# Patient Record
Sex: Female | Born: 1970
Health system: Southern US, Community
[De-identification: ages and names within clinical notes are randomized; demographics above are authoritative.]

## PROBLEM LIST (undated history)

## (undated) DIAGNOSIS — C801 Malignant (primary) neoplasm, unspecified: Secondary | ICD-10-CM

## (undated) DIAGNOSIS — Z9221 Personal history of antineoplastic chemotherapy: Secondary | ICD-10-CM

## (undated) DIAGNOSIS — K219 Gastro-esophageal reflux disease without esophagitis: Secondary | ICD-10-CM

## (undated) DIAGNOSIS — R002 Palpitations: Secondary | ICD-10-CM

## (undated) DIAGNOSIS — R0600 Dyspnea, unspecified: Secondary | ICD-10-CM

## (undated) DIAGNOSIS — T8859XA Other complications of anesthesia, initial encounter: Secondary | ICD-10-CM

## (undated) DIAGNOSIS — R7303 Prediabetes: Secondary | ICD-10-CM

## (undated) DIAGNOSIS — Z923 Personal history of irradiation: Secondary | ICD-10-CM

## (undated) DIAGNOSIS — E669 Obesity, unspecified: Secondary | ICD-10-CM

## (undated) DIAGNOSIS — E119 Type 2 diabetes mellitus without complications: Secondary | ICD-10-CM

## (undated) HISTORY — PX: BREAST LUMPECTOMY: SHX2

## (undated) HISTORY — PX: OTHER SURGICAL HISTORY: SHX169

## (undated) HISTORY — DX: Malignant (primary) neoplasm, unspecified: C80.1

## (undated) HISTORY — PX: DILATION AND CURETTAGE OF UTERUS: SHX78

## (undated) HISTORY — DX: Palpitations: R00.2

## (undated) HISTORY — PX: BREAST SURGERY: SHX581

## (undated) HISTORY — PX: CARDIAC CATHETERIZATION: SHX172

## (undated) HISTORY — PX: CHOLECYSTECTOMY: SHX55

## (undated) HISTORY — PX: HERNIA REPAIR: SHX51

---

## 1998-08-09 ENCOUNTER — Encounter: Admission: RE | Admit: 1998-08-09 | Discharge: 1998-11-07 | Payer: Self-pay | Admitting: Family Medicine

## 2007-09-08 ENCOUNTER — Other Ambulatory Visit: Admission: RE | Admit: 2007-09-08 | Discharge: 2007-09-08 | Payer: Self-pay | Admitting: Unknown Physician Specialty

## 2010-09-12 ENCOUNTER — Other Ambulatory Visit (HOSPITAL_COMMUNITY): Payer: Self-pay | Admitting: *Deleted

## 2010-09-12 ENCOUNTER — Other Ambulatory Visit (HOSPITAL_COMMUNITY): Payer: Self-pay | Admitting: Family Medicine

## 2010-09-12 DIAGNOSIS — Z139 Encounter for screening, unspecified: Secondary | ICD-10-CM

## 2010-09-13 ENCOUNTER — Ambulatory Visit (HOSPITAL_COMMUNITY)
Admission: RE | Admit: 2010-09-13 | Discharge: 2010-09-13 | Disposition: A | Discharge status: Home / Self Care | Payer: Self-pay | Source: Ambulatory Visit | Attending: *Deleted | Admitting: *Deleted

## 2010-09-13 DIAGNOSIS — Z1231 Encounter for screening mammogram for malignant neoplasm of breast: Secondary | ICD-10-CM | POA: Insufficient documentation

## 2010-09-13 DIAGNOSIS — Z139 Encounter for screening, unspecified: Secondary | ICD-10-CM

## 2010-09-14 ENCOUNTER — Other Ambulatory Visit: Payer: Self-pay | Admitting: Family Medicine

## 2010-09-14 DIAGNOSIS — R928 Other abnormal and inconclusive findings on diagnostic imaging of breast: Secondary | ICD-10-CM

## 2010-09-19 ENCOUNTER — Ambulatory Visit (HOSPITAL_COMMUNITY)
Admission: RE | Admit: 2010-09-19 | Discharge: 2010-09-19 | Disposition: A | Discharge status: Home / Self Care | Payer: PRIVATE HEALTH INSURANCE | Source: Ambulatory Visit | Attending: Family Medicine | Admitting: Family Medicine

## 2010-09-19 ENCOUNTER — Other Ambulatory Visit: Payer: Self-pay | Admitting: Family Medicine

## 2010-09-19 DIAGNOSIS — R928 Other abnormal and inconclusive findings on diagnostic imaging of breast: Secondary | ICD-10-CM

## 2010-09-19 DIAGNOSIS — Z803 Family history of malignant neoplasm of breast: Secondary | ICD-10-CM | POA: Insufficient documentation

## 2010-09-19 DIAGNOSIS — N63 Unspecified lump in unspecified breast: Secondary | ICD-10-CM | POA: Insufficient documentation

## 2010-09-21 ENCOUNTER — Other Ambulatory Visit (HOSPITAL_COMMUNITY): Payer: Self-pay | Admitting: Family Medicine

## 2010-09-21 DIAGNOSIS — N6310 Unspecified lump in the right breast, unspecified quadrant: Secondary | ICD-10-CM

## 2010-09-26 ENCOUNTER — Ambulatory Visit (HOSPITAL_COMMUNITY)
Admission: RE | Admit: 2010-09-26 | Discharge: 2010-09-26 | Disposition: A | Discharge status: Home / Self Care | Payer: PRIVATE HEALTH INSURANCE | Source: Ambulatory Visit | Attending: Family Medicine | Admitting: Family Medicine

## 2010-09-26 ENCOUNTER — Other Ambulatory Visit (HOSPITAL_COMMUNITY): Payer: Self-pay | Admitting: Family Medicine

## 2010-09-26 ENCOUNTER — Other Ambulatory Visit: Payer: Self-pay | Admitting: Radiology

## 2010-09-26 DIAGNOSIS — N6310 Unspecified lump in the right breast, unspecified quadrant: Secondary | ICD-10-CM

## 2010-09-26 DIAGNOSIS — N63 Unspecified lump in unspecified breast: Secondary | ICD-10-CM | POA: Insufficient documentation

## 2010-11-08 ENCOUNTER — Other Ambulatory Visit (HOSPITAL_COMMUNITY): Payer: Self-pay | Admitting: Family Medicine

## 2010-11-08 ENCOUNTER — Other Ambulatory Visit (HOSPITAL_COMMUNITY): Payer: Self-pay | Admitting: *Deleted

## 2010-11-08 DIAGNOSIS — R928 Other abnormal and inconclusive findings on diagnostic imaging of breast: Secondary | ICD-10-CM

## 2011-03-28 ENCOUNTER — Other Ambulatory Visit (HOSPITAL_COMMUNITY): Payer: Self-pay | Admitting: Family Medicine

## 2011-03-28 DIAGNOSIS — Z09 Encounter for follow-up examination after completed treatment for conditions other than malignant neoplasm: Secondary | ICD-10-CM

## 2011-04-17 ENCOUNTER — Ambulatory Visit (HOSPITAL_COMMUNITY)
Admission: RE | Admit: 2011-04-17 | Discharge: 2011-04-17 | Disposition: A | Discharge status: Home / Self Care | Payer: PRIVATE HEALTH INSURANCE | Source: Ambulatory Visit | Attending: Family Medicine | Admitting: Family Medicine

## 2011-04-17 ENCOUNTER — Other Ambulatory Visit (HOSPITAL_COMMUNITY): Payer: Self-pay | Admitting: Family Medicine

## 2011-04-17 DIAGNOSIS — Z09 Encounter for follow-up examination after completed treatment for conditions other than malignant neoplasm: Secondary | ICD-10-CM

## 2011-04-17 DIAGNOSIS — N63 Unspecified lump in unspecified breast: Secondary | ICD-10-CM | POA: Insufficient documentation

## 2011-10-18 ENCOUNTER — Other Ambulatory Visit (HOSPITAL_COMMUNITY): Payer: Self-pay | Admitting: Family Medicine

## 2011-10-18 DIAGNOSIS — Z09 Encounter for follow-up examination after completed treatment for conditions other than malignant neoplasm: Secondary | ICD-10-CM

## 2011-11-06 ENCOUNTER — Ambulatory Visit (HOSPITAL_COMMUNITY)
Admission: RE | Admit: 2011-11-06 | Discharge: 2011-11-06 | Disposition: A | Discharge status: Home / Self Care | Payer: PRIVATE HEALTH INSURANCE | Source: Ambulatory Visit | Attending: Family Medicine | Admitting: Family Medicine

## 2011-11-06 DIAGNOSIS — N63 Unspecified lump in unspecified breast: Secondary | ICD-10-CM | POA: Insufficient documentation

## 2011-11-06 DIAGNOSIS — Z09 Encounter for follow-up examination after completed treatment for conditions other than malignant neoplasm: Secondary | ICD-10-CM | POA: Insufficient documentation

## 2012-10-14 ENCOUNTER — Other Ambulatory Visit (HOSPITAL_COMMUNITY): Payer: Self-pay | Admitting: *Deleted

## 2012-10-14 DIAGNOSIS — Z1231 Encounter for screening mammogram for malignant neoplasm of breast: Secondary | ICD-10-CM

## 2012-11-09 ENCOUNTER — Ambulatory Visit (HOSPITAL_COMMUNITY): Payer: Self-pay

## 2012-11-09 ENCOUNTER — Ambulatory Visit (HOSPITAL_COMMUNITY)
Admission: RE | Admit: 2012-11-09 | Discharge: 2012-11-09 | Disposition: A | Discharge status: Home / Self Care | Payer: Self-pay | Source: Ambulatory Visit | Attending: *Deleted | Admitting: *Deleted

## 2012-11-09 DIAGNOSIS — Z1231 Encounter for screening mammogram for malignant neoplasm of breast: Secondary | ICD-10-CM

## 2014-01-25 ENCOUNTER — Other Ambulatory Visit (HOSPITAL_COMMUNITY): Payer: Self-pay | Admitting: *Deleted

## 2014-01-25 DIAGNOSIS — Z1231 Encounter for screening mammogram for malignant neoplasm of breast: Secondary | ICD-10-CM

## 2014-02-02 ENCOUNTER — Ambulatory Visit (HOSPITAL_COMMUNITY)
Admission: RE | Admit: 2014-02-02 | Discharge: 2014-02-02 | Disposition: A | Discharge status: Home / Self Care | Payer: Self-pay | Source: Ambulatory Visit | Attending: *Deleted | Admitting: *Deleted

## 2014-02-02 DIAGNOSIS — Z1231 Encounter for screening mammogram for malignant neoplasm of breast: Secondary | ICD-10-CM

## 2015-05-04 ENCOUNTER — Other Ambulatory Visit (HOSPITAL_COMMUNITY): Payer: Self-pay | Admitting: Nurse Practitioner

## 2015-05-04 DIAGNOSIS — Z1231 Encounter for screening mammogram for malignant neoplasm of breast: Secondary | ICD-10-CM

## 2015-05-11 ENCOUNTER — Ambulatory Visit (HOSPITAL_COMMUNITY)
Admission: RE | Admit: 2015-05-11 | Discharge: 2015-05-11 | Disposition: A | Discharge status: Home / Self Care | Payer: Self-pay | Source: Ambulatory Visit | Attending: Nurse Practitioner | Admitting: Nurse Practitioner

## 2015-05-11 DIAGNOSIS — Z1231 Encounter for screening mammogram for malignant neoplasm of breast: Secondary | ICD-10-CM

## 2016-03-19 ENCOUNTER — Encounter: Payer: Self-pay | Admitting: Physician Assistant

## 2016-03-19 ENCOUNTER — Ambulatory Visit (INDEPENDENT_AMBULATORY_CARE_PROVIDER_SITE_OTHER): Payer: Self-pay | Admitting: Physician Assistant

## 2016-03-19 VITALS — BP 132/88 | HR 91 | Temp 99.2°F | Ht 66.0 in | Wt 366.2 lb

## 2016-03-19 DIAGNOSIS — J309 Allergic rhinitis, unspecified: Secondary | ICD-10-CM

## 2016-03-19 DIAGNOSIS — E781 Pure hyperglyceridemia: Secondary | ICD-10-CM | POA: Insufficient documentation

## 2016-03-19 DIAGNOSIS — I10 Essential (primary) hypertension: Secondary | ICD-10-CM

## 2016-03-19 DIAGNOSIS — K219 Gastro-esophageal reflux disease without esophagitis: Secondary | ICD-10-CM

## 2016-03-19 DIAGNOSIS — R002 Palpitations: Secondary | ICD-10-CM

## 2016-03-19 MED ORDER — RANITIDINE HCL 300 MG PO TABS: 300.0000 mg | ORAL_TABLET | Freq: Every day | ORAL | 6 refills | Status: DC

## 2016-03-19 MED ORDER — FLUTICASONE PROPIONATE 50 MCG/ACT NA SUSP: 2.0000 | Freq: Every day | NASAL | 12 refills | Status: DC

## 2016-03-19 MED ORDER — LORATADINE 10 MG PO TABS: 10.0000 mg | ORAL_TABLET | Freq: Every day | ORAL | 11 refills | Status: DC

## 2016-03-19 MED ORDER — METOPROLOL TARTRATE 25 MG PO TABS: 25.0000 mg | ORAL_TABLET | Freq: Two times a day (BID) | ORAL | 6 refills | Status: DC

## 2016-03-19 NOTE — Patient Instructions (Signed)
Gluten-Free Diet for Celiac Disease Gluten is a protein found in wheat, rye, barley, and triticale (a cross between wheat and rye) grains. People with celiac disease need to have a gluten-free diet. With celiac disease, gluten interferes with the absorption of food and may also cause intestinal injury.  Strict compliance is important even during symptom-free periods. This means eliminating all foods with gluten from your diet permanently. This requires some significant changes but is very manageable. WHAT DO I NEED TO KNOW ABOUT A GLUTEN-FREE DIET?  Look for items labeled with "GF." Looking for GF will make it easier to identify products that are safe to eat.  Read all labels. Gluten may have been added as a minor ingredient where least expected, such as in shredded cheeses or ice creams. Always check food labels and investigate questionable ingredients. Talk to your dietitian or health care provider if you have questions about certain foods or need help finding GF foods.  Check when in doubt. If you are not sure whether an ingredient contains gluten, check with the manufacturer. Note that some manufacturers may change ingredients without notice. Always read labels.   Know how food is prepared. Since flour and cereal products are often used in the preparation of foods, it is important to be aware of the methods of preparation used, as well as the ingredients in the foods themselves. This is especially true when you are dining out. Ask restaurants if they have a gluten-free menu.  Watch for cross-contamination. Cross-contamination occurs when gluten-free foods come into contact with foods that contain gluten. It often happens during the manufacturing process. Always check the ingredient list and for warnings on packages, such as "may contain gluten."  Eat a balanced diet. It is important to still get enough fiber, iron, and B vitamins in your diet. Look for enriched whole grain gluten-free products  and continue to eat a well-balanced diet of the important non-grain items, such as vegetables, fruit, lean proteins, legumes, and dairy.  Consider taking a gluten-free multivitamin and mineral supplement. Discuss this with your health care provider. WHAT KEY WORDS HELP IDENTIFY GLUTEN? Know key words to help identify gluten. A dietitian can help you identify possible harmful ingredients in the foods you normally eat. Words to check for on food labels include:   Flour, enriched flour, bromated flour, white flour, durum flour, graham flour, phosphated flour, self-rising flour, semolina, or farina.  Starch, dextrin, modified food starch, or cereal.  Thickening, fillers, or emulsifiers.  Any kind of malt flavoring, extract, or syrup (malt is made from barley and includes malt vinegar, malted milk, and malted beverages).  Hydrolyzed vegetable protein. WHAT FOODS CAN I EAT? Below is a list of common foods that are allowed with a gluten-free diet.  Grains Products made from the following flours or grains:amaranth,bean flours, 100% buckwheat flour, corn, millet, nut flours or meals, GF oats, quinoa, rice, sorghum, teff, any all-purpose 100% GF flour mix, rice wafers, pure cornmeal tortillas, popcorn, some crackers, some chips, and hot cereals made from cornmeal. Ask your dietitian which specific hot and cold cereals are allowed. Hominy, rice or wild rice, and special GF pasta. Some Asian rice noodles or bean noodles. Arrowroot starch, corn bran, corn flour, corn germ, cornmeal, corn starch, potato flour, potato starch flour, and rice bran. Rice flours: plain, brown, and sweet. Rice polish, soy flour, tapioca starch. Vegetables All plain, fresh, frozen, or canned vegetables.  Fruits All fresh, frozen, canned, dried fruits, and fruit juices.  Meats and Other   Protein Foods Meat, fish, poultry, or eggs prepared without added wheat, rye, barley, or triticale. Some luncheon meat and some frankfurters.  Pure meat. All aged cheese, most processed cheese products, some cottage cheese, and some cream cheese. Dried beans, dried peas, and lentils.  Dairy Milk and yogurt made with allowed ingredients.  Beverages Coffee (regular or decaffeinated), tea, herbal tea (read label to be sure that no wheat flour has been added). Carbonated beverages and some root beers. Wine, sake, and distilled spirits, such as gin, vodka, and whiskey. GF beers and GF ciders.  Sweetsand Desserts Sugar, honey, some syrups, molasses, jelly, jam, plain hard candy, marshmallows, gumdrops, homemade candies free of wheat, rye, barley, or triticale. Coconut. Custard, some pudding mixes, and homemade puddings from cornstarch, rice, and tapioca. Gelatin desserts, sorbets, frozen ice pops, and sherbet. Cake, cookies, and other desserts prepared with allowed flours. Some commercial ice creams. Ask your dietitian about specific brands of dessert that are allowed.  Fats and Oils Butter, margarine, vegetable oil, sour cream not containing modified food starch, whipping cream, shortening, lard, cream, and some mayonnaise. Some commercial salad dressings. Peanut butter.  Other Homemade broth and soups made with allowed ingredients; some canned or frozen soups. Any other combination or prepared foods that do not contain gluten. Monosodium glutamate (MSG). Cider, rice, and wine vinegar. Baking soda and baking powder. Certain soy sauces (Tamari). Ask your dietitian about specific brands that are allowed. Nuts, coconut, chocolate, and pure cocoa powder. Salt, pepper, herbs, spices, extracts, and food colorings. The items listed above may not be a complete list of allowed foods or beverages. Contact your dietitian for more options.  WHAT FOODS CAN I NOT EAT? Below is a list of common foods that are not allowed with a gluten-free diet.  Grains Barley, bran, bulgur, cracked wheat, graham, malt, matzo, wheat germ, and all wheat and rye cereals  including spelt and kamut. Avoid cereals containing malt as a flavoring, such as rice cereal. Also avoid regular noodles, spaghetti, macaroni, and most packaged rice mixes, and all others containing wheat, rye, barley, or triticale.  Vegetables Most creamed vegetables, most vegetables canned in sauces, and any vegetables prepared with wheat, rye, barley, or triticale.  Fruits Thickened or prepared fruits and some pie fillings.  Meats and Other Protein Sources Any meat or meat alternative containing wheat, rye, barley, or gluten stabilizers (such as some hot dogs, salami, cold cuts, or sausage). Bread-containing products, such as Swiss steak, croquettes, and meatloaf. Most tuna canned in vegetable broth, turkey with hydrolyzed vegetable protein (HVP) injected as part of the basting, and any cheese product containing oat gum as an ingredient. Seitan. Imitation fish. Dairy Commercial chocolate milk, which may have cereal added, and malted milk. Beverages Certain cereal beverages. Beer and ciders (unless GF), ale, malted milk, and some root beers. Sweetsand Desserts Commercial candies containing wheat, rye, barley, or triticale. Certain toffees are dusted with wheat flour. Chocolate-coated nuts, which are often rolled in flour. Cakes, cookies, doughnuts, and pastries that are prepared with wheat, barley, rye, or triticale flour. Some commercial ice creams, ice cream flavors which contain cookies, crumbs, or cheesecake. Ice cream cones. Commercially prepared mixes for cakes, cookies, and other desserts unless marked GF. Bread pudding and other puddings thickened with flour. Fats and Oils Some commercial salad dressings and sour cream containing modified food starch.  Condiments Some curry powder, some dry seasoning mixes, some gravy extracts, some meat sauces, some ketchup, some prepared mustard, horseradish. Other All soups containing wheat,   rye, barley, or triticale flour. Bouillon and bouillon  cubes that contain HVP. Combination or prepared foods that contain gluten. Some soy sauce, some chip dips, and some chewing gum. Yeast extract (contains barley). Caramel color (may contain malt). The items listed above may not be a complete list of foods and beverages to avoid. Contact your dietitian for more information.   This information is not intended to replace advice given to you by your health care provider. Make sure you discuss any questions you have with your health care provider.   Document Released: 06/17/2005 Document Revised: 07/08/2014 Document Reviewed: 04/21/2013 Elsevier Interactive Patient Education 2016 Elsevier Inc.  

## 2016-03-19 NOTE — Progress Notes (Signed)
BP 132/88   Pulse 91   Temp 99.2 F (37.3 C) (Oral)   Ht 5\' 6"  (1.676 m)   Wt (!) 366 lb 3.2 oz (166.1 kg)   BMI 59.11 kg/m    Subjective:    Patient ID: Shelby [name], female    DOB: 1971/02/03, 45 y.o.   MRN: [id number]  Shelby [name] is a 45 y.o. female presenting on 03/19/2016 for New Patient (Initial Visit) (Patient states that she is 18 days late on her menstrual cycle. She states that she took 2 pregnancy test and they were negative); Ear Pain (Bilateral ); and Headache   HPI This is a new patient coming to be established at [facility]. She has been followed through the [place] and cardiology at [facility] which was [place]. When they left earlier in the year she did not need to be seen by another cardiologist.  She has been stable, though not doing any regular exercise.  We have set a goal fer her to start walking 10 minutes at a slow pace twice daily and in the next month increase weekly her time or her distance.  Also she will be eliminating gluten due to GERD, bloating and weight gain.  She has chronic allergic rhinitis but does not routinely take her claritin or flonase. Will refill them and take regularly.  Also has more palpitations but did lower her metoprolol to once daily and it is not the extend release. Explained that the medication is not staying at a steady concentration to cover her, in addtion to her cardiopulmonary decomposition from not exercising. Will adjust the med to BID and recheck in one month. Has taken 2 pregnancy tests at home that were negative and has taken her Birth control on very routine basis. She is having some vaginal dryness, mood fluctuations, and hot flashes. We discussed that she may be entering the perimenopausal time. She is due for her well exam next month at the [facility]. I've encouraged her to discuss that with them at that time. We have discussed what happens to treat the symptoms  of menopause. It can range from estrogen replacement to indications for depression in addition to medicines to help vasomotor symptoms. We will address these as time goes on.  Relevant past medical, surgical, family and social history reviewed and updated as indicated. Interim medical history since our last visit reviewed. Allergies and medications reviewed and updated.   Data reviewed from any sources in EPIC.  Review of Systems  Constitutional: Positive for unexpected weight change. Negative for fatigue and fever.  HENT: Positive for postnasal drip and sneezing.   Eyes: Positive for itching.  Respiratory: Positive for shortness of breath. Negative for cough.   Cardiovascular: Positive for palpitations. Negative for chest pain and leg swelling.  Gastrointestinal: Negative.  Negative for abdominal pain.  Endocrine: Negative.   Genitourinary: Negative.  Negative for dysuria.  Musculoskeletal: Negative.   Skin: Negative.   Neurological: Negative.   Psychiatric/Behavioral: Positive for agitation and dysphoric mood.    Per HPI unless specifically indicated above  Social History   Social History  . Marital status: Legally Separated    Spouse name: N/A  . Number of children: N/A  . Years of education: N/A   Occupational History  . Not on file.   Social History Main Topics  . Smoking status: Never Smoker  . Smokeless tobacco: Never Used  . Alcohol use No  . Drug use: No  .  Sexual activity: Not on file   Other Topics Concern  . Not on file   Social History Narrative  . No narrative on file    Past Surgical History:  Procedure Laterality Date  . CHOLECYSTECTOMY    . HERNIA REPAIR      Family History  Problem Relation Age of Onset  . Adopted: Yes      Medication List       Accurate as of 03/19/16  2:57 PM. Always use your most recent med list.          aspirin EC 81 MG tablet Take 81 mg by mouth daily.   Fish Oil 1000 MG Caps Take 1,000 mg by mouth 2 (two)  times daily.   fluticasone 50 MCG/ACT nasal spray Commonly known as:  FLONASE Place 2 sprays into both nostrils daily.   loratadine 10 MG tablet Commonly known as:  CLARITIN Take 1 tablet (10 mg total) by mouth daily.   metoprolol tartrate 25 MG tablet Commonly known as:  LOPRESSOR Take 1 tablet (25 mg total) by mouth 2 (two) times daily.   norethindrone 0.35 MG tablet Commonly known as:  MICRONOR,CAMILA,ERRIN Take by mouth.   ranitidine 300 MG tablet Commonly known as:  ZANTAC Take 1 tablet (300 mg total) by mouth at bedtime.          Objective:    BP 132/88   Pulse 91   Temp 99.2 F (37.3 C) (Oral)   Ht 5\' 6"  (1.676 m)   Wt (!) 366 lb 3.2 oz (166.1 kg)   BMI 59.11 kg/m   Allergies  Allergen Reactions  . Codeine Swelling   Wt Readings from Last 3 Encounters:  03/19/16 (!) 366 lb 3.2 oz (166.1 kg)    Physical Exam  Constitutional: She is oriented to person, place, and time. She appears well-developed.  Non-toxic appearance.  Morbidly obese female  HENT:  Head: Normocephalic and atraumatic.  Eyes: Conjunctivae and EOM are normal. Pupils are equal, round, and reactive to light.  Neck: Normal range of motion. Neck supple.  Cardiovascular: Normal rate, regular rhythm, normal heart sounds and intact distal pulses.   Pulmonary/Chest: Effort normal and breath sounds normal.  Abdominal: Soft. Bowel sounds are normal.  Neurological: She is alert and oriented to person, place, and time. She has normal reflexes.  Skin: Skin is warm and dry. No rash noted.  Psychiatric: She has a normal mood and affect. Her behavior is normal. Judgment and thought content normal.        Assessment & Plan:   1. Morbid obesity, unspecified obesity type (Shelby [name]) Walk daily and gluten free meal planning  2. Palpitations - metoprolol tartrate (LOPRESSOR) 25 MG tablet; Take 1 tablet (25 mg total) by mouth 2 (two) times daily.  Dispense: 60 tablet; Refill: 6  3. Essential  hypertension Continue meds  4. Allergic rhinitis, unspecified allergic rhinitis type - fluticasone (FLONASE) 50 MCG/ACT nasal spray; Place 2 sprays into both nostrils daily.  Dispense: 16 g; Refill: 12 - loratadine (CLARITIN) 10 MG tablet; Take 1 tablet (10 mg total) by mouth daily.  Dispense: 30 tablet; Refill: 11  5. Gastroesophageal reflux disease without esophagitis - ranitidine (ZANTAC) 300 MG tablet; Take 1 tablet (300 mg total) by mouth at bedtime.  Dispense: 30 tablet; Refill: 6   Continue all other maintenance medications as listed above. Educational handout given for gluten free meal planning  Follow up plan: Return in about 4 weeks (around 04/16/2016).  Terald Sleeper  PA-C [place] [address]  [place], [place] [address] [phone]   03/19/2016, 2:57 PM

## 2016-04-08 ENCOUNTER — Encounter: Payer: Self-pay | Admitting: *Deleted

## 2016-04-16 ENCOUNTER — Ambulatory Visit: Payer: Self-pay | Admitting: Physician Assistant

## 2016-04-19 ENCOUNTER — Encounter: Payer: Self-pay | Admitting: Physician Assistant

## 2016-04-19 ENCOUNTER — Ambulatory Visit (INDEPENDENT_AMBULATORY_CARE_PROVIDER_SITE_OTHER): Payer: Self-pay | Admitting: Physician Assistant

## 2016-04-19 VITALS — BP 135/91 | HR 64 | Temp 98.8°F | Ht 66.0 in | Wt 363.8 lb

## 2016-04-19 DIAGNOSIS — K219 Gastro-esophageal reflux disease without esophagitis: Secondary | ICD-10-CM

## 2016-04-19 DIAGNOSIS — R002 Palpitations: Secondary | ICD-10-CM

## 2016-04-19 DIAGNOSIS — F321 Major depressive disorder, single episode, moderate: Secondary | ICD-10-CM

## 2016-04-19 DIAGNOSIS — H6983 Other specified disorders of Eustachian tube, bilateral: Secondary | ICD-10-CM

## 2016-04-19 MED ORDER — RANITIDINE HCL 150 MG PO TABS: 150.0000 mg | ORAL_TABLET | Freq: Every day | ORAL | 6 refills | Status: DC

## 2016-04-19 MED ORDER — METOPROLOL TARTRATE 50 MG PO TABS: 50.0000 mg | ORAL_TABLET | Freq: Two times a day (BID) | ORAL | 6 refills | Status: DC

## 2016-04-19 MED ORDER — CITALOPRAM HYDROBROMIDE 20 MG PO TABS: 20.0000 mg | ORAL_TABLET | Freq: Every day | ORAL | 5 refills | Status: DC

## 2016-04-19 NOTE — Patient Instructions (Signed)

## 2016-04-19 NOTE — Progress Notes (Signed)
BP (!) 135/91   Pulse 64   Temp 98.8 F (37.1 C) (Oral)   Ht 5\' 6"  (1.676 m)   Wt (!) 363 lb 12.8 oz (165 kg)   BMI 58.72 kg/m    Subjective:    Patient ID: Shelby [name], female    DOB: 01-Sep-1970, 44 y.o.   MRN: [id number]  HPI: Shelby [name] is a 45 y.o. female presenting on 04/19/2016 for Follow-up (4 week recheck on. Sister passed away 1 month ago today and it was unexpected. )  This patient comes in for recheck today. She was started on allergy medication and Zantac for her GERD. She is taking 300 mg of Zantac and it is doing quite well. We have discussed that we may be able to reduce it to the 150 mg dose which would be more affordable. She is a cash pay patient. She also has a significant stressor in that her sister died about one month ago. She did have a history of diabetes and was on dialysis. She died very suddenly he felt that it was from cardiac arrest.Has not been on any type of antidepressant some time. We discussed that we may want to start this to help her through this time of mourning and grief. Depression screen [facility] 2/9 04/19/2016 03/19/2016  Decreased Interest 0 0  Down, Depressed, Hopeless 3 1  PHQ - 2 Score 3 1  Altered sleeping 3 -  Tired, decreased energy 0 -  Change in appetite 0 -  Feeling bad or failure about yourself  1 -  Trouble concentrating 3 -  Moving slowly or fidgety/restless 0 -  Suicidal thoughts 0 -  PHQ-9 Score 10 -     Relevant past medical, surgical, family and social history reviewed and updated as indicated. Allergies and medications reviewed and updated.  Past Medical History:  Diagnosis Date  . Heart palpitations     Past Surgical History:  Procedure Laterality Date  . CHOLECYSTECTOMY    . HERNIA REPAIR      Review of Systems  Constitutional: Negative.   HENT: Negative.   Eyes: Negative.   Respiratory: Negative.   Gastrointestinal: Negative.   Genitourinary: Negative.   Psychiatric/Behavioral: Positive for decreased  concentration, dysphoric mood and sleep disturbance. The patient is nervous/anxious.       Medication List       Accurate as of 04/19/16 11:59 PM. Always use your most recent med list.          aspirin EC 81 MG tablet Take 81 mg by mouth daily.   citalopram 20 MG tablet Commonly known as:  CELEXA Take 1 tablet (20 mg total) by mouth daily. For depression   Fish Oil 1000 MG Caps Take 1,000 mg by mouth 2 (two) times daily.   fluticasone 50 MCG/ACT nasal spray Commonly known as:  FLONASE Place 2 sprays into both nostrils daily.   loratadine 10 MG tablet Commonly known as:  CLARITIN Take 1 tablet (10 mg total) by mouth daily.   metoprolol 50 MG tablet Commonly known as:  LOPRESSOR Take 1 tablet (50 mg total) by mouth 2 (two) times daily.   norethindrone 0.35 MG tablet Commonly known as:  MICRONOR,CAMILA,ERRIN Take by mouth.   ranitidine 150 MG tablet Commonly known as:  ZANTAC Take 1 tablet (150 mg total) by mouth at bedtime.          Objective:    BP (!) 135/91   Pulse 64   Temp 98.8 F (37.1  C) (Oral)   Ht 5\' 6"  (1.676 m)   Wt (!) 363 lb 12.8 oz (165 kg)   BMI 58.72 kg/m   Allergies  Allergen Reactions  . Codeine Swelling    Physical Exam  Constitutional: She is oriented to person, place, and time. She appears well-developed and well-nourished.  HENT:  Head: Normocephalic and atraumatic.  Eyes: Conjunctivae and EOM are normal. Pupils are equal, round, and reactive to light.  Cardiovascular: Normal rate, regular rhythm, normal heart sounds and intact distal pulses.   Pulmonary/Chest: Effort normal and breath sounds normal.  Abdominal: Soft. Bowel sounds are normal.  Neurological: She is alert and oriented to person, place, and time. She has normal reflexes.  Skin: Skin is warm and dry. No rash noted.  Psychiatric: She has a normal mood and affect. Her behavior is normal. Judgment and thought content normal.  Nursing note and vitals  reviewed.   No results found for this or any previous visit.    Assessment & Plan:   1. Palpitations - metoprolol tartrate (LOPRESSOR) 50 MG tablet; Take 1 tablet (50 mg total) by mouth 2 (two) times daily.  Dispense: 60 tablet; Refill: 6  2. Gastroesophageal reflux disease without esophagitis - ranitidine (ZANTAC) 150 MG tablet; Take 1 tablet (150 mg total) by mouth at bedtime.  Dispense: 30 tablet; Refill: 6  3. Eustachian tube dysfunction, bilateral  4. Moderate single current episode of major depressive disorder (HCC) - citalopram (CELEXA) 20 MG tablet; Take 1 tablet (20 mg total) by mouth daily. For depression  Dispense: 30 tablet; Refill: 5   Continue all other maintenance medications as listed above.  Follow up plan: Return in about 6 months (around 10/18/2016).  Educational handout given for perimenopause  Terald Sleeper PA-C [place] [address]  [place], [place] [address] [phone]   04/22/2016, 11:24 AM

## 2016-04-22 DIAGNOSIS — K219 Gastro-esophageal reflux disease without esophagitis: Secondary | ICD-10-CM | POA: Insufficient documentation

## 2016-04-22 DIAGNOSIS — H6983 Other specified disorders of Eustachian tube, bilateral: Secondary | ICD-10-CM | POA: Insufficient documentation

## 2016-04-22 DIAGNOSIS — F321 Major depressive disorder, single episode, moderate: Secondary | ICD-10-CM | POA: Insufficient documentation

## 2016-05-07 ENCOUNTER — Other Ambulatory Visit (HOSPITAL_COMMUNITY): Payer: Self-pay | Admitting: Nurse Practitioner

## 2016-05-07 DIAGNOSIS — Z1231 Encounter for screening mammogram for malignant neoplasm of breast: Secondary | ICD-10-CM

## 2016-05-13 ENCOUNTER — Encounter: Payer: Self-pay | Admitting: Obstetrics and Gynecology

## 2016-05-13 ENCOUNTER — Ambulatory Visit (INDEPENDENT_AMBULATORY_CARE_PROVIDER_SITE_OTHER): Payer: Self-pay | Admitting: Obstetrics and Gynecology

## 2016-05-13 VITALS — BP 122/94 | HR 68 | Ht 65.0 in | Wt 370.4 lb

## 2016-05-13 DIAGNOSIS — N909 Noninflammatory disorder of vulva and perineum, unspecified: Secondary | ICD-10-CM

## 2016-05-13 DIAGNOSIS — N926 Irregular menstruation, unspecified: Secondary | ICD-10-CM

## 2016-05-13 DIAGNOSIS — T148XXA Other injury of unspecified body region, initial encounter: Secondary | ICD-10-CM | POA: Insufficient documentation

## 2016-05-13 DIAGNOSIS — L929 Granulomatous disorder of the skin and subcutaneous tissue, unspecified: Secondary | ICD-10-CM
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
## 2016-05-16 ENCOUNTER — Ambulatory Visit (HOSPITAL_COMMUNITY)
Admission: RE | Admit: 2016-05-16 | Discharge: 2016-05-16 | Disposition: A | Discharge status: Home / Self Care | Payer: Self-pay | Source: Ambulatory Visit | Attending: Nurse Practitioner | Admitting: Nurse Practitioner

## 2016-05-16 DIAGNOSIS — Z1231 Encounter for screening mammogram for malignant neoplasm of breast: Secondary | ICD-10-CM

## 2016-05-20 ENCOUNTER — Ambulatory Visit (INDEPENDENT_AMBULATORY_CARE_PROVIDER_SITE_OTHER): Payer: Self-pay | Admitting: Obstetrics and Gynecology

## 2016-05-20 ENCOUNTER — Other Ambulatory Visit: Payer: Self-pay | Admitting: Obstetrics and Gynecology

## 2016-05-20 ENCOUNTER — Encounter: Payer: Self-pay | Admitting: Obstetrics and Gynecology

## 2016-05-20 VITALS — BP 124/90 | HR 77 | Ht 65.0 in | Wt 365.0 lb

## 2016-05-20 DIAGNOSIS — N9089 Other specified noninflammatory disorders of vulva and perineum: Secondary | ICD-10-CM

## 2016-05-20 DIAGNOSIS — N763 Subacute and chronic vulvitis: Secondary | ICD-10-CM

## 2016-05-20 NOTE — Progress Notes (Signed)
[facility] Visit  @DATE @            Patient name: Shelby [name] MRN [id number]  Date of birth: September 14, 1970  CC & HPI:  Shelby [name] is a 45 y.o. female presenting today for trimming of an area of non-healing granulation tissue to the left inguinal crease. She states the area has intermittent sensitivity which is worse with touching or irritation of the area. She states the area has been there for a few months. She was also advised to bring record of periods from last 6 months at next week to further discuss history of irregular menses.   ROS:  ROS +labial lesion  Pertinent History Reviewed:   Reviewed: Significant for  Medical         Past Medical History:  Diagnosis Date   Heart palpitations                               Surgical Hx:    Past Surgical History:  Procedure Laterality Date   CHOLECYSTECTOMY     HERNIA REPAIR     Medications: Reviewed & Updated - see associated section                       Current Outpatient Prescriptions:    aspirin EC 81 MG tablet, Take 81 mg by mouth daily., Disp: , Rfl:    citalopram (CELEXA) 20 MG tablet, Take 1 tablet (20 mg total) by mouth daily. For depression, Disp: 30 tablet, Rfl: 5   fluticasone (FLONASE) 50 MCG/ACT nasal spray, Place 2 sprays into both nostrils daily., Disp: 16 g, Rfl: 12   loratadine (CLARITIN) 10 MG tablet, Take 1 tablet (10 mg total) by mouth daily., Disp: 30 tablet, Rfl: 11   metoprolol tartrate (LOPRESSOR) 50 MG tablet, Take 1 tablet (50 mg total) by mouth 2 (two) times daily., Disp: 60 tablet, Rfl: 6   norethindrone (MICRONOR,CAMILA,ERRIN) 0.35 MG tablet, Take by mouth., Disp: , Rfl:    Omega-3 Fatty Acids (FISH OIL) 1000 MG CAPS, Take 1,000 mg by mouth 2 (two) times daily., Disp: , Rfl:    ranitidine (ZANTAC) 150 MG tablet, Take 1 tablet (150 mg total) by mouth at bedtime., Disp: 30 tablet, Rfl: 6   Social History: Reviewed -  reports that she has never smoked. She has never used smokeless  tobacco.  Objective Findings:  Vitals: Blood pressure 124/90, pulse 77, height 5\' 5"  (1.651 m), weight (!) 365 lb (165.6 kg), last menstrual period 05/13/2016.  Physical Examination: General appearance - alert, well appearing, and in no distress Pelvic - VULVA: 1. 5 cm long ellipse shaped non-healing ulceration to left inguinal crease  VULVAR LESION EXCISION PROCEDURE NOTE The patient's identification was confirmed and consent was obtained. This procedure was performed by Jonnie Kind at 9:35 AM Site & Appearance: 1. 5 cm long ellipse shaped non-healing ulceration to left inguinal crease Sterile procedures observed Anesthetic used: 2% lidocaine with epi, 10 cc used A 1.5 cm long and 1 cm deep area excised under local anesthesia, and covered with dry, sterile dressing.  Patient tolerated procedure well without complications. Minimal blood loss.   LACERATION REPAIR PROCEDURE NOTE The patient's identification was confirmed and consent was obtained. This procedure was performed by Jonnie Kind, MD at 9:35 AM. Site: left labia Sterile procedures observed Suture type/size:4-0 Prolene Length: 1.5 cm Single layer closure, continuous running  Complexity: simple Antibx ointment applied Site covered with dry, sterile dressing.  Patient tolerated procedure well without complications. Instructions for care discussed verbally and patient provided with additional written instructions for homecare and f/u.   Assessment & Plan:   A:  1. Vulvar ulcer  P:  1. Start Keflex prophylaxis x 5 days  2. Follow up MyChart for results and in 2 weeks for suture removal    By signing my name below, I, Sonum Patel, attest that this documentation has been prepared under the direction and in the presence of Jonnie Kind, MD. Electronically Signed: Sonum Patel, Education administrator. 05/20/16. 9:13 AM.  I personally performed the services described in this documentation, which was SCRIBED in my presence. The  recorded information has been reviewed and considered accurate. It has been edited as necessary during review. Jonnie Kind, MD

## 2016-05-21 MED ORDER — CEPHALEXIN 500 MG PO CAPS: 500.0000 mg | ORAL_CAPSULE | Freq: Four times a day (QID) | ORAL | 0 refills | Status: DC

## 2016-05-21 NOTE — Progress Notes (Signed)
[facility] Visit  @DATE @            Patient name: Shelby [name] MRN [id number]  Date of birth: 08/02/1970  CC & HPI:  Shelby [name] is a 45 y.o. female presenting today for trimming of an area of non-healing granulation tissue to the left inguinal crease. She states the area has intermittent sensitivity which is worse with touching or irritation of the area. She states the area has been there for a few months. She was also advised to bring record of periods from last 6 months at next week to further discuss history of irregular menses.   ROS:  ROS +labial lesion  Pertinent History Reviewed:   Reviewed: Significant for  Medical         Past Medical History:  Diagnosis Date  . Heart palpitations                               Surgical Hx:    Past Surgical History:  Procedure Laterality Date  . CHOLECYSTECTOMY    . HERNIA REPAIR     Medications: Reviewed & Updated - see associated section                       Current Outpatient Prescriptions:  .  aspirin EC 81 MG tablet, Take 81 mg by mouth daily., Disp: , Rfl:  .  citalopram (CELEXA) 20 MG tablet, Take 1 tablet (20 mg total) by mouth daily. For depression, Disp: 30 tablet, Rfl: 5 .  fluticasone (FLONASE) 50 MCG/ACT nasal spray, Place 2 sprays into both nostrils daily., Disp: 16 g, Rfl: 12 .  loratadine (CLARITIN) 10 MG tablet, Take 1 tablet (10 mg total) by mouth daily., Disp: 30 tablet, Rfl: 11 .  metoprolol tartrate (LOPRESSOR) 50 MG tablet, Take 1 tablet (50 mg total) by mouth 2 (two) times daily., Disp: 60 tablet, Rfl: 6 .  norethindrone (MICRONOR,CAMILA,ERRIN) 0.35 MG tablet, Take by mouth., Disp: , Rfl:  .  Omega-3 Fatty Acids (FISH OIL) 1000 MG CAPS, Take 1,000 mg by mouth 2 (two) times daily., Disp: , Rfl:  .  ranitidine (ZANTAC) 150 MG tablet, Take 1 tablet (150 mg total) by mouth at bedtime., Disp: 30 tablet, Rfl: 6   Social History: Reviewed -  reports that she has never smoked. She has never used smokeless  tobacco.  Objective Findings:  Vitals: Blood pressure 124/90, pulse 77, height 5\' 5"  (1.651 m), weight (!) 365 lb (165.6 kg), last menstrual period 05/13/2016.  Physical Examination: General appearance - alert, well appearing, and in no distress Pelvic - VULVA: 1. 5 cm long ellipse shaped non-healing ulceration to left inguinal crease  VULVAR LESION EXCISION PROCEDURE NOTE The patient's identification was confirmed and consent was obtained. This procedure was performed by Jonnie Kind at 9:35 AM Site & Appearance: 1. 5 cm long ellipse shaped non-healing ulceration to left inguinal crease Sterile procedures observed Anesthetic used: 2% lidocaine with epi, 10 cc used A 1.5 cm long and 1 cm deep area excised under local anesthesia, and covered with dry, sterile dressing.  Patient tolerated procedure well without complications. Minimal blood loss.   LACERATION REPAIR PROCEDURE NOTE The patient's identification was confirmed and consent was obtained. This procedure was performed by Jonnie Kind, MD at 9:35 AM. Site: left labia Sterile procedures observed Suture type/size:4-0 Prolene Length: 1.5 cm Single layer closure, continuous running Complexity:  simple Antibx ointment applied Site covered with dry, sterile dressing.  Patient tolerated procedure well without complications. Instructions for care discussed verbally and patient provided with additional written instructions for homecare and f/u.   Assessment & Plan:   A:  1. Vulvar ulcer  P:  1. Start Keflex prophylaxis x 5 days  2. Follow up MyChart for results and in 2 weeks for suture removal    By signing my name below, I, Sonum Patel, attest that this documentation has been prepared under the direction and in the presence of Jonnie Kind, MD. Electronically Signed: Sonum Patel, Education administrator. 05/20/16. 9:13 AM.  I personally performed the services described in this documentation, which was SCRIBED in my presence. The  recorded information has been reviewed and considered accurate. It has been edited as necessary during review. Jonnie Kind, MD

## 2016-05-30 ENCOUNTER — Ambulatory Visit (INDEPENDENT_AMBULATORY_CARE_PROVIDER_SITE_OTHER): Payer: Self-pay | Admitting: Obstetrics and Gynecology

## 2016-05-30 ENCOUNTER — Ambulatory Visit: Payer: Self-pay | Admitting: Obstetrics and Gynecology

## 2016-05-30 ENCOUNTER — Encounter: Payer: Self-pay | Admitting: Obstetrics and Gynecology

## 2016-05-30 VITALS — BP 120/84 | HR 66 | Ht 65.0 in | Wt 364.4 lb

## 2016-05-30 DIAGNOSIS — Z6841 Body Mass Index (BMI) 40.0 and over, adult: Secondary | ICD-10-CM

## 2016-05-30 DIAGNOSIS — Z4802 Encounter for removal of sutures: Secondary | ICD-10-CM

## 2016-05-30 NOTE — Progress Notes (Addendum)
[facility] Visit  05/30/16            Patient name: Shelby [name] MRN [id number]  Date of birth: [birth year]  CC & HPI:  CARLINDA [name] is a 45 y.o. female presenting today for follow up after excision of vulvar lesion. She was seen in the office on 05/20/16 to have this completed. She denies any issues with this procedure aside from mild soreness.  Here for suture removal ROS:  ROS +mild soreness from excision site Otherwise negative   Pertinent History Reviewed:   Reviewed: Significant for Medical         Past Medical History:  Diagnosis Date  . Heart palpitations                               Surgical Hx:    Past Surgical History:  Procedure Laterality Date  . CHOLECYSTECTOMY    . HERNIA REPAIR     Medications: Reviewed & Updated - see associated section                       Current Outpatient Prescriptions:  .  aspirin EC 81 MG tablet, Take 81 mg by mouth daily., Disp: , Rfl:  .  cephALEXin (KEFLEX) 500 MG capsule, Take 1 capsule (500 mg total) by mouth 4 (four) times daily., Disp: 28 capsule, Rfl: 0 .  citalopram (CELEXA) 20 MG tablet, Take 1 tablet (20 mg total) by mouth daily. For depression, Disp: 30 tablet, Rfl: 5 .  fluticasone (FLONASE) 50 MCG/ACT nasal spray, Place 2 sprays into both nostrils daily., Disp: 16 g, Rfl: 12 .  loratadine (CLARITIN) 10 MG tablet, Take 1 tablet (10 mg total) by mouth daily., Disp: 30 tablet, Rfl: 11 .  metoprolol tartrate (LOPRESSOR) 50 MG tablet, Take 1 tablet (50 mg total) by mouth 2 (two) times daily., Disp: 60 tablet, Rfl: 6 .  norethindrone (MICRONOR,CAMILA,ERRIN) 0.35 MG tablet, Take by mouth., Disp: , Rfl:  .  Omega-3 Fatty Acids (FISH OIL) 1000 MG CAPS, Take 1,000 mg by mouth 2 (two) times daily., Disp: , Rfl:  .  ranitidine (ZANTAC) 150 MG tablet, Take 1 tablet (150 mg total) by mouth at bedtime., Disp: 30 tablet, Rfl: 6   Social History: Reviewed -  reports that she has never smoked. She has never used smokeless  tobacco.  Objective Findings:  Vitals: Blood pressure 120/84, pulse 66, height 5\' 5"  (1.651 m), weight (!) 364 lb 6.4 oz (165.3 kg), last menstrual period 05/13/2016. Body mass index is 60.64 kg/m.   SUTURE REMOVAL Performed by: Jonnie Kind, MD Authorized by: Jonnie Kind, MD Consent: Verbal consent obtained. Consent given by: patient Required items: required blood products, implants, devices, and special equipment available  Time out: Immediately prior to procedure a "time out" was called to verify the correct patient, procedure, equipment, support staff and site/side marked as required. Location: left inguinal crease Wound Appearance: clean, healing well  Sutures Removed: 1 continuous row  Patient tolerance: Patient tolerated the procedure well with no immediate complications.   Discussion: Discussed need to lose weight to improve overall health and given exercise options.   At end of discussion, pt had opportunity to ask questions and has no further questions at this time.   Specific discussion of weight loss as noted above. Greater than 50% was spent in counseling and coordination of care with the patient.  Discussion: Discussed with pt weight loss methods including food measurement and calorie counting using apps such as MyNetDiary or My Fitness Pal. Recommended the use of measuring cups and spoons to monitor serving sizes. Encouraged adequate daily water intake, especially prior to meals to eliminate overeating. Additionally encouraged pt to become more active by taking daily walks of at least 30 minute duration, join a local gym such as the [facility] or attend water aerobics classes. Also advised pt to use pedometer on smartphone or utilize a smartband such as FitBit to keep track of daily activity.   At end of discussion, pt had opportunity to ask questions and has no further questions at this time.   Specific discussion of lifestyle changes, behavioral modifications and  healthy eating habits as noted above. Greater than 50% was spent in counseling and coordination of care with the patient.     At end of discussion, pt had opportunity to ask questions and has no further questions at this time.   .  Total time greater than: 15 minutes.    Assessment & Plan:   A:  1. Suture removal  P:  1. Follow up PRN     By signing my name below, I, Sonum Patel, attest that this documentation has been prepared under the direction and in the presence of Jonnie Kind, MD. Electronically Signed: Sonum Patel, Education administrator. 05/30/16. 1:52 PM.  I personally performed the services described in this documentation, which was SCRIBED in my presence. The recorded information has been reviewed and considered accurate. It has been edited as necessary during review. Jonnie Kind, MD

## 2016-06-05 ENCOUNTER — Ambulatory Visit: Payer: Self-pay | Admitting: Obstetrics and Gynecology

## 2016-10-17 ENCOUNTER — Ambulatory Visit: Payer: Self-pay | Admitting: Physician Assistant

## 2016-10-18 ENCOUNTER — Encounter: Payer: Self-pay | Admitting: Physician Assistant

## 2016-12-02 ENCOUNTER — Other Ambulatory Visit: Payer: Self-pay | Admitting: Physician Assistant

## 2016-12-02 DIAGNOSIS — K219 Gastro-esophageal reflux disease without esophagitis: Secondary | ICD-10-CM

## 2017-08-12 ENCOUNTER — Ambulatory Visit: Payer: Self-pay | Admitting: Family Medicine

## 2017-08-13 ENCOUNTER — Encounter: Payer: Self-pay | Admitting: Physician Assistant

## 2017-08-18 ENCOUNTER — Ambulatory Visit (INDEPENDENT_AMBULATORY_CARE_PROVIDER_SITE_OTHER): Payer: Self-pay | Admitting: Physician Assistant

## 2017-08-18 ENCOUNTER — Encounter: Payer: Self-pay | Admitting: Physician Assistant

## 2017-08-18 VITALS — BP 127/79 | HR 75 | Temp 97.8°F | Ht 65.0 in | Wt 363.4 lb

## 2017-08-18 DIAGNOSIS — R002 Palpitations: Secondary | ICD-10-CM

## 2017-08-18 DIAGNOSIS — K219 Gastro-esophageal reflux disease without esophagitis: Secondary | ICD-10-CM

## 2017-08-18 DIAGNOSIS — J309 Allergic rhinitis, unspecified: Secondary | ICD-10-CM | POA: Insufficient documentation

## 2017-08-18 DIAGNOSIS — J301 Allergic rhinitis due to pollen: Secondary | ICD-10-CM

## 2017-08-18 DIAGNOSIS — F419 Anxiety disorder, unspecified: Secondary | ICD-10-CM | POA: Insufficient documentation

## 2017-08-18 MED ORDER — RANITIDINE HCL 150 MG PO TABS: 150.0000 mg | ORAL_TABLET | Freq: Every day | ORAL | 11 refills | Status: DC

## 2017-08-18 MED ORDER — METOPROLOL TARTRATE 50 MG PO TABS: 50.0000 mg | ORAL_TABLET | Freq: Two times a day (BID) | ORAL | 6 refills | Status: DC

## 2017-08-18 MED ORDER — ALPRAZOLAM 0.5 MG PO TABS: 0.5000 mg | ORAL_TABLET | Freq: Every evening | ORAL | 2 refills | Status: DC | PRN

## 2017-08-18 MED ORDER — FLUTICASONE PROPIONATE 50 MCG/ACT NA SUSP: 2.0000 | Freq: Every day | NASAL | 11 refills | Status: DC

## 2017-08-18 MED ORDER — LORATADINE 10 MG PO TABS: 10.0000 mg | ORAL_TABLET | Freq: Every day | ORAL | 11 refills | Status: DC

## 2017-08-18 NOTE — Progress Notes (Signed)
BP 127/79   Pulse 75   Temp 97.8 F (36.6 C) (Oral)   Ht 5\' 5"  (1.651 m)   Wt (!) 363 lb 6.4 oz (164.8 kg)   BMI 60.47 kg/m    Subjective:    Patient ID: Shelby [name], female    DOB: [birth year], 47 y.o.   MRN: [id number]  HPI: Shelby [name] is a 47 y.o. female presenting on 08/18/2017 for Follow-up (Medication refill )  Patient comes in for recheck on her medications.  She is still having a significant amount of anxiety and depression.  There is a lot of family stressors going on.  Her allergies are quite bad.  She is also having reflux.  Given her the number for [employer] [facility] to work with a Social worker.    Past Medical History:  Diagnosis Date  . Heart palpitations    Relevant past medical, surgical, family and social history reviewed and updated as indicated. Interim medical history since our last visit reviewed. Allergies and medications reviewed and updated. DATA REVIEWED: CHART IN EPIC  Family History reviewed for pertinent findings.  Review of Systems  Constitutional: Positive for fatigue. Negative for activity change and fever.  HENT: Negative.   Eyes: Negative.   Respiratory: Negative.  Negative for cough.   Cardiovascular: Negative.  Negative for chest pain.  Gastrointestinal: Negative.  Negative for abdominal pain.  Endocrine: Negative.   Genitourinary: Negative.  Negative for dysuria.  Musculoskeletal: Negative.   Skin: Negative.   Neurological: Positive for headaches.  Psychiatric/Behavioral: Positive for dysphoric mood. Negative for sleep disturbance and suicidal ideas. The patient is nervous/anxious.     Allergies as of 08/18/2017      Reactions   Codeine Swelling      Medication List        Accurate as of 08/18/17  9:26 PM. Always use your most recent med list.          ALPRAZolam 0.5 MG tablet Commonly known as:  XANAX Take 1 tablet (0.5 mg total) by mouth at bedtime as needed for anxiety.   aspirin EC 81 MG tablet Take 81 mg by mouth  daily.   citalopram 20 MG tablet Commonly known as:  CELEXA Take 1 tablet (20 mg total) by mouth daily. For depression   Fish Oil 1000 MG Caps Take 1,000 mg by mouth 2 (two) times daily.   fluticasone 50 MCG/ACT nasal spray Commonly known as:  FLONASE Place 2 sprays into both nostrils daily.   loratadine 10 MG tablet Commonly known as:  CLARITIN Take 1 tablet (10 mg total) by mouth daily.   metoprolol tartrate 50 MG tablet Commonly known as:  LOPRESSOR Take 1 tablet (50 mg total) by mouth 2 (two) times daily.   norethindrone 0.35 MG tablet Commonly known as:  MICRONOR,CAMILA,ERRIN Take by mouth.   ranitidine 150 MG tablet Commonly known as:  ZANTAC Take 1 tablet (150 mg total) by mouth at bedtime.          Objective:    BP 127/79   Pulse 75   Temp 97.8 F (36.6 C) (Oral)   Ht 5\' 5"  (1.651 m)   Wt (!) 363 lb 6.4 oz (164.8 kg)   BMI 60.47 kg/m   Allergies  Allergen Reactions  . Codeine Swelling    Wt Readings from Last 3 Encounters:  08/18/17 (!) 363 lb 6.4 oz (164.8 kg)  05/30/16 (!) 364 lb 6.4 oz (165.3 kg)  05/20/16 (!) 365 lb (165.6 kg)  Physical Exam  Constitutional: She is oriented to person, place, and time. She appears well-developed and well-nourished.  HENT:  Head: Normocephalic and atraumatic.  Right Ear: Tympanic membrane, external ear and ear canal normal.  Left Ear: Tympanic membrane, external ear and ear canal normal.  Nose: Nose normal. No rhinorrhea.  Mouth/Throat: Oropharynx is clear and moist and mucous membranes are normal. No oropharyngeal exudate or posterior oropharyngeal erythema.  Eyes: Conjunctivae and EOM are normal. Pupils are equal, round, and reactive to light.  Neck: Normal range of motion. Neck supple.  Cardiovascular: Normal rate, regular rhythm, normal heart sounds and intact distal pulses.  Pulmonary/Chest: Effort normal and breath sounds normal.  Abdominal: Soft. Bowel sounds are normal.  Neurological: She is alert  and oriented to person, place, and time. She has normal reflexes.  Skin: Skin is warm and dry. No rash noted.  Psychiatric: She has a normal mood and affect. Her behavior is normal. Judgment and thought content normal.  Nursing note and vitals reviewed.   No results found for this or any previous visit.    Assessment & Plan:   1. Allergic rhinitis - fluticasone (FLONASE) 50 MCG/ACT nasal spray; Place 2 sprays into both nostrils daily.  Dispense: 16 g; Refill: 11 - loratadine (CLARITIN) 10 MG tablet; Take 1 tablet (10 mg total) by mouth daily.  Dispense: 30 tablet; Refill: 11  2. Palpitations - metoprolol tartrate (LOPRESSOR) 50 MG tablet; Take 1 tablet (50 mg total) by mouth 2 (two) times daily.  Dispense: 60 tablet; Refill: 6  3. Gastroesophageal reflux disease without esophagitis - ranitidine (ZANTAC) 150 MG tablet; Take 1 tablet (150 mg total) by mouth at bedtime.  Dispense: 30 tablet; Refill: 11  4. Anxiety - ALPRAZolam (XANAX) 0.5 MG tablet; Take 1 tablet (0.5 mg total) by mouth at bedtime as needed for anxiety.  Dispense: 40 tablet; Refill: 2   Continue all other maintenance medications as listed above.  Follow up plan: Return in about 3 months (around 11/15/2017) for recheck.  Educational handout given for [place] PA-C [facility] [address]  [place], [place] [address] [phone]   08/18/2017, 9:26 PM

## 2017-08-18 NOTE — Patient Instructions (Addendum)
[facility] [phone] [facility]  In a few days you may receive a survey in the mail or online from [employer] regarding your visit with [place] today. Please take a moment to fill this out. Your feedback is very important to our whole office. It can help [place] better understand your needs as well as improve your experience and satisfaction. Thank you for taking your time to complete it. We care about you.  Particia Nearing, PA-C

## 2017-09-29 ENCOUNTER — Encounter: Payer: Self-pay | Admitting: Family

## 2017-09-29 ENCOUNTER — Ambulatory Visit (INDEPENDENT_AMBULATORY_CARE_PROVIDER_SITE_OTHER): Payer: Self-pay | Admitting: Family

## 2017-09-29 VITALS — BP 138/82 | HR 78 | Temp 97.1°F | Ht 65.0 in | Wt 368.4 lb

## 2017-09-29 DIAGNOSIS — A084 Viral intestinal infection, unspecified: Secondary | ICD-10-CM

## 2017-09-29 DIAGNOSIS — J301 Allergic rhinitis due to pollen: Secondary | ICD-10-CM

## 2017-09-29 MED ORDER — ONDANSETRON HCL 4 MG PO TABS: 4.0000 mg | ORAL_TABLET | Freq: Three times a day (TID) | ORAL | 0 refills | Status: DC | PRN

## 2017-09-29 NOTE — Progress Notes (Signed)
   Subjective:    Patient ID: Shelby [name], female    DOB: Nov 02, 1970, 47 y.o.   MRN: [id number]  Diarrhea   This is a new problem. The current episode started yesterday. The problem occurs 5 to 10 times per day. Associated symptoms include bloating, chills, increased flatus, myalgias and vomiting. Risk factors include ill contacts.  Emesis   This is a new problem. The current episode started yesterday. The problem occurs 2 to 4 times per day. The problem has been unchanged. The emesis has an appearance of stomach contents. Associated symptoms include chills, diarrhea and myalgias. Risk factors include ill contacts.  Otalgia   There is pain in both ears. This is a new problem. The current episode started today. The problem occurs constantly. The problem has been waxing and waning. Associated symptoms include diarrhea and vomiting.      Review of Systems  Constitutional: Positive for chills.  HENT: Positive for ear pain.   Gastrointestinal: Positive for bloating, diarrhea, flatus and vomiting.  Musculoskeletal: Positive for myalgias.  All other systems reviewed and are negative.      Objective:   Physical Exam  Constitutional: She is oriented to person, place, and time. She appears well-developed and well-nourished. No distress.  Morbid obese   HENT:  Head: Normocephalic and atraumatic.  Right Ear: External ear normal.  Left Ear: External ear normal.  Nose: Mucosal edema and rhinorrhea present.  Eyes: Pupils are equal, round, and reactive to light.  Neck: Normal range of motion. Neck supple. No thyromegaly present.  Cardiovascular: Normal rate, regular rhythm, normal heart sounds and intact distal pulses.  No murmur heard. Pulmonary/Chest: Effort normal and breath sounds normal. No respiratory distress. She has no wheezes.  Abdominal: Soft. Bowel sounds are normal. She exhibits no distension. There is no tenderness.  Musculoskeletal: Normal range of motion. She exhibits no edema  or tenderness.  Neurological: She is alert and oriented to person, place, and time.  Skin: Skin is warm and dry.  Psychiatric: She has a normal mood and affect. Her behavior is normal. Judgment and thought content normal.  Vitals reviewed.    BP 138/82   Pulse 78   Temp (!) 97.1 F (36.2 C) (Oral)   Ht 5\' 5"  (1.651 m)   Wt (!) 368 lb 6.4 oz (167.1 kg)   BMI 61.30 kg/m      Assessment & Plan:  1. Viral gastroenteritis [employer] fluids Bland diet RTO prn Note given for jury duty - ondansetron (ZOFRAN) 4 MG tablet; Take 1 tablet (4 mg total) by mouth every 8 (eight) hours as needed for nausea or vomiting.  Dispense: 20 tablet; Refill: 0  2. Allergic rhinitis due to pollen, unspecified seasonality  3. Morbid obesity ([place])    Evelina Dun, FNP

## 2017-09-29 NOTE — Patient Instructions (Signed)

## 2017-12-01 ENCOUNTER — Ambulatory Visit: Payer: Self-pay | Admitting: Physician Assistant

## 2018-07-22 ENCOUNTER — Telehealth: Payer: Self-pay | Admitting: *Deleted

## 2018-07-22 ENCOUNTER — Other Ambulatory Visit: Payer: Self-pay | Admitting: Physician Assistant

## 2018-07-22 MED ORDER — FAMOTIDINE 40 MG PO TABS: 40.0000 mg | ORAL_TABLET | Freq: Every day | ORAL | 1 refills | Status: DC

## 2018-07-22 NOTE — Telephone Encounter (Signed)
Fax from [facility] requested Ranitidine 150 mg on national back order Original Rx 08/18/17 #30 with 11 RFs, Next OV not sched

## 2018-07-22 NOTE — Telephone Encounter (Signed)
A prescription of Pepcid has been sent to replace this and she can come in in the next month or 2 for her regular check.

## 2018-07-22 NOTE — Telephone Encounter (Signed)
Phone number in correct for patient.  Letter mailed with details of substitute medicine for ranitidine.

## 2018-08-07 ENCOUNTER — Encounter: Payer: Self-pay | Admitting: Family

## 2018-08-07 ENCOUNTER — Ambulatory Visit (INDEPENDENT_AMBULATORY_CARE_PROVIDER_SITE_OTHER): Payer: Self-pay | Admitting: Family

## 2018-08-07 VITALS — BP 148/91 | HR 92 | Temp 97.4°F | Ht 65.0 in | Wt 395.8 lb

## 2018-08-07 DIAGNOSIS — R350 Frequency of micturition: Secondary | ICD-10-CM

## 2018-08-07 DIAGNOSIS — N3001 Acute cystitis with hematuria: Secondary | ICD-10-CM

## 2018-08-07 LAB — URINALYSIS, COMPLETE
Bilirubin, UA: NEGATIVE
GLUCOSE, UA: NEGATIVE
Ketones, UA: NEGATIVE
Nitrite, UA: POSITIVE — AB
Specific Gravity, UA: >1.03 — ABNORMAL HIGH (ref 1.005–1.030)
UUROB: 0.2 mg/dL (ref 0.2–1.0)
pH, UA: 5.5 (ref 5.0–7.5)

## 2018-08-07 LAB — MICROSCOPIC EXAMINATION
RBC, UA: >30 /hpf — AB (ref 0–2)
Renal Epithel, UA: NONE SEEN /hpf

## 2018-08-07 MED ORDER — CEPHALEXIN 500 MG PO CAPS: 500.0000 mg | ORAL_CAPSULE | Freq: Two times a day (BID) | ORAL | 0 refills | Status: DC

## 2018-08-07 NOTE — Patient Instructions (Signed)

## 2018-08-07 NOTE — Progress Notes (Signed)
   Subjective:    Patient ID: Shelby [name], female    DOB: July 03, 1970, 48 y.o.   MRN: [id number]  Chief Complaint  Patient presents with  . urine freguency    burning    Urinary Frequency   This is a new problem. The current episode started in the past 7 days. The problem occurs every urination. The problem has been waxing and waning. The quality of the pain is described as burning. The pain is at a severity of 2/10. The pain is mild. Associated symptoms include frequency, hematuria and urgency. Pertinent negatives include no discharge, flank pain, hesitancy, nausea or vomiting. She has tried increased fluids for the symptoms. The treatment provided mild relief.      Review of Systems  Gastrointestinal: Negative for nausea and vomiting.  Genitourinary: Positive for frequency, hematuria and urgency. Negative for flank pain and hesitancy.  All other systems reviewed and are negative.      Objective:   Physical Exam Vitals signs reviewed.  Constitutional:      General: She is not in acute distress.    Appearance: She is well-developed.  HENT:     Head: Normocephalic and atraumatic.  Neck:     Musculoskeletal: Normal range of motion and neck supple.     Thyroid: No thyromegaly.  Cardiovascular:     Rate and Rhythm: Normal rate and regular rhythm.     Heart sounds: Normal heart sounds. No murmur.  Pulmonary:     Effort: Pulmonary effort is normal. No respiratory distress.     Breath sounds: Normal breath sounds. No wheezing.  Abdominal:     General: Bowel sounds are normal. There is no distension.     Palpations: Abdomen is soft.     Tenderness: There is no abdominal tenderness.  Musculoskeletal: Normal range of motion.        General: No tenderness.  Skin:    General: Skin is warm and dry.  Neurological:     Mental Status: She is alert and oriented to person, place, and time.     Cranial Nerves: No cranial nerve deficit.     Deep Tendon Reflexes: Reflexes are normal and  symmetric.  Psychiatric:        Behavior: Behavior normal.        Thought Content: Thought content normal.        Judgment: Judgment normal.     BP (!) 148/91   Pulse 92   Temp (!) 97.4 F (36.3 C) (Oral)   Ht 5\' 5"  (1.651 m)   Wt (!) 395 lb 12.8 oz (179.5 kg)   BMI 65.86 kg/m      Assessment & Plan:  Shelby [name] comes in today with chief complaint of urine freguency (burning)   Diagnosis and orders addressed:  1. Urine frequency - Urinalysis, Complete  2. Acute cystitis with hematuria Force fluids AZO over the counter X2 days RTO prn Culture pending - cephALEXin (KEFLEX) 500 MG capsule; Take 1 capsule (500 mg total) by mouth 2 (two) times daily.  Dispense: 14 capsule; Refill: 0 - Urine Culture  Evelina Dun, FNP

## 2018-08-11 ENCOUNTER — Telehealth: Payer: Self-pay | Admitting: Physician Assistant

## 2018-08-11 NOTE — Telephone Encounter (Signed)
She can take a couple of days of cystospaz OTC for the burning and drink a lot of water

## 2018-08-11 NOTE — Telephone Encounter (Signed)
Pharmacy [facility] [place]    Pt states that the medication Alyse Low gave her Friday has helped some with her UTI, but she is still having the burning when she goes to the bathroom, she does have couple doses left of the medication and wants to know what she should do since she is still having the burning.

## 2018-08-11 NOTE — Telephone Encounter (Signed)
Patient aware.

## 2018-08-13 ENCOUNTER — Other Ambulatory Visit: Payer: Self-pay

## 2018-08-13 ENCOUNTER — Telehealth: Payer: Self-pay | Admitting: Physician Assistant

## 2018-08-13 DIAGNOSIS — R399 Unspecified symptoms and signs involving the genitourinary system: Secondary | ICD-10-CM

## 2018-08-13 LAB — MICROSCOPIC EXAMINATION

## 2018-08-13 LAB — URINALYSIS, COMPLETE
Bilirubin, UA: NEGATIVE
Glucose, UA: NEGATIVE
Ketones, UA: NEGATIVE
Leukocytes, UA: NEGATIVE
Nitrite, UA: NEGATIVE
Specific Gravity, UA: 1.02 (ref 1.005–1.030)
Urobilinogen, Ur: 0.2 mg/dL (ref 0.2–1.0)
pH, UA: 7.5 (ref 5.0–7.5)

## 2018-08-13 NOTE — Telephone Encounter (Signed)
Patient aware and will bring in urine specimen

## 2018-08-13 NOTE — Telephone Encounter (Signed)
She needs to have a repeat Urinalysis

## 2018-08-14 LAB — URINE CULTURE

## 2018-08-31 ENCOUNTER — Telehealth: Payer: Self-pay | Admitting: Physician Assistant

## 2018-08-31 NOTE — Telephone Encounter (Signed)
appt made to see Particia Nearing, PA at 4:10 09/01/2018

## 2018-09-01 ENCOUNTER — Encounter: Payer: Self-pay | Admitting: Physician Assistant

## 2018-09-01 ENCOUNTER — Ambulatory Visit (INDEPENDENT_AMBULATORY_CARE_PROVIDER_SITE_OTHER): Payer: Self-pay | Admitting: Physician Assistant

## 2018-09-01 VITALS — BP 158/101 | HR 85 | Temp 98.5°F | Ht 65.0 in | Wt 395.0 lb

## 2018-09-01 DIAGNOSIS — I10 Essential (primary) hypertension: Secondary | ICD-10-CM

## 2018-09-01 DIAGNOSIS — R002 Palpitations: Secondary | ICD-10-CM

## 2018-09-01 DIAGNOSIS — J301 Allergic rhinitis due to pollen: Secondary | ICD-10-CM

## 2018-09-01 DIAGNOSIS — F321 Major depressive disorder, single episode, moderate: Secondary | ICD-10-CM

## 2018-09-01 DIAGNOSIS — R059 Cough, unspecified: Secondary | ICD-10-CM

## 2018-09-01 DIAGNOSIS — F419 Anxiety disorder, unspecified: Secondary | ICD-10-CM

## 2018-09-01 DIAGNOSIS — R05 Cough: Secondary | ICD-10-CM

## 2018-09-01 MED ORDER — CITALOPRAM HYDROBROMIDE 20 MG PO TABS: 20.0000 mg | ORAL_TABLET | Freq: Every day | ORAL | 5 refills | Status: DC

## 2018-09-01 MED ORDER — CETIRIZINE HCL 10 MG PO TABS: 10.0000 mg | ORAL_TABLET | Freq: Every day | ORAL | 11 refills | Status: DC

## 2018-09-01 MED ORDER — ALPRAZOLAM 0.5 MG PO TABS: 0.5000 mg | ORAL_TABLET | Freq: Every evening | ORAL | 0 refills | Status: DC | PRN

## 2018-09-01 MED ORDER — METOPROLOL TARTRATE 50 MG PO TABS: 50.0000 mg | ORAL_TABLET | Freq: Two times a day (BID) | ORAL | 6 refills | Status: DC

## 2018-09-01 MED ORDER — FLUTICASONE PROPIONATE 50 MCG/ACT NA SUSP: 2.0000 | Freq: Every day | NASAL | 11 refills | Status: DC

## 2018-09-01 MED ORDER — FAMOTIDINE 40 MG PO TABS: 40.0000 mg | ORAL_TABLET | Freq: Every day | ORAL | 11 refills | Status: DC

## 2018-09-01 MED ORDER — AZITHROMYCIN 250 MG PO TABS: ORAL_TABLET | ORAL | 0 refills | Status: DC

## 2018-09-03 NOTE — Progress Notes (Signed)
BP (!) 158/101   Pulse 85   Temp 98.5 F (36.9 C) (Oral)   Ht 5\' 5"  (1.651 m)   Wt (!) 395 lb (179.2 kg)   SpO2 98%   BMI 65.73 kg/m    Subjective:    Patient ID: Shelby [name], female    DOB: 02/18/1971, 48 y.o.   MRN: [id number]  HPI: Shelby [name] is a 48 y.o. female presenting on 09/01/2018 for Cough (x 2 weeks) and Wheezing  This patient has had many days of sinus headache and postnasal drainage. There is copious drainage at times. Denies any fever at this time. There has been a history of sinus infections in the past.  No history of sinus surgery. There is cough at night. It has become more prevalent in recent days.  She also needs refills on her medication for depression and hypertension.  There are no new issues.  Past Medical History:  Diagnosis Date  . Heart palpitations    Relevant past medical, surgical, family and social history reviewed and updated as indicated. Interim medical history since our last visit reviewed. Allergies and medications reviewed and updated. DATA REVIEWED: CHART IN EPIC  Family History reviewed for pertinent findings.  Review of Systems  Constitutional: Positive for chills and fatigue. Negative for activity change and appetite change.  HENT: Positive for congestion, postnasal drip and sore throat.   Eyes: Negative.   Respiratory: Positive for cough. Negative for wheezing.   Cardiovascular: Negative.  Negative for chest pain, palpitations and leg swelling.  Gastrointestinal: Negative.   Genitourinary: Negative.   Musculoskeletal: Negative.   Skin: Negative.   Neurological: Positive for headaches.    Allergies as of 09/01/2018      Reactions   Codeine Swelling      Medication List       Accurate as of September 01, 2018 11:59 PM. Always use your most recent med list.        ALPRAZolam 0.5 MG tablet Commonly known as:  XANAX Take 1 tablet (0.5 mg total) by mouth at bedtime as needed for anxiety.   azithromycin 250 MG tablet Commonly  known as:  Zithromax Z-Pak Take as directed   cetirizine 10 MG tablet Commonly known as:  ZYRTEC Take 1 tablet (10 mg total) by mouth daily.   citalopram 20 MG tablet Commonly known as:  CeleXA Take 1 tablet (20 mg total) by mouth daily. For depression   famotidine 40 MG tablet Commonly known as:  Pepcid Take 1 tablet (40 mg total) by mouth at bedtime.   fluticasone 50 MCG/ACT nasal spray Commonly known as:  FLONASE Place 2 sprays into both nostrils daily.   metoprolol tartrate 50 MG tablet Commonly known as:  LOPRESSOR Take 1 tablet (50 mg total) by mouth 2 (two) times daily.          Objective:    BP (!) 158/101   Pulse 85   Temp 98.5 F (36.9 C) (Oral)   Ht 5\' 5"  (1.651 m)   Wt (!) 395 lb (179.2 kg)   SpO2 98%   BMI 65.73 kg/m   Allergies  Allergen Reactions  . Codeine Swelling    Wt Readings from Last 3 Encounters:  09/01/18 (!) 395 lb (179.2 kg)  08/07/18 (!) 395 lb 12.8 oz (179.5 kg)  09/29/17 (!) 368 lb 6.4 oz (167.1 kg)    Physical Exam Constitutional:      Appearance: She is well-developed.  HENT:     Head:  Normocephalic and atraumatic.     Right Ear: Drainage and tenderness present.     Left Ear: Drainage and tenderness present.     Nose: Mucosal edema and rhinorrhea present.     Right Sinus: No maxillary sinus tenderness or frontal sinus tenderness.     Left Sinus: No maxillary sinus tenderness or frontal sinus tenderness.     Mouth/Throat:     Pharynx: Oropharyngeal exudate and posterior oropharyngeal erythema present.  Eyes:     Conjunctiva/sclera: Conjunctivae normal.     Pupils: Pupils are equal, round, and reactive to light.  Neck:     Musculoskeletal: Normal range of motion and neck supple.  Cardiovascular:     Rate and Rhythm: Normal rate and regular rhythm.     Heart sounds: Normal heart sounds.  Pulmonary:     Effort: Pulmonary effort is normal.     Breath sounds: Examination of the right-upper field reveals wheezing.  Examination of the left-upper field reveals wheezing. Wheezing present.  Abdominal:     General: Bowel sounds are normal.     Palpations: Abdomen is soft.  Skin:    General: Skin is warm and dry.     Findings: No rash.  Neurological:     Mental Status: She is alert and oriented to person, place, and time.     Deep Tendon Reflexes: Reflexes are normal and symmetric.  Psychiatric:        Behavior: Behavior normal.        Thought Content: Thought content normal.        Judgment: Judgment normal.         Assessment & Plan:   1. Palpitations - metoprolol tartrate (LOPRESSOR) 50 MG tablet; Take 1 tablet (50 mg total) by mouth 2 (two) times daily.  Dispense: 60 tablet; Refill: 6  2. Moderate single current episode of major depressive disorder (HCC) - citalopram (CELEXA) 20 MG tablet; Take 1 tablet (20 mg total) by mouth daily. For depression  Dispense: 30 tablet; Refill: 5  3. Seasonal allergic rhinitis due to pollen - fluticasone (FLONASE) 50 MCG/ACT nasal spray; Place 2 sprays into both nostrils daily.  Dispense: 16 g; Refill: 11 - cetirizine (ZYRTEC) 10 MG tablet; Take 1 tablet (10 mg total) by mouth daily.  Dispense: 30 tablet; Refill: 11  4. Anxiety - ALPRAZolam (XANAX) 0.5 MG tablet; Take 1 tablet (0.5 mg total) by mouth at bedtime as needed for anxiety.  Dispense: 40 tablet; Refill: 0  5. Cough - azithromycin (ZITHROMAX Z-PAK) 250 MG tablet; Take as directed  Dispense: 6 each; Refill: 0  6. Essential hypertension - metoprolol tartrate (LOPRESSOR) 50 MG tablet; Take 1 tablet (50 mg total) by mouth 2 (two) times daily.  Dispense: 60 tablet; Refill: 6  7. Morbid obesity ([place])   Continue all other maintenance medications as listed above.  Follow up plan: Return in about 4 weeks (around 09/29/2018) for recheck.  Educational handout given for [place] PA-C [place] [address]  [place], [place]  [address] [phone]   09/03/2018, 9:43 PM

## 2018-09-24 ENCOUNTER — Encounter: Payer: Self-pay | Admitting: Physician Assistant

## 2018-09-29 ENCOUNTER — Telehealth: Payer: Self-pay | Admitting: Physician Assistant

## 2018-09-29 ENCOUNTER — Telehealth (INDEPENDENT_AMBULATORY_CARE_PROVIDER_SITE_OTHER): Payer: Self-pay | Admitting: Physician Assistant

## 2018-09-29 ENCOUNTER — Other Ambulatory Visit: Payer: Self-pay

## 2018-09-29 DIAGNOSIS — R05 Cough: Secondary | ICD-10-CM

## 2018-09-29 DIAGNOSIS — R059 Cough, unspecified: Secondary | ICD-10-CM

## 2018-09-29 DIAGNOSIS — I10 Essential (primary) hypertension: Secondary | ICD-10-CM

## 2018-09-29 DIAGNOSIS — J301 Allergic rhinitis due to pollen: Secondary | ICD-10-CM

## 2018-09-29 DIAGNOSIS — F321 Major depressive disorder, single episode, moderate: Secondary | ICD-10-CM

## 2018-09-29 DIAGNOSIS — J209 Acute bronchitis, unspecified: Secondary | ICD-10-CM

## 2018-09-29 MED ORDER — AZITHROMYCIN 250 MG PO TABS: ORAL_TABLET | ORAL | 1 refills | Status: DC

## 2018-09-29 MED ORDER — PREDNISONE 10 MG (21) PO TBPK: ORAL_TABLET | ORAL | 0 refills | Status: DC

## 2018-09-29 NOTE — Telephone Encounter (Signed)
bronchitis

## 2018-09-29 NOTE — Telephone Encounter (Signed)
J20.9 is the code

## 2018-09-29 NOTE — Progress Notes (Signed)
Telephone visit  Subjective: Shelby [name], allergies, cough, hypertension PCP: Terald Sleeper, PA-C Shelby [name] is a 48 y.o. female calls for telephone consult today. Patient provides verbal consent for consult held via phone.  Patient is identified with 2 separate identifiers.  At this time the entire area is on COVID-19 social distance seen and stay home orders.  Patient is of higher risk and therefore we are performing this by a virtual method.  Location of provider: home Location of patient: home  Others present for call: no  This visit is a 4-week follow-up on her chronic medical conditions.  They do include depression, allergies, cough, hypertension.  She did have an improved cough after she took Zithromax about 1 month ago.  She has had productive cough again in the past week.  She does have severe allergies this time a year.  And she does get bronchitis often.  She denies any fever or chills.  She denies any exposure to any other viruses or anyone else that has been sick.  She states she has stayed home consistently for the past 2 weeks.  She has had good blood pressure readings since being home.  She takes it  She reports that her depression and anxiety are greatly improved.  She feels much better as far as irritability.  She does not get upset when people say things to her.  She has been taking the Celexa consistently.  She has not had to use any Xanax at this time. Depression screen [facility] 2/9 09/29/2018 09/01/2018 09/29/2017 08/18/2017 04/19/2016  Decreased Interest 3 0 0 0 0  Down, Depressed, Hopeless 0 0 0 0 3  PHQ - 2 Score 3 0 0 0 3  Altered sleeping 3 - - - 3  Tired, decreased energy 2 - - - 0  Change in appetite 1 - - - 0  Feeling bad or failure about yourself  0 - - - 1  Trouble concentrating 0 - - - 3  Moving slowly or fidgety/restless 0 - - - 0  Suicidal thoughts 0 - - - 0  PHQ-9 Score 9 - - - 10      ROS: Per HPI  Allergies  Allergen Reactions  . Codeine  Swelling   Past Medical History:  Diagnosis Date  . Heart palpitations     Current Outpatient Medications:  .  ALPRAZolam (XANAX) 0.5 MG tablet, Take 1 tablet (0.5 mg total) by mouth at bedtime as needed for anxiety., Disp: 40 tablet, Rfl: 0 .  azithromycin (ZITHROMAX Z-PAK) 250 MG tablet, Take as directed, Disp: 6 each, Rfl: 1 .  cetirizine (ZYRTEC) 10 MG tablet, Take 1 tablet (10 mg total) by mouth daily., Disp: 30 tablet, Rfl: 11 .  citalopram (CELEXA) 20 MG tablet, Take 1 tablet (20 mg total) by mouth daily. For depression, Disp: 30 tablet, Rfl: 5 .  famotidine (PEPCID) 40 MG tablet, Take 1 tablet (40 mg total) by mouth at bedtime., Disp: 30 tablet, Rfl: 11 .  fluticasone (FLONASE) 50 MCG/ACT nasal spray, Place 2 sprays into both nostrils daily., Disp: 16 g, Rfl: 11 .  metoprolol tartrate (LOPRESSOR) 50 MG tablet, Take 1 tablet (50 mg total) by mouth 2 (two) times daily., Disp: 60 tablet, Rfl: 6 .  predniSONE (STERAPRED UNI-PAK 21 TAB) 10 MG (21) TBPK tablet, As directed x 6 days, Disp: 21 tablet, Rfl: 0  Assessment/ Plan: 48 y.o. female   1. Moderate single current episode of major depressive disorder (HCC) Continue Celexa  20 mg 1 daily  2. Seasonal allergic rhinitis due to pollen - predniSONE (STERAPRED UNI-PAK 21 TAB) 10 MG (21) TBPK tablet; As directed x 6 days  Dispense: 21 tablet; Refill: 0  3. Cough - predniSONE (STERAPRED UNI-PAK 21 TAB) 10 MG (21) TBPK tablet; As directed x 6 days  Dispense: 21 tablet; Refill: 0 - azithromycin (ZITHROMAX Z-PAK) 250 MG tablet; Take as directed  Dispense: 6 each; Refill: 1  4. Essential hypertension Continue metoprolol   Start time: 10:39 AM End time: 10:51 AM  Meds ordered this encounter  Medications  . predniSONE (STERAPRED UNI-PAK 21 TAB) 10 MG (21) TBPK tablet    Sig: As directed x 6 days    Dispense:  21 tablet    Refill:  0    Order Specific Question:   Supervising Provider    Answer:   Janora Norlander [[id number]]  .  azithromycin (ZITHROMAX Z-PAK) 250 MG tablet    Sig: Take as directed    Dispense:  6 each    Refill:  1    Order Specific Question:   Supervising Provider    Answer:   Janora Norlander [[id number]]    Shelby Nearing PA-C [place] [phone]

## 2018-09-29 NOTE — Telephone Encounter (Signed)
Pharmacy aware of code.

## 2018-09-29 NOTE — Progress Notes (Signed)
The pharmacy has called with the request for her diagnosis code for the Zithromax.  I am assuming this is because of COVID-19 restrictions.  The patient does have bronchitis.  1. Moderate single current episode of major depressive disorder ([place])  2. Seasonal allergic rhinitis due to pollen - predniSONE (STERAPRED UNI-PAK 21 TAB) 10 MG (21) TBPK tablet; As directed x 6 days  Dispense: 21 tablet; Refill: 0  3. Cough - predniSONE (STERAPRED UNI-PAK 21 TAB) 10 MG (21) TBPK tablet; As directed x 6 days  Dispense: 21 tablet; Refill: 0 - azithromycin (ZITHROMAX Z-PAK) 250 MG tablet; Take as directed  Dispense: 6 each; Refill: 1  4. Essential hypertension  5. Acute bronchitis, unspecified organism - predniSONE (STERAPRED UNI-PAK 21 TAB) 10 MG (21) TBPK tablet; As directed x 6 days  Dispense: 21 tablet; Refill: 0 - azithromycin (ZITHROMAX Z-PAK) 250 MG tablet; Take as directed  Dispense: 6 each; Refill: 1

## 2018-09-30 ENCOUNTER — Telehealth: Payer: Self-pay | Admitting: Physician Assistant

## 2018-09-30 NOTE — Telephone Encounter (Signed)
Wrong information

## 2018-11-03 ENCOUNTER — Other Ambulatory Visit: Payer: Self-pay

## 2018-11-03 ENCOUNTER — Encounter: Payer: Self-pay | Admitting: Physician Assistant

## 2018-11-03 ENCOUNTER — Ambulatory Visit (INDEPENDENT_AMBULATORY_CARE_PROVIDER_SITE_OTHER): Payer: Self-pay | Admitting: Physician Assistant

## 2018-11-03 DIAGNOSIS — J301 Allergic rhinitis due to pollen: Secondary | ICD-10-CM

## 2018-11-03 DIAGNOSIS — J0110 Acute frontal sinusitis, unspecified: Secondary | ICD-10-CM

## 2018-11-03 MED ORDER — AMOXICILLIN-POT CLAVULANATE 875-125 MG PO TABS: 1.0000 | ORAL_TABLET | Freq: Two times a day (BID) | ORAL | 0 refills | Status: DC

## 2018-11-03 MED ORDER — LORATADINE 10 MG PO TABS: 10.0000 mg | ORAL_TABLET | Freq: Every day | ORAL | 11 refills | Status: DC

## 2018-11-03 MED ORDER — FLUCONAZOLE 150 MG PO TABS: ORAL_TABLET | ORAL | 0 refills | Status: DC

## 2018-11-03 NOTE — Progress Notes (Signed)
Telephone visit  Subjective: CC:**allergies* PCP: Terald Sleeper, PA-C XLK:[id number] Shelby [name] is a 48 y.o. female calls for telephone consult today. Patient provides verbal consent for consult held via phone.  Patient is identified with 2 separate identifiers.  At this time the entire area is on COVID-19 social distancing and stay home orders are in place.  Patient is of higher risk and therefore we are performing this by a virtual method.  Location of patient: home  Location of provider: [facility] Others present for call: no  Patient has a known allergic rhinitis in the spring.  She has been using her Zyrtec and Flonase regularly but just continues with chronic wateriness of her nose.  She has begun to have severe frontal pain and a lot of postnasal drainage. This patient has had many days of sinus headache and postnasal drainage. There is copious drainage at times. Denies any fever at this time. There has been a history of sinus infections in the past.  No history of sinus surgery. There is cough at night. It has become more prevalent in recent days.    ROS: Per HPI  Allergies  Allergen Reactions  . Codeine Swelling   Past Medical History:  Diagnosis Date  . Heart palpitations     Current Outpatient Medications:  .  ALPRAZolam (XANAX) 0.5 MG tablet, Take 1 tablet (0.5 mg total) by mouth at bedtime as needed for anxiety., Disp: 40 tablet, Rfl: 0 .  amoxicillin-clavulanate (AUGMENTIN) 875-125 MG tablet, Take 1 tablet by mouth 2 (two) times daily., Disp: 20 tablet, Rfl: 0 .  cetirizine (ZYRTEC) 10 MG tablet, Take 1 tablet (10 mg total) by mouth daily., Disp: 30 tablet, Rfl: 11 .  citalopram (CELEXA) 20 MG tablet, Take 1 tablet (20 mg total) by mouth daily. For depression, Disp: 30 tablet, Rfl: 5 .  famotidine (PEPCID) 40 MG tablet, Take 1 tablet (40 mg total) by mouth at bedtime., Disp: 30 tablet, Rfl: 11 .  fluconazole (DIFLUCAN) 150 MG tablet, 1 po q week x 4 weeks, Disp: 4 tablet,  Rfl: 0 .  fluticasone (FLONASE) 50 MCG/ACT nasal spray, Place 2 sprays into both nostrils daily., Disp: 16 g, Rfl: 11 .  loratadine (CLARITIN) 10 MG tablet, Take 1 tablet (10 mg total) by mouth daily., Disp: 30 tablet, Rfl: 11 .  metoprolol tartrate (LOPRESSOR) 50 MG tablet, Take 1 tablet (50 mg total) by mouth 2 (two) times daily., Disp: 60 tablet, Rfl: 6 .  predniSONE (STERAPRED UNI-PAK 21 TAB) 10 MG (21) TBPK tablet, As directed x 6 days, Disp: 21 tablet, Rfl: 0  Assessment/ Plan: 48 y.o. female   1. Seasonal allergic rhinitis due to pollen - loratadine (CLARITIN) 10 MG tablet; Take 1 tablet (10 mg total) by mouth daily.  Dispense: 30 tablet; Refill: 11  2. Acute non-recurrent frontal sinusitis - amoxicillin-clavulanate (AUGMENTIN) 875-125 MG tablet; Take 1 tablet by mouth 2 (two) times daily.  Dispense: 20 tablet; Refill: 0 - fluconazole (DIFLUCAN) 150 MG tablet; 1 po q week x 4 weeks  Dispense: 4 tablet; Refill: 0   Start time: 9:54 AM End time: 10:02 AM  Meds ordered this encounter  Medications  . amoxicillin-clavulanate (AUGMENTIN) 875-125 MG tablet    Sig: Take 1 tablet by mouth 2 (two) times daily.    Dispense:  20 tablet    Refill:  0    Order Specific Question:   Supervising Provider    Answer:   Janora Norlander [[id number]]  .  loratadine (CLARITIN) 10 MG tablet    Sig: Take 1 tablet (10 mg total) by mouth daily.    Dispense:  30 tablet    Refill:  11    Order Specific Question:   Supervising Provider    Answer:   Janora Norlander [[id number]]  . fluconazole (DIFLUCAN) 150 MG tablet    Sig: 1 po q week x 4 weeks    Dispense:  4 tablet    Refill:  0    Order Specific Question:   Supervising Provider    Answer:   Janora Norlander [[id number]]    Particia Nearing PA-C [place] [phone]

## 2019-01-08 ENCOUNTER — Other Ambulatory Visit: Payer: Self-pay | Admitting: Physician Assistant

## 2019-01-08 ENCOUNTER — Telehealth: Payer: Self-pay | Admitting: Physician Assistant

## 2019-01-08 ENCOUNTER — Other Ambulatory Visit: Payer: Self-pay | Admitting: *Deleted

## 2019-01-08 MED ORDER — PANTOPRAZOLE SODIUM 40 MG PO TBEC: 40.0000 mg | DELAYED_RELEASE_TABLET | Freq: Every day | ORAL | 5 refills | Status: DC

## 2019-01-08 NOTE — Telephone Encounter (Signed)
Pharmacy aware

## 2019-01-08 NOTE — Telephone Encounter (Signed)
Sent protonix

## 2019-01-08 NOTE — Progress Notes (Signed)
I sent protonix today because she asked for it and discontinued the famotidine. Not sure what I need to do.

## 2019-01-29 ENCOUNTER — Ambulatory Visit: Payer: Self-pay | Admitting: Family Medicine

## 2019-01-29 ENCOUNTER — Ambulatory Visit (INDEPENDENT_AMBULATORY_CARE_PROVIDER_SITE_OTHER): Payer: Self-pay | Admitting: Family Medicine

## 2019-01-29 ENCOUNTER — Encounter: Payer: Self-pay | Admitting: Family Medicine

## 2019-01-29 DIAGNOSIS — S300XXA Contusion of lower back and pelvis, initial encounter: Secondary | ICD-10-CM

## 2019-01-29 MED ORDER — CYCLOBENZAPRINE HCL 10 MG PO TABS: 10.0000 mg | ORAL_TABLET | Freq: Three times a day (TID) | ORAL | 0 refills | Status: DC | PRN

## 2019-01-29 MED ORDER — PREDNISONE 20 MG PO TABS: ORAL_TABLET | ORAL | 0 refills | Status: DC

## 2019-01-29 NOTE — Progress Notes (Signed)
Virtual Visit via telephone Note  I connected with Shelby [name] on 01/29/19 at 0926 by telephone and verified that I am speaking with the correct person using two identifiers. Shelby [name] is currently located at home and no other people are currently with her during visit. The provider, Fransisca Kaufmann Dettinger, MD is located in their office at time of visit.  Call ended at [phone]  I discussed the limitations, risks, security and privacy concerns of performing an evaluation and management service by telephone and the availability of in person appointments. I also discussed with the patient that there may be a patient responsible charge related to this service. The patient expressed understanding and agreed to proceed.   History and Present Illness: Patient is calling in with complaints of low back pain.  She fell 2 weeks ago.  She has range of motion and prolonged sitting bothering her.  She says her back is not getting better.  She sits a lot at work and that keeps it flared up.  Some days are better than others.  Trying ibuprofen and advil and heat and they help some.  She denies any shooting pain or numbness or weakness.  It is the lower middle of her back.   No diagnosis found.  Outpatient Encounter Medications as of 01/29/2019  Medication Sig  . ALPRAZolam (XANAX) 0.5 MG tablet Take 1 tablet (0.5 mg total) by mouth at bedtime as needed for anxiety.  . cetirizine (ZYRTEC) 10 MG tablet Take 1 tablet (10 mg total) by mouth daily.  . citalopram (CELEXA) 20 MG tablet Take 1 tablet (20 mg total) by mouth daily. For depression  . fluconazole (DIFLUCAN) 150 MG tablet 1 po q week x 4 weeks  . fluticasone (FLONASE) 50 MCG/ACT nasal spray Place 2 sprays into both nostrils daily.  Marland Kitchen loratadine (CLARITIN) 10 MG tablet Take 1 tablet (10 mg total) by mouth daily.  . metoprolol tartrate (LOPRESSOR) 50 MG tablet Take 1 tablet (50 mg total) by mouth 2 (two) times daily.  . pantoprazole (PROTONIX) 40 MG tablet  Take 1 tablet (40 mg total) by mouth daily.  . predniSONE (STERAPRED UNI-PAK 21 TAB) 10 MG (21) TBPK tablet As directed x 6 days   No facility-administered encounter medications on file as of 01/29/2019.     Review of Systems  Constitutional: Negative for chills and fever.  Eyes: Negative for visual disturbance.  Respiratory: Negative for chest tightness and shortness of breath.   Cardiovascular: Negative for chest pain and leg swelling.  Musculoskeletal: Positive for arthralgias and back pain. Negative for gait problem.  Skin: Negative for rash.  Neurological: Negative for light-headedness and headaches.  Psychiatric/Behavioral: Negative for agitation and behavioral problems.  All other systems reviewed and are negative.   Observations/Objective: Patient sounds comfortable and in no acute distress  Assessment and Plan: Problem List Items Addressed This Visit    None    Visit Diagnoses    Lumbar contusion, initial encounter    -  Primary   Relevant Medications   predniSONE (DELTASONE) 20 MG tablet   cyclobenzaprine (FLEXERIL) 10 MG tablet       Follow Up Instructions: Follow-up as needed, if not improved in a couple weeks may consider physical therapy    I discussed the assessment and treatment plan with the patient. The patient was provided an opportunity to ask questions and all were answered. The patient agreed with the plan and demonstrated an understanding of the instructions.   The  patient was advised to call back or seek an in-person evaluation if the symptoms worsen or if the condition fails to improve as anticipated.  The above assessment and management plan was discussed with the patient. The patient verbalized understanding of and has agreed to the management plan. Patient is aware to call the clinic if symptoms persist or worsen. Patient is aware when to return to the clinic for a follow-up visit. Patient educated on when it is appropriate to go to the emergency  department.    I provided 9 minutes of non-face-to-face time during this encounter.    Worthy Rancher, MD

## 2019-02-11 ENCOUNTER — Other Ambulatory Visit: Payer: Self-pay | Admitting: Physician Assistant

## 2019-02-11 DIAGNOSIS — F321 Major depressive disorder, single episode, moderate: Secondary | ICD-10-CM

## 2019-03-22 ENCOUNTER — Other Ambulatory Visit: Payer: Self-pay | Admitting: Physician Assistant

## 2019-03-22 DIAGNOSIS — F321 Major depressive disorder, single episode, moderate: Secondary | ICD-10-CM

## 2019-03-23 MED ORDER — CITALOPRAM HYDROBROMIDE 20 MG PO TABS: 20.0000 mg | ORAL_TABLET | Freq: Every day | ORAL | 0 refills | Status: DC

## 2019-03-23 NOTE — Addendum Note (Signed)
Addended by: Antonietta Barcelona D on: 03/23/2019 08:18 AM   Modules accepted: Orders

## 2019-05-05 ENCOUNTER — Ambulatory Visit (INDEPENDENT_AMBULATORY_CARE_PROVIDER_SITE_OTHER): Payer: Self-pay | Admitting: Physician Assistant

## 2019-05-05 DIAGNOSIS — I10 Essential (primary) hypertension: Secondary | ICD-10-CM

## 2019-05-05 DIAGNOSIS — R002 Palpitations: Secondary | ICD-10-CM

## 2019-05-05 DIAGNOSIS — F321 Major depressive disorder, single episode, moderate: Secondary | ICD-10-CM

## 2019-05-05 MED ORDER — CITALOPRAM HYDROBROMIDE 20 MG PO TABS: 20.0000 mg | ORAL_TABLET | Freq: Every day | ORAL | 11 refills | Status: DC

## 2019-05-05 MED ORDER — METOPROLOL TARTRATE 50 MG PO TABS: 50.0000 mg | ORAL_TABLET | Freq: Two times a day (BID) | ORAL | 6 refills | Status: DC

## 2019-05-09 ENCOUNTER — Encounter: Payer: Self-pay | Admitting: Physician Assistant

## 2019-05-09 NOTE — Progress Notes (Signed)
BP 122/78   Pulse 72    Telephone visit  Subjective: CC: Recheck on chronic medical conditions PCP: Terald Sleeper, PA-C [id number] Shelby [name] is a 48 y.o. female calls for telephone consult today. Patient provides verbal consent for consult held via phone.  Patient is identified with 2 separate identifiers.  At this time the entire area is on COVID-19 social distancing and stay home orders are in place.  Patient is of higher risk and therefore we are performing this by a virtual method.  Location of patient: home Location of provider: HOME Others present for call: no  1.  This patient is having follow-up on her chronic medical conditions that do include hypertension, palpitations, depression.  She states that overall she is doing very well has had good blood pressure readings as noted and good pulses.  She has not had any difficulty with chest pain.  She also states that overall her worry and depression are fairly stable and she is not having any difficulty with this.  She has not had any difficulty with her medication and she does need refills.  These will be placed.  She also states that her GERD improved greatly.  Depression screen [facility] 2/9 05/09/2019 09/29/2018 09/01/2018 09/29/2017 08/18/2017  Decreased Interest 0 3 0 0 0  Down, Depressed, Hopeless 0 0 0 0 0  PHQ - 2 Score 0 3 0 0 0  Altered sleeping - 3 - - -  Tired, decreased energy - 2 - - -  Change in appetite - 1 - - -  Feeling bad or failure about yourself  - 0 - - -  Trouble concentrating - 0 - - -  Moving slowly or fidgety/restless - 0 - - -  Suicidal thoughts - 0 - - -  PHQ-9 Score - 9 - - -     ROS: Per HPI  Allergies  Allergen Reactions  . Codeine Swelling   Past Medical History:  Diagnosis Date  . Heart palpitations     Current Outpatient Medications:  .  ALPRAZolam (XANAX) 0.5 MG tablet, Take 1 tablet (0.5 mg total) by mouth at bedtime as needed for anxiety., Disp: 40 tablet, Rfl: 0 .  cetirizine (ZYRTEC) 10  MG tablet, Take 1 tablet (10 mg total) by mouth daily., Disp: 30 tablet, Rfl: 11 .  citalopram (CELEXA) 20 MG tablet, Take 1 tablet (20 mg total) by mouth daily. (Needs to be seen before next refill), Disp: 30 tablet, Rfl: 11 .  cyclobenzaprine (FLEXERIL) 10 MG tablet, Take 1 tablet (10 mg total) by mouth 3 (three) times daily as needed for muscle spasms., Disp: 30 tablet, Rfl: 0 .  fluticasone (FLONASE) 50 MCG/ACT nasal spray, Place 2 sprays into both nostrils daily., Disp: 16 g, Rfl: 11 .  loratadine (CLARITIN) 10 MG tablet, Take 1 tablet (10 mg total) by mouth daily., Disp: 30 tablet, Rfl: 11 .  metoprolol tartrate (LOPRESSOR) 50 MG tablet, Take 1 tablet (50 mg total) by mouth 2 (two) times daily., Disp: 60 tablet, Rfl: 6 .  pantoprazole (PROTONIX) 40 MG tablet, Take 1 tablet (40 mg total) by mouth daily., Disp: 30 tablet, Rfl: 5 .  predniSONE (DELTASONE) 20 MG tablet, Take 3 tabs daily for 1 week, then 2 tabs daily for week 2, then 1 tab daily for week 3., Disp: 42 tablet, Rfl: 0  Assessment/ Plan: 48 y.o. female   1. Moderate single current episode of major depressive disorder (HCC) - citalopram (CELEXA) 20 MG tablet;  Take 1 tablet (20 mg total) by mouth daily. (Needs to be seen before next refill)  Dispense: 30 tablet; Refill: 11  2. Palpitations - metoprolol tartrate (LOPRESSOR) 50 MG tablet; Take 1 tablet (50 mg total) by mouth 2 (two) times daily.  Dispense: 60 tablet; Refill: 6  3. Essential hypertension - metoprolol tartrate (LOPRESSOR) 50 MG tablet; Take 1 tablet (50 mg total) by mouth 2 (two) times daily.  Dispense: 60 tablet; Refill: 6   No follow-ups on file.  Continue all other maintenance medications as listed above.  Start time: 10:42 AM End time: 10:52 AM  Meds ordered this encounter  Medications  . citalopram (CELEXA) 20 MG tablet    Sig: Take 1 tablet (20 mg total) by mouth daily. (Needs to be seen before next refill)    Dispense:  30 tablet    Refill:  11     Order Specific Question:   Supervising Provider    Answer:   Janora Norlander [id number]  . metoprolol tartrate (LOPRESSOR) 50 MG tablet    Sig: Take 1 tablet (50 mg total) by mouth 2 (two) times daily.    Dispense:  60 tablet    Refill:  6    Order Specific Question:   Supervising Provider    Answer:   Janora Norlander [id number]    Particia Nearing PA-C [place] [phone]

## 2019-07-01 ENCOUNTER — Other Ambulatory Visit: Payer: Self-pay | Admitting: Physician Assistant

## 2019-09-20 ENCOUNTER — Other Ambulatory Visit: Payer: Self-pay | Admitting: Physician Assistant

## 2019-09-20 DIAGNOSIS — J301 Allergic rhinitis due to pollen: Secondary | ICD-10-CM

## 2019-11-19 ENCOUNTER — Other Ambulatory Visit: Payer: Self-pay | Admitting: Family Medicine

## 2019-11-19 DIAGNOSIS — J301 Allergic rhinitis due to pollen: Secondary | ICD-10-CM

## 2020-01-13 ENCOUNTER — Other Ambulatory Visit: Payer: Self-pay | Admitting: Nurse Practitioner

## 2020-01-13 DIAGNOSIS — J301 Allergic rhinitis due to pollen: Secondary | ICD-10-CM

## 2020-03-21 ENCOUNTER — Other Ambulatory Visit: Payer: Self-pay | Admitting: Family Medicine

## 2020-06-29 ENCOUNTER — Other Ambulatory Visit (HOSPITAL_COMMUNITY): Payer: Self-pay | Admitting: Family

## 2020-06-29 DIAGNOSIS — Z1231 Encounter for screening mammogram for malignant neoplasm of breast: Secondary | ICD-10-CM

## 2020-07-03 ENCOUNTER — Ambulatory Visit (HOSPITAL_COMMUNITY)
Admission: RE | Admit: 2020-07-03 | Discharge: 2020-07-03 | Disposition: A | Discharge status: Home / Self Care | Payer: Self-pay | Source: Ambulatory Visit | Attending: Family | Admitting: Family

## 2020-07-03 ENCOUNTER — Other Ambulatory Visit: Payer: Self-pay

## 2020-07-03 ENCOUNTER — Other Ambulatory Visit (HOSPITAL_COMMUNITY): Payer: Self-pay | Admitting: Family

## 2020-07-03 ENCOUNTER — Telehealth: Payer: Self-pay

## 2020-07-03 DIAGNOSIS — R928 Other abnormal and inconclusive findings on diagnostic imaging of breast: Secondary | ICD-10-CM

## 2020-07-03 DIAGNOSIS — Z1231 Encounter for screening mammogram for malignant neoplasm of breast: Secondary | ICD-10-CM

## 2020-07-03 NOTE — Telephone Encounter (Signed)
Pt called requesting that a provider call her with her mammogram results ASAP. Says she had the mammogram test done at [facility] today and she is now scheduled to have an US done tomorrow and doesn't know what is going on because she was told that they can't give her the results of her mammogram. Pt is very upset and worried.  Please call [place] at [phone]

## 2020-07-04 ENCOUNTER — Ambulatory Visit (HOSPITAL_COMMUNITY)
Admission: RE | Admit: 2020-07-04 | Discharge: 2020-07-04 | Disposition: A | Discharge status: Home / Self Care | Payer: Self-pay | Source: Ambulatory Visit | Attending: Family | Admitting: Family

## 2020-07-04 ENCOUNTER — Other Ambulatory Visit: Payer: Self-pay

## 2020-07-04 ENCOUNTER — Encounter (HOSPITAL_COMMUNITY): Payer: Self-pay

## 2020-07-04 ENCOUNTER — Other Ambulatory Visit (HOSPITAL_COMMUNITY): Payer: Self-pay | Admitting: Family

## 2020-07-04 DIAGNOSIS — R928 Other abnormal and inconclusive findings on diagnostic imaging of breast: Secondary | ICD-10-CM | POA: Insufficient documentation

## 2020-07-04 MED ORDER — LIDOCAINE-EPINEPHRINE (PF) 1 %-1:200000 IJ SOLN
INTRAMUSCULAR | Status: AC
  Administered 2020-07-04: 30 mL
  Filled 2020-07-04: qty 30

## 2020-07-04 MED ORDER — SODIUM BICARBONATE 4.2 % IV SOLN
INTRAVENOUS | Status: AC
  Administered 2020-07-04: 2.5 meq
  Filled 2020-07-04: qty 10

## 2020-07-04 MED ORDER — LIDOCAINE HCL (PF) 2 % IJ SOLN
INTRAMUSCULAR | Status: AC
  Administered 2020-07-04: 20 mL
  Filled 2020-07-04: qty 20

## 2020-07-04 NOTE — Discharge Instructions (Signed)
Breast Biopsy, Care After These instructions give you information about caring for yourself after your procedure. Your doctor may also give you more specific instructions. Call your doctor if you have any problems or questions after your procedure. What can I expect after the procedure? After your procedure, it is common to have:  Bruising on your breast.  Numbness, tingling, or pain near your biopsy site. Follow these instructions at home: Medicines  Take over-the-counter and prescription medicines only as told by your doctor.  Do not drive for 24 hours if you were given a medicine to help you relax (sedative) during your procedure.  Do not drink alcohol while taking pain medicine.  Do not drive or use heavy machinery while taking prescription pain medicine. Biopsy site care      Follow instructions from your doctor about how to take care of your cut from surgery (incision) or your puncture area. Make sure you: ? Wash your hands with soap and water before you change your bandage (dressing). If you cannot use soap and water, use hand sanitizer. ? Change your bandage as told by your doctor. ? Leave stitches (sutures), skin glue, or skin tape (adhesive strips) in place. They may need to stay in place for 2 weeks or longer. If tape strips get loose and curl up, you may trim the loose edges. Do not remove tape strips completely unless your doctor says it is okay.  If you have stitches, keep them dry when you take a bath or a shower.  Check your cut or puncture area every day for signs of infection. Check for: ? Redness, swelling, or pain. ? Fluid or blood. ? Warmth. ? Pus or a bad smell.  Protect the biopsy area. Do not let the area get bumped. Activity  If you had a cut during your procedure, avoid activities that could pull your cut open. These include: ? Stretching. ? Reaching over your head. ? Exercise. ? Sports. ? Lifting anything that weighs more than 3 lb (1.4  kg).  Return to your normal activities as told by your doctor. Ask your doctor what activities are safe for you. Managing pain, stiffness, and swelling If told, put ice on the biopsy site to relieve swelling:  Put ice in a plastic bag.  Place a towel between your skin and the bag.  Leave the ice on for 20 minutes, 2-3 times a day. General instructions  Continue your normal diet.  Wear a good support bra for as long as told by your doctor.  Get checked for extra fluid around your lymph nodes (lymphedema) as often as told by your doctor.  Keep all follow-up visits as told by your doctor. This is important. Contact a doctor if:  You notice any of the following at the biopsy site: ? More redness, swelling, or pain. ? More fluid or blood coming from the site. ? The site feels warm to the touch. ? Pus or a bad smell coming from the site. ? The site breaks open after the stitches or skin tape strips have been removed.  You have a rash.  You have a fever. Get help right away if:  You have more bleeding from the biopsy site. Get help right away if bleeding is more than a small spot.  You have trouble breathing.  You have red streaks around the biopsy site. Summary  After your procedure, it is common to have bruising, numbness, tingling, or pain near the biopsy site.  Do not drive   or use heavy machinery while taking prescription pain medicine.  Wear a good support bra for as long as told by your doctor.  If you had a cut during your procedure, avoid activities that may pull the cut open. Ask your doctor what activities are safe for you. This information is not intended to replace advice given to you by your health care provider. Make sure you discuss any questions you have with your health care provider. Document Revised: 12/04/2017 Document Reviewed: 12/04/2017 Elsevier Patient Education  2020 [employer]  

## 2020-07-04 NOTE — Telephone Encounter (Signed)
I spoke with pt and she is already aware of results and has biopsy scheduled for today.

## 2020-07-04 NOTE — Sedation Documentation (Signed)
PT tolerated right breast biopsy well today with NAD noted. PT verbalized understanding of discharge instructions. PT took wheelchair back to the mammogram area this time.

## 2020-07-05 ENCOUNTER — Telehealth: Payer: Self-pay

## 2020-07-05 NOTE — Telephone Encounter (Signed)
Please review patients mammogram results and call patient with results asap.  [phone]

## 2020-07-06 NOTE — Telephone Encounter (Signed)
This has already been completed. 

## 2020-07-07 ENCOUNTER — Ambulatory Visit: Payer: Self-pay | Admitting: Surgery

## 2020-07-07 DIAGNOSIS — C50911 Malignant neoplasm of unspecified site of right female breast: Secondary | ICD-10-CM

## 2020-07-07 NOTE — H&P (Signed)
Shelby [name] Appointment: 07/07/2020 9:20 AM Location: [facility] Patient #: [id number] DOB: 04-27-1971 Married / Language: [place] / Race: White Female  History of Present Illness Maisie Fus A. Curtis Cain MD; 07/07/2020 12:34 PM) Patient words: Patient presents for evaluation of a newly diagnosed right breast cancer detected on recent mammogram. The patient some bruising of her right breast which prompted mammographic imaging. She was found to have a 2.4 cm retroareolar mass. Core biopsy showed invasive mammary carcinoma consistent with a lobular subtype. She also had a positive right axillary lymph node was biopsied. The tumor was hormone receptor positive her 2 neu pending. Patient denies any mass, discharge or pain prior to her mammogram. He bruising change noted was about 3 months ago.     CLINICAL DATA: Screening recall for right breast mass and abnormal right axillary lymph nodes.  EXAM: DIGITAL DIAGNOSTIC UNILATERAL RIGHT MAMMOGRAM WITH TOMO AND CAD; ULTRASOUND RIGHT BREAST LIMITED  COMPARISON: Previous exams.  ACR Breast Density Category b: There are scattered areas of fibroglandular density.  FINDINGS: Spot compression tomograms were performed of the right breast. There is an irregular mass in the retroareolar right breast with associated nipple retraction, with the mass measuring approximately 2.4 cm. The abnormal lymph nodes seen on the initial screening mammogram could not be included in the field of view on the spot compression MLO tomograms.  Mammographic images were processed with CAD.  Physical examination reveals mild retraction/flattening of the right nipple with associated palpable retroareolar thickening.  Targeted ultrasound of the right breast was performed. There is an irregular somewhat ill-defined mixed echogenicity predominantly hypoechoic mass in the retroareolar right breast measuring 2.4 x 1.7 x 1.3 cm.  There are 2  morphologically abnormal lymph nodes in the right axilla with the more superficial lymph node measuring up to 1.9 cm, with a cortex thickness of 1.1 cm.  IMPRESSION: 1. Suspicious 2.4 cm mass in the retroareolar right breast.  2. Suspicious morphologically abnormal lymph nodes in the right axilla.  RECOMMENDATION: 1. Recommend ultrasound-guided biopsy of the mass in the retroareolar right breast.  2. Recommend ultrasound guided biopsy of 1 of the abnormal lymph nodes in the right axilla.  I have discussed the findings and recommendations with the patient. If applicable, a reminder letter will be sent to the patient regarding the next appointment.  BI-RADS CATEGORY 4: Suspicious.   Electronically Signed By: Edwin Cap M.D. On: 07/04/2020 09:52   SURGICAL PATHOLOGY CASE: [id number] PATIENT: Elianah Upshur Surgical Pathology Report   Reason for Addendum #1: Immunohistochemistry results Reason for Addendum #2: Breast Biomarker Results  Clinical History: mass  ADDENDUM:  E-cadherin is negative consistent with a lobular phenotype.   FINAL MICROSCOPIC DIAGNOSIS:  A. BREAST, RETROAREOLAR, RIGHT, BIOPSY: - Invasive mammary carcinoma, see comment.  B. LYMPH NODE, AXILLA, RIGHT, BIOPSY: - Metastatic carcinoma in a lymph node.  COMMENT:  A. The carcinoma appears grade 1-2 and measures 9 mm in greatest linear extent. E-cadherin will be ordered. Prognostic makers will be ordered. Dr. Luisa Hart has reviewed the case. Randa Lynn was attempted on 07/05/2020.    Estrogen Receptor: POSITIVE, 95% MODERATE STAINING INTENSITY Progesterone Receptor: POSITIVE,> 95% STRONG STAINING INTENSITY Proliferation Marker Ki-67: 30.  The patient is a 50 year old female.   Past Surgical History Mertha Finders, CMA; 07/07/2020 9:23 AM) Breast Biopsy Right.  Diagnostic Studies History Mertha Finders, CMA; 07/07/2020 9:23  AM) Colonoscopy never Mammogram 1-3 years ago Pap Smear 1-5 years ago  Allergies (Kheana Marshall-McBride, CMA; 07/07/2020 9:23 AM) No  Known Drug Allergies [07/07/2020]: Allergies Reconciled  Medication History (Kheana Marshall-McBride, CMA; 07/07/2020 9:24 AM) Cetirizine HCl (10MG  Tablet, Oral) Active. Citalopram Hydrobromide (20MG  Tablet, Oral) Active. Loratadine (10MG  Tablet, Oral) Active. Metoprolol Tartrate (50MG  Tablet, Oral) Active. Pantoprazole Sodium (40MG  Tablet DR, Oral) Active. Medications Reconciled  Family History Mertha Finders, CMA; 07/07/2020 9:23 AM) First Degree Relatives No pertinent family history  Pregnancy / Birth History Mertha Finders, CMA; 07/07/2020 9:23 AM) Age at menarche 11 years. Age of menopause 17-55 Gravida 2 Irregular periods Maternal age 29-20  Other Problems Mertha Finders, CMA; 07/07/2020 9:23 AM) Breast Cancer     Review of Systems Zigmund Gottron Marshall-McBride CMA; 07/07/2020 9:23 AM) General Not Present- Appetite Loss.  Vitals (Kheana Marshall-McBride CMA; 07/07/2020 9:24 AM) 07/07/2020 9:24 AM Weight: 418.38 lb Height: 65in Body Surface Area: 2.71 m Body Mass Index: 69.62 kg/m  Temp.: 97.33F  Pulse: 84 (Regular)  P.OX: 97% (Room air) BP: 124/76(Sitting, Left Arm, Standard)        Physical Exam (Irvine Glorioso A. Analiyah Lechuga MD; 07/07/2020 12:35 PM)  General Mental Status-Alert. General Appearance-Consistent with stated age. Hydration-Well hydrated. Voice-Normal.  Breast Note: Large pendulous breasts. Bruising noted. No discernible mass on the right noted. The nipple is relatively flat. No significant dimpling though. Left nipple is normal. Left breast is otherwise normal.  Neurologic Neurologic evaluation reveals -alert and oriented x 3 with no impairment of recent or remote memory. Mental Status-Normal.  Musculoskeletal Normal Exam - Left-Upper  Extremity Strength Normal and Lower Extremity Strength Normal. Normal Exam - Right-Upper Extremity Strength Normal and Lower Extremity Strength Normal.  Lymphatic Head & Neck  General Head & Neck Lymphatics: Bilateral - Description - Normal. Axillary  General Axillary Region: Bilateral - Description - Normal. Tenderness - Non Tender.    Assessment & Plan (Khamila Bassinger A. Zalayah Pizzuto MD; 07/07/2020 12:36 PM)  STAGE 2 CARCINOMA OF BREAST, ER+ (C50.919) Impression: right 2.4 cm mass T2 N1 MX Lobular subtype Magnetic resonance imaging recommended if able. Due to her large BMI this may not be possible. If so, we can proceed because she desires right breast C localized lumpectomy with targeted right axillary sentinel lymph node mapping and seed localized right lymph node biopsy Refer to medical and radiation oncology Explaining the importance of weight loss in her case given her very large BMI and increased operative complication rate. Discussed mastectomy with reconstruction the patient is opted for breast conservation. Risk of lumpectomy include bleeding, infection, seroma, more surgery, use of seed/wire, wound care, cosmetic deformity and the need for other treatments, death , blood clots, death. Pt agrees to proceed. Risk of sentinel lymph node mapping include bleeding, infection, lymphedema, shoulder pain. stiffness, dye allergy. cosmetic deformity , blood clots, death, need for more surgery. Pt agrees to proceed.  Current Plans You are being scheduled for surgery- Our schedulers will call you.  You should hear from our office's scheduling department within 5 working days about the location, date, and time of surgery. We try to make accommodations for patient's preferences in scheduling surgery, but sometimes the OR schedule or the surgeon's schedule prevents [place] from making those accommodations.  If you have not heard from our office [phone]) in 5 working days, call the office and  ask for your surgeon's nurse.  If you have other questions about your diagnosis, plan, or surgery, call the office and ask for your surgeon's nurse.  Pt Education - [facility] Breast Cancer Information Given - [employer] "Breast Journey" Package We discussed the staging and pathophysiology of breast cancer.  We discussed all of the different options for treatment for breast cancer including surgery, chemotherapy, radiation therapy, Herceptin, and antiestrogen therapy. We discussed a sentinel lymph node biopsy as she does not appear to having lymph node involvement right now. We discussed the performance of that with injection of radioactive tracer and blue dye. We discussed that she would have an incision underneath her axillary hairline. We discussed that there is a bout a 10-20% chance of having a positive node with a sentinel lymph node biopsy and we will await the permanent pathology to make any other first further decisions in terms of her treatment. One of these options might be to return to the operating room to perform an axillary lymph node dissection. We discussed about a 1-2% risk lifetime of chronic shoulder pain as well as lymphedema associated with a sentinel lymph node biopsy. We discussed the options for treatment of the breast cancer which included lumpectomy versus a mastectomy. We discussed the performance of the lumpectomy with a wire placement. We discussed a 10-20% chance of a positive margin requiring reexcision in the operating room. We also discussed that she may need radiation therapy or antiestrogen therapy or both if she undergoes lumpectomy. We discussed the mastectomy and the postoperative care for that as well. We discussed that there is no difference in her survival whether she undergoes lumpectomy with radiation therapy or antiestrogen therapy versus a mastectomy. There is a slight difference in the local recurrence rate being 3-5% with lumpectomy and about 1% with a mastectomy. We  discussed the risks of operation including bleeding, infection, possible reoperation. She understands her further therapy will be based on what her stages at the time of her operation.  Pt Education - [facility] breast cancer surgery: discussed with patient and provided information. Pt Education - ABC (After Breast Cancer) Class Info: discussed with patient and provided information.

## 2020-07-10 ENCOUNTER — Encounter: Payer: Self-pay | Admitting: Family Medicine

## 2020-07-11 ENCOUNTER — Encounter: Payer: Self-pay | Admitting: Nurse Practitioner

## 2020-07-11 ENCOUNTER — Other Ambulatory Visit (HOSPITAL_COMMUNITY)
Admission: RE | Admit: 2020-07-11 | Discharge: 2020-07-11 | Disposition: A | Discharge status: Home / Self Care | Payer: Self-pay | Source: Ambulatory Visit | Attending: Nurse Practitioner | Admitting: Nurse Practitioner

## 2020-07-11 ENCOUNTER — Ambulatory Visit (INDEPENDENT_AMBULATORY_CARE_PROVIDER_SITE_OTHER): Payer: Self-pay | Admitting: Nurse Practitioner

## 2020-07-11 ENCOUNTER — Telehealth: Payer: Self-pay | Admitting: Hematology and Oncology

## 2020-07-11 ENCOUNTER — Other Ambulatory Visit: Payer: Self-pay

## 2020-07-11 VITALS — BP 134/90 | HR 91 | Temp 98.4°F | Ht 65.0 in | Wt >= 6400 oz

## 2020-07-11 DIAGNOSIS — R002 Palpitations: Secondary | ICD-10-CM

## 2020-07-11 DIAGNOSIS — I10 Essential (primary) hypertension: Secondary | ICD-10-CM

## 2020-07-11 DIAGNOSIS — Z0000 Encounter for general adult medical examination without abnormal findings: Secondary | ICD-10-CM | POA: Insufficient documentation

## 2020-07-11 DIAGNOSIS — Z5189 Encounter for other specified aftercare: Secondary | ICD-10-CM | POA: Insufficient documentation

## 2020-07-11 DIAGNOSIS — F419 Anxiety disorder, unspecified: Secondary | ICD-10-CM

## 2020-07-11 DIAGNOSIS — S31109A Unspecified open wound of abdominal wall, unspecified quadrant without penetration into peritoneal cavity, initial encounter: Secondary | ICD-10-CM

## 2020-07-11 DIAGNOSIS — Z0001 Encounter for general adult medical examination with abnormal findings: Secondary | ICD-10-CM

## 2020-07-11 DIAGNOSIS — J301 Allergic rhinitis due to pollen: Secondary | ICD-10-CM

## 2020-07-11 LAB — SURGICAL PATHOLOGY

## 2020-07-11 MED ORDER — NYSTATIN 100000 UNIT/GM EX POWD: 1.0000 "application " | Freq: Three times a day (TID) | CUTANEOUS | 0 refills | Status: DC

## 2020-07-11 MED ORDER — PANTOPRAZOLE SODIUM 40 MG PO TBEC: 40.0000 mg | DELAYED_RELEASE_TABLET | Freq: Every day | ORAL | 0 refills | Status: DC

## 2020-07-11 MED ORDER — CEFTRIAXONE SODIUM 1 G IJ SOLR
1.0000 g | Freq: Once | INTRAMUSCULAR | Status: AC
  Administered 2020-07-11: 1 g via INTRAMUSCULAR

## 2020-07-11 MED ORDER — MUPIROCIN CALCIUM 2 % EX CREA: 1.0000 "application " | TOPICAL_CREAM | Freq: Two times a day (BID) | CUTANEOUS | 0 refills | Status: DC

## 2020-07-11 MED ORDER — CEPHALEXIN 500 MG PO CAPS: 500.0000 mg | ORAL_CAPSULE | Freq: Two times a day (BID) | ORAL | 0 refills | Status: DC

## 2020-07-11 MED ORDER — TOLTERODINE TARTRATE ER 2 MG PO CP24: 2.0000 mg | ORAL_CAPSULE | Freq: Every day | ORAL | 1 refills | Status: DC

## 2020-07-11 MED ORDER — BUPROPION HCL ER (XL) 150 MG PO TB24: 150.0000 mg | ORAL_TABLET | Freq: Every day | ORAL | 0 refills | Status: DC

## 2020-07-11 MED ORDER — CETIRIZINE HCL 10 MG PO TABS: 10.0000 mg | ORAL_TABLET | Freq: Every day | ORAL | 7 refills | Status: DC

## 2020-07-11 MED ORDER — METOPROLOL TARTRATE 50 MG PO TABS: 50.0000 mg | ORAL_TABLET | Freq: Two times a day (BID) | ORAL | 6 refills | Status: DC

## 2020-07-11 NOTE — Assessment & Plan Note (Signed)
Anxiety not well controlled on Celexa 20 mg tablet by mouth daily. Started patient on Wellbutrin 150 mg XL. Education provided with printed handouts given to patient. Follow-up in 6 weeks. Rx sent to pharmacy.

## 2020-07-11 NOTE — Telephone Encounter (Signed)
Received a new pt referral from Dr. Brantley Stage for new dx of breast cancer. Shelby [name] has been cld and scheduled to see Dr. Lindi Adie on 1/17 at 1030am. Pt aware to arrive 20 minutes early.

## 2020-07-11 NOTE — Progress Notes (Addendum)
Established Patient Office Visit  Subjective:  Patient ID: Shelby [name], female    DOB: [birth year]  Age: 50 y.o. MRN: [id number]  CC:  Chief Complaint  Patient presents with   Annual Exam    HPI Shelby [name] presents for .   Encounter for general adult medical examination Physical: Patient's last physical exam was a few year ago .  Weight: Appropriate for height (BMI less than 27%) ; no Blood Pressure: Normal (BP less than 120/80) ; no Medical History: Patient history reviewed ; Family history reviewed ; yes Allergies Reviewed: No change in current allergies ; no Medications Reviewed: Medications reviewed - no changes ;  Lipids: Normal lipid levels ; lipids drawn results pending Smoking: Life-long non-smoker ; yes Physical Activity: Exercises at least 3 times per week ; no Alcohol/Drug Use: Is a non-drinker ; No illicit drug use  Patient is afflicted from Stress Incontinence and Urge Incontinence  Safety: reviewed ; Patient wears a seat belt, has smoke detectors, has carbon monoxide detectors and wears sunscreen with extended sun exposure. Dental Care: biannual cleanings: No  brushes and flosses daily. Yes  ophthalmology/Optometry: Annual visit.  Hearing loss: none Vision impairments: none  Past Medical History:  Diagnosis Date   Cancer ([place])    right breast   Heart palpitations     Past Surgical History:  Procedure Laterality Date   CHOLECYSTECTOMY     HERNIA REPAIR     lumpectomy right breast      Family History  Adopted: Yes    Social History   Socioeconomic History   Marital status: Married    Spouse name: Not on file   Number of children: Not on file   Years of education: Not on file   Highest education level: Not on file  Occupational History   Not on file  Tobacco Use   Smoking status: Never Smoker   Smokeless tobacco: Never Used  Vaping Use   Vaping Use: Never used  Substance and Sexual Activity   Alcohol use: No   Drug use:  No   Sexual activity: Yes    Partners: Male    Birth control/protection: Pill  Other Topics Concern   Not on file  Social History Narrative   Not on file   Social Determinants of Health   Financial Resource Strain: Not on file  Food Insecurity: Not on file  Transportation Needs: Not on file  Physical Activity: Not on file  Stress: Not on file  Social Connections: Not on file  Intimate Partner Violence: Not on file    Outpatient Medications Prior to Visit  Medication Sig Dispense Refill   metoprolol tartrate (LOPRESSOR) 50 MG tablet Take 1 tablet (50 mg total) by mouth 2 (two) times daily. 60 tablet 6   ALPRAZolam (XANAX) 0.5 MG tablet Take 1 tablet (0.5 mg total) by mouth at bedtime as needed for anxiety. (Patient not taking: Reported on 07/11/2020) 40 tablet 0   cetirizine (ZYRTEC) 10 MG tablet TAKE ONE TABLET BY MOUTH ONCE DAILY (Patient not taking: Reported on 07/11/2020) 30 tablet 7   citalopram (CELEXA) 20 MG tablet Take 1 tablet (20 mg total) by mouth daily. (Needs to be seen before next refill) (Patient not taking: Reported on 07/11/2020) 30 tablet 11   cyclobenzaprine (FLEXERIL) 10 MG tablet Take 1 tablet (10 mg total) by mouth 3 (three) times daily as needed for muscle spasms. (Patient not taking: Reported on 07/11/2020) 30 tablet 0   fluticasone (FLONASE) 50  MCG/ACT nasal spray Place 2 sprays into both nostrils daily. (Patient not taking: Reported on 07/11/2020) 16 g 11   loratadine (CLARITIN) 10 MG tablet Take 1 tablet (10 mg total) by mouth daily. (Patient not taking: Reported on 07/11/2020) 30 tablet 2   pantoprazole (PROTONIX) 40 MG tablet Take 1 tablet (40 mg total) by mouth daily. (Needs to be seen before next refill) (Patient not taking: Reported on 07/11/2020) 30 tablet 0   predniSONE (DELTASONE) 20 MG tablet Take 3 tabs daily for 1 week, then 2 tabs daily for week 2, then 1 tab daily for week 3. (Patient not taking: Reported on 07/11/2020) 42 tablet 0   No  facility-administered medications prior to visit.    Allergies  Allergen Reactions   Codeine Swelling    ROS Review of Systems  Constitutional: Negative.   HENT: Negative.   Eyes: Negative.   Respiratory: Negative.   Cardiovascular: Negative.   Gastrointestinal: Negative.   Genitourinary: Negative for genital sores, menstrual problem, vaginal bleeding, vaginal discharge and vaginal pain.       No menstruation for 1 year  Skin: Positive for color change, rash and wound.  Psychiatric/Behavioral: Negative for agitation, behavioral problems, confusion, self-injury and suicidal ideas. The patient is nervous/anxious.   All other systems reviewed and are negative.     Objective:    Physical Exam Vitals reviewed.  Constitutional:      General: She is awake.     Appearance: Normal appearance. She is well-groomed and overweight.     Interventions: Face mask in place.  HENT:     Head: Normocephalic.     Right Ear: External ear normal. There is no impacted cerumen.     Left Ear: External ear normal. There is no impacted cerumen.     Nose: Nose normal.     Mouth/Throat:     Mouth: Mucous membranes are moist.     Pharynx: Oropharynx is clear.  Eyes:     General:        Right eye: No discharge.        Left eye: No discharge.     Conjunctiva/sclera: Conjunctivae normal.     Pupils: Pupils are equal, round, and reactive to light.  Cardiovascular:     Rate and Rhythm: Normal rate and regular rhythm.     Pulses: Normal pulses.     Heart sounds: Normal heart sounds.  Pulmonary:     Effort: Pulmonary effort is normal.     Breath sounds: Normal breath sounds.  Abdominal:     General: Bowel sounds are normal.  Genitourinary:    Vagina: No vaginal discharge.     Comments: Skin break down on Pubic mound Musculoskeletal:     Cervical back: Normal range of motion.     Comments: Limited ROM   Skin:    Findings: Bruising, erythema and wound present.          Comments: Wound  under abdominal skin fold, from a recent fall, skin break down on right and left groin area, wound on left lateral lower quadrant of abdomen  Neurological:     Mental Status: She is alert and oriented to person, place, and time.  Psychiatric:        Behavior: Behavior is cooperative.     Comments: Anxiety and depression     BP 134/90    Pulse 91    Temp 98.4 F (36.9 C)    Ht 5\' 5"  (1.651 m)    Wt Marland Kitchen)  414 lb 6.4 oz (188 kg)    SpO2 96%    BMI 68.96 kg/m  Wt Readings from Last 3 Encounters:  07/11/20 (!) 414 lb 6.4 oz (188 kg)  09/01/18 (!) 395 lb (179.2 kg)  08/07/18 (!) 395 lb 12.8 oz (179.5 kg)     Health Maintenance Due  Topic Date Due   Hepatitis C Screening  Never done   HIV Screening  Never done   PAP SMEAR-Modifier  Never done   COLONOSCOPY (Pts 45-4yrs Insurance coverage will need to be confirmed)  Never done   COVID-19 Vaccine (3 - Pfizer risk 4-dose series) 11/13/2019    There are no preventive care reminders to display for this patient.     Assessment & Plan:   [place] Office Visit from 07/11/2020 in [place]  PHQ-9 Total Score 7     GAD 7 : Generalized Anxiety Score 07/11/2020  Nervous, Anxious, on Edge 2  Control/stop worrying 1  Worry too much - different things 1  Trouble relaxing 2  Restless 1  Easily annoyed or irritable 0  Afraid - awful might happen 0  Total GAD 7 Score 7  Anxiety Difficulty Not difficult at all    Problem List Items Addressed This Visit      Cardiovascular and Mediastinum   Essential hypertension    Essential hypertension well-controlled on metoprolol 50 mg tablet by mouth twice daily. No changes to current dose. Continue low-sodium diet and exercise regimen as tolerated. Rx refill sent to pharmacy.      Relevant Medications   metoprolol tartrate (LOPRESSOR) 50 MG tablet     Respiratory   Allergic rhinitis   Relevant Medications   cetirizine (ZYRTEC) 10 MG tablet     Other    Palpitations   Relevant Medications   metoprolol tartrate (LOPRESSOR) 50 MG tablet   Anxiety    Anxiety not well controlled on Celexa 20 mg tablet by mouth daily. Started patient on Wellbutrin 150 mg XL. Education provided with printed handouts given to patient. Follow-up in 6 weeks. Rx sent to pharmacy.      Relevant Medications   buPROPion (WELLBUTRIN XL) 150 MG 24 hr tablet   Annual physical exam - Primary    Patient is a 50 year old female who presents to clinic for annual physical exam. Patient is a newly diagnosed stage II breast cancer, she is currently being followed by oncology. Completed head to toe assessment. Provided education to patient on health maintenance and preventative care. Printed handouts given. Completed labs-CBC, lipid panel, CMP, Cologuard, HIV antibiotic, and hepatitis C antibiotic.  Results pending. Follow-up annual physical in 1 year.      Relevant Orders   Lipid Panel   Cytology - PAP([place])   HIV antibody (with reflex)   Hepatitis C antibody   Cologuard   CBC with Differential   Comprehensive metabolic panel   Wound check, abscess    Patient has multiple wounds under skin fold and bilateral groin area. Wound is beefy red in color, erythema, not warm to touch. Patient denies fever or chills, wound bed looks healthy without necrotic exudate. Provided education to patient with printed handouts given on wound care and looking out for signs and symptoms of infection. Advised patient to wash skin with Dial soap, keep skin dry, started patient on Keflex 500 mg capsule twice daily, mupirocin ointment, nystatin powder,  Follow-up in 2 weeks. Eric sent to pharmacy.         Meds  ordered this encounter  Medications   cephALEXin (KEFLEX) 500 MG capsule    Sig: Take 1 capsule (500 mg total) by mouth 2 (two) times daily.    Dispense:  14 capsule    Refill:  0    Order Specific Question:   Supervising Provider    Answer:   Janora Norlander [[id number]]    nystatin (MYCOSTATIN/NYSTOP) powder    Sig: Apply 1 application topically 3 (three) times daily.    Dispense:  15 g    Refill:  0    Order Specific Question:   Supervising Provider    Answer:   Janora Norlander [[id number]]   mupirocin cream (BACTROBAN) 2 %    Sig: Apply 1 application topically 2 (two) times daily.    Dispense:  15 g    Refill:  0    Order Specific Question:   Supervising Provider    Answer:   Janora Norlander [[id number]]   metoprolol tartrate (LOPRESSOR) 50 MG tablet    Sig: Take 1 tablet (50 mg total) by mouth 2 (two) times daily.    Dispense:  60 tablet    Refill:  6    Order Specific Question:   Supervising Provider    Answer:   Janora Norlander [[id number]]   pantoprazole (PROTONIX) 40 MG tablet    Sig: Take 1 tablet (40 mg total) by mouth daily. (Needs to be seen before next refill)    Dispense:  30 tablet    Refill:  0    Order Specific Question:   Supervising Provider    Answer:   Janora Norlander [[id number]]   cetirizine (ZYRTEC) 10 MG tablet    Sig: Take 1 tablet (10 mg total) by mouth daily.    Dispense:  30 tablet    Refill:  7    Order Specific Question:   Supervising Provider    Answer:   Janora Norlander [[id number]]   tolterodine (DETROL LA) 2 MG 24 hr capsule    Sig: Take 1 capsule (2 mg total) by mouth daily.    Dispense:  60 capsule    Refill:  1    Order Specific Question:   Supervising Provider    Answer:   Janora Norlander [[id number]]   buPROPion (WELLBUTRIN XL) 150 MG 24 hr tablet    Sig: Take 1 tablet (150 mg total) by mouth daily.    Dispense:  60 tablet    Refill:  0    Order Specific Question:   Supervising Provider    Answer:   Janora Norlander [[id number]]   cefTRIAXone (ROCEPHIN) injection 1 g    Follow-up: Return in about 1 year (around 07/11/2021).    [name], NP

## 2020-07-11 NOTE — Patient Instructions (Signed)
Health Maintenance, Female Adopting a healthy lifestyle and getting preventive care are important in promoting health and wellness. Ask your health care provider about:  The right schedule for you to have regular tests and exams.  Things you can do on your own to prevent diseases and keep yourself healthy. What should I know about diet, weight, and exercise? Eat a healthy diet  Eat a diet that includes plenty of vegetables, fruits, low-fat dairy products, and lean protein.  Do not eat a lot of foods that are high in solid fats, added sugars, or sodium.   Maintain a healthy weight Body mass index (BMI) is used to identify weight problems. It estimates body fat based on height and weight. Your health care provider can help determine your BMI and help you achieve or maintain a healthy weight. Get regular exercise Get regular exercise. This is one of the most important things you can do for your health. Most adults should:  Exercise for at least 150 minutes each week. The exercise should increase your heart rate and make you sweat (moderate-intensity exercise).  Do strengthening exercises at least twice a week. This is in addition to the moderate-intensity exercise.  Spend less time sitting. Even light physical activity can be beneficial. Watch cholesterol and blood lipids Have your blood tested for lipids and cholesterol at 52 years of age, then have this test every 5 years. Have your cholesterol levels checked more often if:  Your lipid or cholesterol levels are high.  You are older than 52 years of age.  You are at high risk for heart disease. What should I know about cancer screening? Depending on your health history and family history, you may need to have cancer screening at various ages. This may include screening for:  Breast cancer.  Cervical cancer.  Colorectal cancer.  Skin cancer.  Lung cancer. What should I know about heart disease, diabetes, and high blood  pressure? Blood pressure and heart disease  High blood pressure causes heart disease and increases the risk of stroke. This is more likely to develop in people who have high blood pressure readings, are of African descent, or are overweight.  Have your blood pressure checked: ? Every 3-5 years if you are 18-39 years of age. ? Every year if you are 40 years old or older. Diabetes Have regular diabetes screenings. This checks your fasting blood sugar level. Have the screening done:  Once every three years after age 40 if you are at a normal weight and have a low risk for diabetes.  More often and at a younger age if you are overweight or have a high risk for diabetes. What should I know about preventing infection? Hepatitis B If you have a higher risk for hepatitis B, you should be screened for this virus. Talk with your health care provider to find out if you are at risk for hepatitis B infection. Hepatitis C Testing is recommended for:  Everyone born from 1945 through 1965.  Anyone with known risk factors for hepatitis C. Sexually transmitted infections (STIs)  Get screened for STIs, including gonorrhea and chlamydia, if: ? You are sexually active and are younger than 52 years of age. ? You are older than 52 years of age and your health care provider tells you that you are at risk for this type of infection. ? Your sexual activity has changed since you were last screened, and you are at increased risk for chlamydia or gonorrhea. Ask your health care provider   if you are at risk.  Ask your health care provider about whether you are at high risk for HIV. Your health care provider may recommend a prescription medicine to help prevent HIV infection. If you choose to take medicine to prevent HIV, you should first get tested for HIV. You should then be tested every 3 months for as long as you are taking the medicine. Pregnancy  If you are about to stop having your period (premenopausal) and  you may become pregnant, seek counseling before you get pregnant.  Take 400 to 800 micrograms (mcg) of folic acid every day if you become pregnant.  Ask for birth control (contraception) if you want to prevent pregnancy. Osteoporosis and menopause Osteoporosis is a disease in which the bones lose minerals and strength with aging. This can result in bone fractures. If you are 65 years old or older, or if you are at risk for osteoporosis and fractures, ask your health care provider if you should:  Be screened for bone loss.  Take a calcium or vitamin D supplement to lower your risk of fractures.  Be given hormone replacement therapy (HRT) to treat symptoms of menopause. Follow these instructions at home: Lifestyle  Do not use any products that contain nicotine or tobacco, such as cigarettes, e-cigarettes, and chewing tobacco. If you need help quitting, ask your health care provider.  Do not use street drugs.  Do not share needles.  Ask your health care provider for help if you need support or information about quitting drugs. Alcohol use  Do not drink alcohol if: ? Your health care provider tells you not to drink. ? You are pregnant, may be pregnant, or are planning to become pregnant.  If you drink alcohol: ? Limit how much you use to 0-1 drink a day. ? Limit intake if you are breastfeeding.  Be aware of how much alcohol is in your drink. In the [place] one drink equals one 12 oz bottle of beer (355 mL), one 5 oz glass of wine (148 mL), or one 1 oz glass of hard liquor (44 mL). General instructions  Schedule regular health, dental, and eye exams.  Stay current with your vaccines.  Tell your health care provider if: ? You often feel depressed. ? You have ever been abused or do not feel safe at home. Summary  Adopting a healthy lifestyle and getting preventive care are important in promoting health and wellness.  Follow your health care provider's instructions about healthy  diet, exercising, and getting tested or screened for diseases.  Follow your health care provider's instructions on monitoring your cholesterol and blood pressure. This information is not intended to replace advice given to you by your health care provider. Make sure you discuss any questions you have with your health care provider. Document Revised: 06/10/2018 Document Reviewed: 06/10/2018 Elsevier Patient Education  2021 Elsevier Inc.  

## 2020-07-11 NOTE — Assessment & Plan Note (Signed)
Essential hypertension well-controlled on metoprolol 50 mg tablet by mouth twice daily. No changes to current dose. Continue low-sodium diet and exercise regimen as tolerated. Rx refill sent to pharmacy.

## 2020-07-11 NOTE — Assessment & Plan Note (Addendum)
Patient has multiple wounds under skin fold and bilateral groin area. Wound is beefy red in color, erythema, not warm to touch. Patient denies fever or chills, wound bed looks healthy without necrotic exudate. Provided education to patient with printed handouts given on wound care and looking out for signs and symptoms of infection. Advised patient to wash skin with Dial soap, keep skin dry, started patient on Keflex 500 mg capsule twice daily, mupirocin ointment, nystatin powder, 1 gram of rocephin given in clinic.  Follow-up in 2 weeks. Rx sent to pharmacy.

## 2020-07-11 NOTE — Assessment & Plan Note (Addendum)
Patient is a 50 year old female who presents to clinic for annual physical exam. Patient is a newly diagnosed stage II breast cancer, she is currently being followed by oncology. Completed head to toe assessment. Provided education to patient on health maintenance and preventative care. Printed handouts given. Completed labs-CBC, Pap, lipid panel, CMP, Cologuard, HIV antibiotic, and hepatitis C antibiotic.  Results pending. Follow-up annual physical in 1 year.

## 2020-07-12 ENCOUNTER — Telehealth: Payer: Self-pay

## 2020-07-12 ENCOUNTER — Other Ambulatory Visit: Payer: Self-pay | Admitting: Surgery

## 2020-07-12 DIAGNOSIS — C50919 Malignant neoplasm of unspecified site of unspecified female breast: Secondary | ICD-10-CM

## 2020-07-12 DIAGNOSIS — Z170 Estrogen receptor positive status [ER+]: Secondary | ICD-10-CM

## 2020-07-12 LAB — CBC WITH DIFFERENTIAL/PLATELET
Basophils Absolute: 0.1 10*3/uL (ref 0.0–0.2)
Basos: 1 %
EOS (ABSOLUTE): 0.1 10*3/uL (ref 0.0–0.4)
Eos: 1 %
Hematocrit: 47 % — ABNORMAL HIGH (ref 34.0–46.6)
Hemoglobin: 16 g/dL — ABNORMAL HIGH (ref 11.1–15.9)
Immature Grans (Abs): 0.1 10*3/uL (ref 0.0–0.1)
Immature Granulocytes: 1 %
Lymphocytes Absolute: 1.8 10*3/uL (ref 0.7–3.1)
Lymphs: 18 %
MCH: 29.6 pg (ref 26.6–33.0)
MCHC: 34 g/dL (ref 31.5–35.7)
MCV: 87 fL (ref 79–97)
Monocytes Absolute: 0.4 10*3/uL (ref 0.1–0.9)
Monocytes: 4 %
Neutrophils Absolute: 7.7 10*3/uL — ABNORMAL HIGH (ref 1.4–7.0)
Neutrophils: 75 %
Platelets: 334 10*3/uL (ref 150–450)
RBC: 5.4 x10E6/uL — ABNORMAL HIGH (ref 3.77–5.28)
RDW: 12.7 % (ref 11.7–15.4)
WBC: 10.1 10*3/uL (ref 3.4–10.8)

## 2020-07-12 LAB — COMPREHENSIVE METABOLIC PANEL
ALT: 42 IU/L — ABNORMAL HIGH (ref 0–32)
AST: 26 IU/L (ref 0–40)
Albumin/Globulin Ratio: 1.6 (ref 1.2–2.2)
Albumin: 4.6 g/dL (ref 3.8–4.9)
Alkaline Phosphatase: 112 IU/L (ref 44–121)
BUN/Creatinine Ratio: 15 (ref 9–23)
BUN: 11 mg/dL (ref 6–24)
Bilirubin Total: 0.4 mg/dL (ref 0.0–1.2)
CO2: 20 mmol/L (ref 20–29)
Calcium: 9.1 mg/dL (ref 8.7–10.2)
Chloride: 103 mmol/L (ref 96–106)
Creatinine, Ser: 0.75 mg/dL (ref 0.57–1.00)
GFR calc Af Amer: 107 mL/min/{1.73_m2} (ref >59)
GFR calc non Af Amer: 93 mL/min/{1.73_m2} (ref >59)
Globulin, Total: 2.8 g/dL (ref 1.5–4.5)
Glucose: 113 mg/dL — ABNORMAL HIGH (ref 65–99)
Potassium: 4.5 mmol/L (ref 3.5–5.2)
Sodium: 145 mmol/L — ABNORMAL HIGH (ref 134–144)
Total Protein: 7.4 g/dL (ref 6.0–8.5)

## 2020-07-12 LAB — LIPID PANEL
Chol/HDL Ratio: 5 ratio — ABNORMAL HIGH (ref 0.0–4.4)
Cholesterol, Total: 154 mg/dL (ref 100–199)
HDL: 31 mg/dL — ABNORMAL LOW (ref >39)
LDL Chol Calc (NIH): 91 mg/dL (ref 0–99)
Triglycerides: 184 mg/dL — ABNORMAL HIGH (ref 0–149)
VLDL Cholesterol Cal: 32 mg/dL (ref 5–40)

## 2020-07-12 LAB — HEPATITIS C ANTIBODY: Hep C Virus Ab: <0.1 s/co ratio (ref 0.0–0.9)

## 2020-07-12 LAB — HIV ANTIBODY (ROUTINE TESTING W REFLEX): HIV Screen 4th Generation wRfx: NONREACTIVE

## 2020-07-12 NOTE — Telephone Encounter (Signed)
Patient aware and verbalizes understanding. 

## 2020-07-12 NOTE — Telephone Encounter (Signed)
See if she can use good RX, it should bring it down to about 25 or 30 if that's still too much we can try a different medication.

## 2020-07-13 ENCOUNTER — Other Ambulatory Visit: Payer: Self-pay | Admitting: Nurse Practitioner

## 2020-07-13 DIAGNOSIS — R7309 Other abnormal glucose: Secondary | ICD-10-CM

## 2020-07-13 NOTE — Telephone Encounter (Signed)
I don't know how much all the medication cost by heart, I will copy [facility] to help with recommendation. Myrbetriq another option but am not sure what out of pocket cost will be.

## 2020-07-13 NOTE — Telephone Encounter (Signed)
In talking with pharmacist at [facility]  She looked up Good[facility] but is finding plain Detrol for $25 / $30, not the Detrol LA Can patient try something else that is a little cheaper

## 2020-07-14 ENCOUNTER — Telehealth: Payer: Self-pay | Admitting: Hematology and Oncology

## 2020-07-14 ENCOUNTER — Other Ambulatory Visit: Payer: Self-pay | Admitting: Surgery

## 2020-07-14 ENCOUNTER — Ambulatory Visit (INDEPENDENT_AMBULATORY_CARE_PROVIDER_SITE_OTHER): Payer: Self-pay | Admitting: Family

## 2020-07-14 ENCOUNTER — Encounter: Payer: Self-pay | Admitting: Family

## 2020-07-14 DIAGNOSIS — C50911 Malignant neoplasm of unspecified site of right female breast: Secondary | ICD-10-CM

## 2020-07-14 DIAGNOSIS — H65191 Other acute nonsuppurative otitis media, right ear: Secondary | ICD-10-CM

## 2020-07-14 LAB — CYTOLOGY - PAP
Comment: NEGATIVE
Diagnosis: NEGATIVE
High risk HPV: NEGATIVE

## 2020-07-14 MED ORDER — AMOXICILLIN-POT CLAVULANATE 875-125 MG PO TABS: 1.0000 | ORAL_TABLET | Freq: Two times a day (BID) | ORAL | 0 refills | Status: DC

## 2020-07-14 NOTE — Telephone Encounter (Signed)
Thank you Julie

## 2020-07-14 NOTE — Telephone Encounter (Signed)
Please advise what you would like for [place] to do.

## 2020-07-14 NOTE — Telephone Encounter (Signed)
Pt has been cld and rescheduled to see Dr. Lindi Adie on 1/17 at 11am.

## 2020-07-14 NOTE — Progress Notes (Signed)
   Virtual Visit via telephone Note Due to COVID-19 pandemic this visit was conducted virtually. This visit type was conducted due to national recommendations for restrictions regarding the COVID-19 Pandemic (e.g. social distancing, sheltering in place) in an effort to limit this patient's exposure and mitigate transmission in our community. All issues noted in this document were discussed and addressed.  A physical exam was not performed with this format.  I connected with Shelby [name] on 07/14/20 at 8:56 AM  by telephone and verified that I am speaking with the correct person using two identifiers. Shelby [name] is currently located at home and no one is currently with her during visit. The provider, Evelina Dun, FNP is located in their office at time of visit.  I discussed the limitations, risks, security and privacy concerns of performing an evaluation and management service by telephone and the availability of in person appointments. I also discussed with the patient that there may be a patient responsible charge related to this service. The patient expressed understanding and agreed to proceed.   History and Present Illness:  Otalgia  There is pain in the right ear. This is a new problem. The current episode started yesterday. The problem occurs every few minutes. The problem has been waxing and waning. There has been no fever. The pain is at a severity of 5/10. The pain is mild. Pertinent negatives include no coughing, diarrhea, headaches, hearing loss, rhinorrhea or sore throat. Associated symptoms comments: Congestion . She has tried NSAIDs for the symptoms. The treatment provided mild relief.      Review of Systems  HENT: Positive for ear pain. Negative for hearing loss, rhinorrhea and sore throat.   Respiratory: Negative for cough.   Gastrointestinal: Negative for diarrhea.  Neurological: Negative for headaches.  All other systems reviewed and are  negative.    Observations/Objective: No SOB or distress noted  Assessment and Plan: 1. Other acute nonsuppurative otitis media of right ear, recurrence not specified Pt will start Keflex and start Augmentin  Tylenol as needed Start Zyrtec and Flonase Call if symptoms worsen or do not improve  - amoxicillin-clavulanate (AUGMENTIN) 875-125 MG tablet; Take 1 tablet by mouth 2 (two) times daily.  Dispense: 14 tablet; Refill: 0    I discussed the assessment and treatment plan with the patient. The patient was provided an opportunity to ask questions and all were answered. The patient agreed with the plan and demonstrated an understanding of the instructions.   The patient was advised to call back or seek an in-person evaluation if the symptoms worsen or if the condition fails to improve as anticipated.  The above assessment and management plan was discussed with the patient. The patient verbalized understanding of and has agreed to the management plan. Patient is aware to call the clinic if symptoms persist or worsen. Patient is aware when to return to the clinic for a follow-up visit. Patient educated on when it is appropriate to go to the emergency department.   Time call ended:  9:07 AM   I provided 11 minutes of non-face-to-face time during this encounter.    Evelina Dun, FNP

## 2020-07-14 NOTE — Telephone Encounter (Signed)
Myrbetriq is very expensive $$$  Would call in oxybutynin ER 5 mg daily Patient must go on Good RX to print coupon and take to pharmacy.  Would call in to local [facility] $16--that's as cheap as is gets.

## 2020-07-16 NOTE — Progress Notes (Signed)
[place] CONSULT NOTE  Patient Care Team: [name], NP as PCP - General (Nurse Practitioner)  CHIEF COMPLAINTS/PURPOSE OF CONSULTATION:  Newly diagnosed breast cancer  HISTORY OF PRESENTING ILLNESS:  Shelby [name] 50 y.o. female is here because of recent diagnosis of invasive lobular carcinoma of the right breast. Screening mammogram on 07/03/20 showed a right breast mass and right axillary adenopathy. Right breast nipple retraction and retroareolar thickening was palpable on exam. Diagnostic mammogram and [place] on 07/04/20 showed a 2.4cm mass and 2 abnormal lymph nodes. Biopsy on 07/04/20 showed invasive lobular carcinoma in the breast and lymph node, grade 1-2, HER-2 equivocal by IHC, (2+) negative by FISH (ratio 1.39), ER+ 95%, PR+ >95%, Ki67 30%. She presents to the clinic today for initial evaluation.  She is here today accompanied by her husband.  I reviewed her records extensively and collaborated the history with the patient.  SUMMARY OF ONCOLOGIC HISTORY: Oncology History  Malignant neoplasm of central portion of right breast in female, estrogen receptor positive ([place])  07/04/2020 Initial Diagnosis   Screening mammogram detected right breast mass and right axillary lymph nodes.  Right breast nipple retraction and retroareolar thickening.  2.4 cm mass with 2 abnormal lymph nodes.  Biopsy revealed invasive lobular cancer grade 1-2 ER 95%, PR 95%, Ki-67 30%, HER2 equivocal by IHC negative by FISH ratio 1.39   07/17/2020 Cancer Staging   Staging form: Breast, AJCC 8th Edition - Clinical stage from 07/17/2020: Stage IIA (cT2, cN1, cM0, G2, ER+, PR+, HER2-) - Signed by Nicholas Lose, MD on 07/17/2020     MEDICAL HISTORY:  Past Medical History:  Diagnosis Date   Cancer [facility])    right breast   Heart palpitations     SURGICAL HISTORY: Past Surgical History:  Procedure Laterality Date   CHOLECYSTECTOMY     HERNIA REPAIR     lumpectomy right breast       SOCIAL HISTORY: Social History   Socioeconomic History   Marital status: Married    Spouse name: Not on file   Number of children: Not on file   Years of education: Not on file   Highest education level: Not on file  Occupational History   Not on file  Tobacco Use   Smoking status: Never Smoker   Smokeless tobacco: Never Used  Vaping Use   Vaping Use: Never used  Substance and Sexual Activity   Alcohol use: No   Drug use: No   Sexual activity: Yes    Partners: Male    Birth control/protection: Pill  Other Topics Concern   Not on file  Social History Narrative   Not on file   Social Determinants of Health   Financial Resource Strain: Not on file  Food Insecurity: Not on file  Transportation Needs: Not on file  Physical Activity: Not on file  Stress: Not on file  Social Connections: Not on file  Intimate Partner Violence: Not on file    FAMILY HISTORY: Family History  Adopted: Yes    ALLERGIES:  is allergic to codeine.  MEDICATIONS:  Current Outpatient Medications  Medication Sig Dispense Refill   ALPRAZolam (XANAX) 0.5 MG tablet Take 1 tablet (0.5 mg total) by mouth at bedtime as needed for anxiety. (Patient not taking: Reported on 07/11/2020) 40 tablet 0   amoxicillin-clavulanate (AUGMENTIN) 875-125 MG tablet Take 1 tablet by mouth 2 (two) times daily. 14 tablet 0   buPROPion (WELLBUTRIN XL) 150 MG 24 hr tablet Take 1 tablet (  150 mg total) by mouth daily. 60 tablet 0   cephALEXin (KEFLEX) 500 MG capsule Take 1 capsule (500 mg total) by mouth 2 (two) times daily. 14 capsule 0   cetirizine (ZYRTEC) 10 MG tablet Take 1 tablet (10 mg total) by mouth daily. 30 tablet 7   citalopram (CELEXA) 20 MG tablet Take 1 tablet (20 mg total) by mouth daily. (Needs to be seen before next refill) (Patient not taking: Reported on 07/11/2020) 30 tablet 11   cyclobenzaprine (FLEXERIL) 10 MG tablet Take 1 tablet (10 mg total) by mouth 3 (three) times daily as  needed for muscle spasms. (Patient not taking: Reported on 07/11/2020) 30 tablet 0   fluticasone (FLONASE) 50 MCG/ACT nasal spray Place 2 sprays into both nostrils daily. (Patient not taking: Reported on 07/11/2020) 16 g 11   loratadine (CLARITIN) 10 MG tablet Take 1 tablet (10 mg total) by mouth daily. (Patient not taking: Reported on 07/11/2020) 30 tablet 2   metFORMIN (GLUCOPHAGE) 500 MG tablet Take 1 tablet (500 mg total) by mouth 2 (two) times daily with a meal. 90 tablet 0   metoprolol tartrate (LOPRESSOR) 50 MG tablet Take 1 tablet (50 mg total) by mouth 2 (two) times daily. 60 tablet 6   mupirocin cream (BACTROBAN) 2 % Apply 1 application topically 2 (two) times daily. 15 g 0   nystatin (MYCOSTATIN/NYSTOP) powder Apply 1 application topically 3 (three) times daily. 15 g 0   pantoprazole (PROTONIX) 40 MG tablet Take 1 tablet (40 mg total) by mouth daily. (Needs to be seen before next refill) 30 tablet 0   tolterodine (DETROL LA) 2 MG 24 hr capsule Take 1 capsule (2 mg total) by mouth daily. 60 capsule 1   No current facility-administered medications for this visit.    REVIEW OF SYSTEMS:   Constitutional: Denies fevers, chills or abnormal night sweats   Breast: Right breast nipple retraction All other systems were reviewed with the patient and are negative.  PHYSICAL EXAMINATION: ECOG PERFORMANCE STATUS: 2 - Symptomatic, <50% confined to bed patient uses a wheelchair for ambulation for long distances.  She is able to walk without assistance for her ADLs.  Vitals:   07/17/20 1123  BP: (!) 142/98  Pulse: 67  Resp: 17  Temp: 98.4 F (36.9 C)  SpO2: 98%   Filed Weights   07/17/20 1123  Weight: (!) 419 lb (190.1 kg)      LABORATORY DATA:  I have reviewed the data as listed Lab Results  Component Value Date   WBC 10.1 07/11/2020   HGB 16.0 (H) 07/11/2020   HCT 47.0 (H) 07/11/2020   MCV 87 07/11/2020   PLT 334 07/11/2020   Lab Results  Component Value Date   NA  145 (H) 07/11/2020   K 4.5 07/11/2020   CL 103 07/11/2020   CO2 20 07/11/2020    RADIOGRAPHIC STUDIES: I have personally reviewed the radiological reports and agreed with the findings in the report.  ASSESSMENT AND PLAN:  Malignant neoplasm of central portion of right breast in female, estrogen receptor positive ([place]) 07/04/2020:Screening mammogram detected right breast mass and right axillary lymph nodes.  Right breast nipple retraction and retroareolar thickening.  2.4 cm mass with 2 abnormal lymph nodes.  Biopsy revealed invasive lobular cancer grade 1-2 ER 95%, PR 95%, Ki-67 30%, HER2 equivocal by IHC negative by FISH ratio 1.39 T2N1 stage IIA  Pathology and radiology counseling: Discussed with the patient, the details of pathology including the type of breast  cancer,the clinical staging, the significance of ER, PR and HER-2/neu receptors and the implications for treatment. After reviewing the pathology in detail, we proceeded to discuss the different treatment options between surgery, radiation, chemotherapy, antiestrogen therapies.  Treatment plan: 1.  Breast conserving surgery with sentinel lymph node biopsy and targeted node dissection 2. MammaPrint testing to determine if she would benefit from systemic chemotherapy.  We will run MammaPrint if she has 3 or less positive lymph nodes.  Otherwise she will need systemic chemo. 3.  Adjuvant radiation therapy 4.  Adjuvant antiestrogen therapy.  We will consider anastrozole plus or minus abemaciclib  Return to clinic after surgery to discuss the final pathology report and to finalize a treatment plan.     All questions were answered. The patient knows to call the clinic with any problems, questions or concerns.   Rulon Eisenmenger, MD, MPH 07/17/2020    I, Molly Dorshimer, am acting as scribe for Nicholas Lose, MD.  I have reviewed the above documentation for accuracy and completeness, and I agree with the above.

## 2020-07-17 ENCOUNTER — Encounter: Payer: Self-pay | Admitting: *Deleted

## 2020-07-17 ENCOUNTER — Telehealth: Payer: Self-pay | Admitting: *Deleted

## 2020-07-17 ENCOUNTER — Inpatient Hospital Stay: Payer: Self-pay | Attending: Hematology and Oncology | Admitting: Hematology and Oncology

## 2020-07-17 ENCOUNTER — Other Ambulatory Visit: Payer: Self-pay | Admitting: Nurse Practitioner

## 2020-07-17 ENCOUNTER — Other Ambulatory Visit: Payer: Self-pay

## 2020-07-17 DIAGNOSIS — C50111 Malignant neoplasm of central portion of right female breast: Secondary | ICD-10-CM | POA: Insufficient documentation

## 2020-07-17 DIAGNOSIS — Z170 Estrogen receptor positive status [ER+]: Secondary | ICD-10-CM | POA: Insufficient documentation

## 2020-07-17 DIAGNOSIS — R7303 Prediabetes: Secondary | ICD-10-CM

## 2020-07-17 DIAGNOSIS — Z79899 Other long term (current) drug therapy: Secondary | ICD-10-CM | POA: Insufficient documentation

## 2020-07-17 LAB — SPECIMEN STATUS REPORT

## 2020-07-17 LAB — HGB A1C W/O EAG: Hgb A1c MFr Bld: 6.2 % — ABNORMAL HIGH (ref 4.8–5.6)

## 2020-07-17 MED ORDER — METFORMIN HCL 500 MG PO TABS: 500.0000 mg | ORAL_TABLET | Freq: Two times a day (BID) | ORAL | 0 refills | Status: DC

## 2020-07-17 NOTE — Assessment & Plan Note (Addendum)
07/04/2020:Screening mammogram detected right breast mass and right axillary lymph nodes.  Right breast nipple retraction and retroareolar thickening.  2.4 cm mass with 2 abnormal lymph nodes.  Biopsy revealed invasive lobular cancer grade 1-2 ER 95%, PR 95%, Ki-67 30%, HER2 equivocal by IHC negative by FISH ratio 1.39 T2N1 stage IIA  Pathology and radiology counseling: Discussed with the patient, the details of pathology including the type of breast cancer,the clinical staging, the significance of ER, PR and HER-2/neu receptors and the implications for treatment. After reviewing the pathology in detail, we proceeded to discuss the different treatment options between surgery, radiation, chemotherapy, antiestrogen therapies.  Treatment plan: 1.  Breast conserving surgery with sentinel lymph node biopsy and targeted node dissection 2. MammaPrint testing to determine if she would benefit from systemic chemotherapy.  We will run MammaPrint if she has 3 or less positive lymph nodes.  Otherwise she will need systemic chemo. 3.  Adjuvant radiation therapy 4.  Adjuvant antiestrogen therapy.  We will consider anastrozole plus or minus abemaciclib  Return to clinic after surgery to discuss the final pathology report and to finalize a treatment plan.

## 2020-07-17 NOTE — Telephone Encounter (Signed)
Spoke to pt, provided navigation resources and contact information. Denies questions or concerns regarding dx or treatment care plan. Confirmed MRI for 1/18. Encourage pt to call with needs. Received verbal understanding.

## 2020-07-18 ENCOUNTER — Other Ambulatory Visit: Payer: Self-pay

## 2020-07-18 NOTE — Progress Notes (Signed)
Location of Breast Cancer: Malignant neoplasm of central portion of RIGHT breast, estrogen receptor positive  Histology per Pathology Report: (definitive pathology pending upcoming lumpectomy) 07/04/2020 FINAL MICROSCOPIC DIAGNOSIS:  A. BREAST, RETROAREOLAR, RIGHT, BIOPSY:  - Invasive mammary carcinoma, see comment.  B. LYMPH NODE, AXILLA, RIGHT, BIOPSY:  - Metastatic carcinoma in a lymph node.  COMMENT:  A. The carcinoma appears grade 1-2 and measures 9 mm in greatest linear extent. E-cadherin will be ordered. Prognostic makers will be ordered  Receptor Status: ER(95%), PR (95%), Her2-neu (Negative via FISH), Ki-67(30%)  Did patient present with symptoms (if so, please note symptoms) or was this found on screening mammography?:  Screening mammogram on 07/03/20 showed a right breast mass and right axillary adenopathy. Right breast nipple retraction and retroareolar thickening was palpable on exam. Diagnostic mammogram and [place] on 07/04/20 showed a 2.4cm mass and 2 abnormal lymph nodes.  Past/Anticipated interventions by surgeon, if any: Scheduled for RIGHT BREAST LUMPECTOMY WITH RADIOACTIVE SEED AND RIGHT BREAST RADIOACTIVE SEED TARGETED SENTINEL LYMPH NODE BIOPSY, RIGHT BREAST SENTINEL LYMPH NODE MAPPING on 08/09/2020 by Dr. Erroll Luna  Past/Anticipated interventions by medical oncology, if any:  Under care of Dr. Nicholas Lose 07/18/2020 Treatment plan: 1.  Breast conserving surgery with sentinel lymph node biopsy and targeted node dissection (scheduled for 08/09/2020) 2. MammaPrint testing to determine if she would benefit from systemic chemotherapy.  We will run MammaPrint if she has 3 or less positive lymph nodes.  Otherwise she will need systemic chemo. 3.  Adjuvant radiation therapy 4.  Adjuvant antiestrogen therapy.  We will consider anastrozole plus or minus abemaciclib --Return to clinic after surgery to discuss the final pathology report and to finalize a treatment  plan.  Lymphedema issues, if any:  Patient denies   Pain issues, if any:  Patient denies  SAFETY ISSUES:  Prior radiation? No  Pacemaker/ICD? No  Possible current pregnancy? No--no menstrual cycle for ~1 year  Is the patient on methotrexate? No  Current Complaints / other details:   Patient has received both Pfizer vaccines but has not received booster yet. She has never received a flu shot

## 2020-07-19 ENCOUNTER — Ambulatory Visit
Admission: RE | Admit: 2020-07-19 | Discharge: 2020-07-19 | Disposition: A | Discharge status: Home / Self Care | Payer: Self-pay | Source: Ambulatory Visit | Attending: Radiation Oncology | Admitting: Radiation Oncology

## 2020-07-19 ENCOUNTER — Encounter: Payer: Self-pay | Admitting: Radiation Oncology

## 2020-07-19 DIAGNOSIS — Z170 Estrogen receptor positive status [ER+]: Secondary | ICD-10-CM

## 2020-07-19 NOTE — Progress Notes (Signed)
Radiation Oncology         [phone] ________________________________  Initial Outpatient Consultation by MyChart Video.  The patient opted for telemedicine to maximize safety during the pandemic.    Name: Shelby [name] MRN: [id number]  Date: 07/19/2020  DOB: [birth year]  WS:FKCLEX, Quinn Axe, NP  Erroll Luna, MD   REFERRING PHYSICIAN: Erroll Luna, MD  DIAGNOSIS:    ICD-10-CM   1. Malignant neoplasm of central portion of right breast in female, estrogen receptor positive ([place])  C50.111    Z17.0     Cancer Staging Malignant neoplasm of central portion of right breast in female, estrogen receptor positive ([place]) Staging form: Breast, AJCC 8th Edition - Clinical stage from 07/17/2020: Stage IIA (cT2, cN1, cM0, G2, ER+, PR+, HER2-) - Signed by Nicholas Lose, MD on 07/17/2020  CHIEF COMPLAINT: Here to discuss management of right breast cancer  HISTORY OF PRESENT ILLNESS:Shelby [name] is a 50 y.o. female who presented with right breast abnormality on the following imaging: bilateral screening mammogram on the date of 07/03/2020. No symptoms were reported at that time but she recalls her nipple was more inverted on right side than usual. Ultrasound of the right breast on 07/04/2020 revealed a suspicious 2.4 cm mass in the retroareolar right breast with 2 suspicious morphologically abnormal lymph nodes in the right axilla. Biopsies on the date of 07/04/2020 showed invasive mammary carcinoma in the retroareolar mass and right axillary lymph node. E-cadherin was negative, consistent with a lobular phenotype. ER status: 95% moderate; PR status: >95 strong; Her2 status: negative; Grade: 1-2.  She is in her USOH. 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 She is due for her COVID booster.  PREVIOUS RADIATION THERAPY: No  PAST MEDICAL HISTORY:  has a past medical history of Cancer ([place]) and Heart palpitations.    PAST SURGICAL HISTORY: Past  Surgical History:  Procedure Laterality Date  . CHOLECYSTECTOMY    . HERNIA REPAIR    . lumpectomy right breast      FAMILY HISTORY: family history is not on file. She was adopted.  SOCIAL HISTORY:  reports that she has never smoked. She has never used smokeless tobacco. She reports that she does not drink alcohol and does not use drugs.  ALLERGIES: Codeine  MEDICATIONS:  Current Outpatient Medications  Medication Sig Dispense Refill  . buPROPion (WELLBUTRIN XL) 150 MG 24 hr tablet Take 1 tablet (150 mg total) by mouth daily. 60 tablet 0  . cephALEXin (KEFLEX) 500 MG capsule Take 1 capsule (500 mg total) by mouth 2 (two) times daily. 14 capsule 0  . citalopram (CELEXA) 20 MG tablet Take 1 tablet (20 mg total) by mouth daily. (Needs to be seen before next refill) 30 tablet 11  . loratadine (CLARITIN) 10 MG tablet Take 1 tablet (10 mg total) by mouth daily. 30 tablet 2  . metFORMIN (GLUCOPHAGE) 500 MG tablet Take 1 tablet (500 mg total) by mouth 2 (two) times daily with a meal. 90 tablet 0  . metoprolol tartrate (LOPRESSOR) 50 MG tablet Take 1 tablet (50 mg total) by mouth 2 (two) times daily. 60 tablet 6  . mupirocin cream (BACTROBAN) 2 % Apply 1 application topically 2 (two) times daily. 15 g 0  . mupirocin ointment (BACTROBAN) 2 % Apply 1 application topically 2 (two) times daily.    Marland Kitchen nystatin (MYCOSTATIN/NYSTOP) powder Apply 1 application topically 3 (three) times daily. 15 g 0  . pantoprazole (  PROTONIX) 40 MG tablet Take 1 tablet (40 mg total) by mouth daily. (Needs to be seen before next refill) 30 tablet 0  . tolterodine (DETROL LA) 2 MG 24 hr capsule Take 1 capsule (2 mg total) by mouth daily. 60 capsule 1  . ALPRAZolam (XANAX) 0.5 MG tablet Take 1 tablet (0.5 mg total) by mouth at bedtime as needed for anxiety. (Patient not taking: No sig reported) 40 tablet 0  . cetirizine (ZYRTEC) 10 MG tablet Take 1 tablet (10 mg total) by mouth daily. (Patient not taking: Reported on  07/19/2020) 30 tablet 7  . cyclobenzaprine (FLEXERIL) 10 MG tablet Take 1 tablet (10 mg total) by mouth 3 (three) times daily as needed for muscle spasms. (Patient not taking: No sig reported) 30 tablet 0  . fluticasone (FLONASE) 50 MCG/ACT nasal spray Place 2 sprays into both nostrils daily. (Patient not taking: No sig reported) 16 g 11   No current facility-administered medications for this encounter.    REVIEW OF SYSTEMS: As above    PHYSICAL EXAM:  vitals were not taken for this visit.   General: Alert and oriented, in no acute distress   LABORATORY DATA:  Lab Results  Component Value Date   WBC 10.1 07/11/2020   HGB 16.0 (H) 07/11/2020   HCT 47.0 (H) 07/11/2020   MCV 87 07/11/2020   PLT 334 07/11/2020   CMP     Component Value Date/Time   NA 145 (H) 07/11/2020 1232   K 4.5 07/11/2020 1232   CL 103 07/11/2020 1232   CO2 20 07/11/2020 1232   GLUCOSE 113 (H) 07/11/2020 1232   BUN 11 07/11/2020 1232   CREATININE 0.75 07/11/2020 1232   CALCIUM 9.1 07/11/2020 1232   PROT 7.4 07/11/2020 1232   ALBUMIN 4.6 07/11/2020 1232   AST 26 07/11/2020 1232   ALT 42 (H) 07/11/2020 1232   ALKPHOS 112 07/11/2020 1232   BILITOT 0.4 07/11/2020 1232   GFRNONAA 93 07/11/2020 1232   GFRAA 107 07/11/2020 1232         RADIOGRAPHY: US BREAST LTD UNI RIGHT INC AXILLA  Result Date: 07/04/2020 CLINICAL DATA:  Screening recall for right breast mass and abnormal right axillary lymph nodes. EXAM: DIGITAL DIAGNOSTIC UNILATERAL RIGHT MAMMOGRAM WITH TOMO AND CAD; ULTRASOUND RIGHT BREAST LIMITED COMPARISON:  Previous exams. ACR Breast Density Category b: There are scattered areas of fibroglandular density. FINDINGS: Spot compression tomograms were performed of the right breast. There is an irregular mass in the retroareolar right breast with associated nipple retraction, with the mass measuring approximately 2.4 cm. The abnormal lymph nodes seen on the initial screening mammogram could not be included in  the field of view on the spot compression MLO tomograms. Mammographic images were processed with CAD. Physical examination reveals mild retraction/flattening of the right nipple with associated palpable retroareolar thickening. Targeted ultrasound of the right breast was performed. There is an irregular somewhat ill-defined mixed echogenicity predominantly hypoechoic mass in the retroareolar right breast measuring 2.4 x 1.7 x 1.3 cm. There are 2 morphologically abnormal lymph nodes in the right axilla with the more superficial lymph node measuring up to 1.9 cm, with a cortex thickness of 1.1 cm. IMPRESSION: 1. Suspicious 2.4 cm mass in the retroareolar right breast. 2. Suspicious morphologically abnormal lymph nodes in the right axilla. RECOMMENDATION: 1. Recommend ultrasound-guided biopsy of the mass in the retroareolar right breast. 2. Recommend ultrasound guided biopsy of 1 of the abnormal lymph nodes in the right axilla. I have discussed  the findings and recommendations with the patient. If applicable, a reminder letter will be sent to the patient regarding the next appointment. BI-RADS CATEGORY  4: Suspicious. Electronically Signed   By: Everlean Alstrom M.D.   On: 07/04/2020 09:52   MM DIAG BREAST TOMO UNI RIGHT  Result Date: 07/04/2020 CLINICAL DATA:  Screening recall for right breast mass and abnormal right axillary lymph nodes. EXAM: DIGITAL DIAGNOSTIC UNILATERAL RIGHT MAMMOGRAM WITH TOMO AND CAD; ULTRASOUND RIGHT BREAST LIMITED COMPARISON:  Previous exams. ACR Breast Density Category b: There are scattered areas of fibroglandular density. FINDINGS: Spot compression tomograms were performed of the right breast. There is an irregular mass in the retroareolar right breast with associated nipple retraction, with the mass measuring approximately 2.4 cm. The abnormal lymph nodes seen on the initial screening mammogram could not be included in the field of view on the spot compression MLO tomograms.  Mammographic images were processed with CAD. Physical examination reveals mild retraction/flattening of the right nipple with associated palpable retroareolar thickening. Targeted ultrasound of the right breast was performed. There is an irregular somewhat ill-defined mixed echogenicity predominantly hypoechoic mass in the retroareolar right breast measuring 2.4 x 1.7 x 1.3 cm. There are 2 morphologically abnormal lymph nodes in the right axilla with the more superficial lymph node measuring up to 1.9 cm, with a cortex thickness of 1.1 cm. IMPRESSION: 1. Suspicious 2.4 cm mass in the retroareolar right breast. 2. Suspicious morphologically abnormal lymph nodes in the right axilla. RECOMMENDATION: 1. Recommend ultrasound-guided biopsy of the mass in the retroareolar right breast. 2. Recommend ultrasound guided biopsy of 1 of the abnormal lymph nodes in the right axilla. I have discussed the findings and recommendations with the patient. If applicable, a reminder letter will be sent to the patient regarding the next appointment. BI-RADS CATEGORY  4: Suspicious. Electronically Signed   By: Everlean Alstrom M.D.   On: 07/04/2020 09:52   MM 3D SCREEN BREAST BILATERAL  Result Date: 07/03/2020 CLINICAL DATA:  Screening. EXAM: DIGITAL SCREENING BILATERAL MAMMOGRAM WITH TOMO AND CAD COMPARISON:  Previous exam(s). ACR Breast Density Category b: There are scattered areas of fibroglandular density. FINDINGS: In the right breast, a possible mass and axillary adenopathy warrants further evaluation. In the left breast, no findings suspicious for malignancy. Images were processed with CAD. IMPRESSION: Further evaluation is suggested for a possible mass and axillary adenopathy in the right breast. RECOMMENDATION: Diagnostic mammogram and possibly ultrasound of the right breast. (Code:[id number]) The patient will be contacted regarding the findings, and additional imaging will be scheduled. BI-RADS CATEGORY  0: Incomplete. Need  additional imaging evaluation and/or prior mammograms for comparison. Electronically Signed   By: Lillia Mountain M.D.   On: 07/03/2020 13:02   MM CLIP PLACEMENT RIGHT  Result Date: 07/04/2020 CLINICAL DATA:  Post ultrasound-guided biopsy of a mass in the retroareolar right breast and a lymph node in the right axilla with cortical thickening. EXAM: DIAGNOSTIC RIGHT MAMMOGRAM POST ULTRASOUND BIOPSY COMPARISON:  Previous exams. FINDINGS: Mammographic images were obtained following ultrasound-guided biopsy of a mass in the retroareolar right breast and a lymph node in the right axilla with cortical thickening. A wing shaped biopsy marking clip is present at the site of the biopsied mass in the retroareolar right breast. A HydroMARK biopsy marking clip is present at the site of the biopsied lymph node in the right axilla. IMPRESSION: 1. Wing shaped biopsy marking clip at site of biopsied mass in the retroareolar right breast. 2. HydroMARK biopsy marking  clip at site of biopsied lymph node in the right axilla. Final Assessment: Post Procedure Mammograms for Marker Placement Electronically Signed   By: Everlean Alstrom M.D.   On: 07/04/2020 13:24   [place] RT BREAST BX W LOC DEV 1ST LESION IMG BX SPEC US GUIDE  Addendum Date: 07/11/2020   ADDENDUM REPORT: 07/06/2020 13:43 ADDENDUM: PATHOLOGY revealed: Site A. BREAST, RETROAREOLAR, RIGHT, BIOPSY: - Invasive mammary carcinoma, COMMENT: A. The carcinoma appears grade 1-2 and measures 9 mm in greatest linear extent. E-cadherin will be ordered. Pathology results are CONCORDANT with imaging findings, per Dr. Everlean Alstrom. PATHOLOGY revealed: Site B. LYMPH NODE, AXILLA, RIGHT, BIOPSY: - Metastatic carcinoma in a lymph node. Pathology results are CONCORDANT with imaging findings, per Dr. Everlean Alstrom. Pathology results and recommendations below were discussed with patient by telephone on 07/05/2020. Patient reported biopsy site within normal limits with slight tenderness at the  site. Post biopsy care instructions were reviewed, questions were answered and my direct phone number was provided to patient. Patient was instructed to call [facility] Mammography Department if any concerns or questions arise related to the biopsy. Recommend surgical consultation: Request for surgical consultation was relayed to Kathi Der RT at [facility] Mammography Department by Electa Sniff RN on 07/05/2020. Pathology results reported by Electa Sniff RN on 07/06/2020. Electronically Signed   By: Everlean Alstrom M.D.   On: 07/06/2020 13:43   Result Date: 07/11/2020 CLINICAL DATA:  50 year old female with a suspicious mass in the retroareolar right breast and suspicious lymph nodes in the right axilla. EXAM: ULTRASOUND GUIDED RIGHT BREAST CORE NEEDLE BIOPSY COMPARISON:  Previous exam(s). PROCEDURE: I met with the patient and we discussed the procedure of ultrasound-guided biopsy, including benefits and alternatives. We discussed the high likelihood of a successful procedure. We discussed the risks of the procedure, including infection, bleeding, tissue injury, clip migration, and inadequate sampling. Informed written consent was given. The usual time-out protocol was performed immediately prior to the procedure. SITE 1: RIGHT BREAST RETROAREOLAR MASS: Lesion quadrant: LOWER INNER Using sterile technique and 1% Lidocaine as local anesthetic, under direct ultrasound visualization, a 14 gauge spring-loaded device was used to perform biopsy of the mass in the retroareolar right breast using a lateral to medial approach. At the conclusion of the procedure a wing shaped tissue marker clip was deployed into the biopsy cavity. Follow up 2 view mammogram was performed and dictated separately. SITE 2: RIGHT AXILLA LYMPH NODE: Lesion quadrant: UPPER-OUTER Using sterile technique and 1% Lidocaine as local anesthetic, under direct ultrasound visualization, a 14 gauge spring-loaded device was  used to perform biopsy of the lymph node with cortical thickening in the right axilla using a inferolateral to superomedial approach. At the conclusion of the procedure a HydroMARK tissue marker clip was deployed into the biopsy cavity. Follow up 2 view mammogram was performed and dictated separately. IMPRESSION: 1. Ultrasound-guided biopsy of the mass in the retroareolar right breast. 2. Ultrasound-guided biopsy of a lymph node with cortical thickening in the right axilla. Electronically Signed: By: Everlean Alstrom M.D. On: 07/04/2020 13:16   [place] RT BREAST BX W LOC DEV EA ADD LESION IMG BX SPEC US GUIDE  Addendum Date: 07/11/2020   ADDENDUM REPORT: 07/06/2020 13:43 ADDENDUM: PATHOLOGY revealed: Site A. BREAST, RETROAREOLAR, RIGHT, BIOPSY: - Invasive mammary carcinoma, COMMENT: A. The carcinoma appears grade 1-2 and measures 9 mm in greatest linear extent. E-cadherin will be ordered. Pathology results are CONCORDANT with imaging findings, per Dr.  Everlean Alstrom. PATHOLOGY revealed: Site B. LYMPH NODE, AXILLA, RIGHT, BIOPSY: - Metastatic carcinoma in a lymph node. Pathology results are CONCORDANT with imaging findings, per Dr. Everlean Alstrom. Pathology results and recommendations below were discussed with patient by telephone on 07/05/2020. Patient reported biopsy site within normal limits with slight tenderness at the site. Post biopsy care instructions were reviewed, questions were answered and my direct phone number was provided to patient. Patient was instructed to call [facility] Mammography Department if any concerns or questions arise related to the biopsy. Recommend surgical consultation: Request for surgical consultation was relayed to Kathi Der RT at [facility] Mammography Department by Electa Sniff RN on 07/05/2020. Pathology results reported by Electa Sniff RN on 07/06/2020. Electronically Signed   By: Everlean Alstrom M.D.   On: 07/06/2020 13:43   Result Date:  07/11/2020 CLINICAL DATA:  50 year old female with a suspicious mass in the retroareolar right breast and suspicious lymph nodes in the right axilla. EXAM: ULTRASOUND GUIDED RIGHT BREAST CORE NEEDLE BIOPSY COMPARISON:  Previous exam(s). PROCEDURE: I met with the patient and we discussed the procedure of ultrasound-guided biopsy, including benefits and alternatives. We discussed the high likelihood of a successful procedure. We discussed the risks of the procedure, including infection, bleeding, tissue injury, clip migration, and inadequate sampling. Informed written consent was given. The usual time-out protocol was performed immediately prior to the procedure. SITE 1: RIGHT BREAST RETROAREOLAR MASS: Lesion quadrant: LOWER INNER Using sterile technique and 1% Lidocaine as local anesthetic, under direct ultrasound visualization, a 14 gauge spring-loaded device was used to perform biopsy of the mass in the retroareolar right breast using a lateral to medial approach. At the conclusion of the procedure a wing shaped tissue marker clip was deployed into the biopsy cavity. Follow up 2 view mammogram was performed and dictated separately. SITE 2: RIGHT AXILLA LYMPH NODE: Lesion quadrant: UPPER-OUTER Using sterile technique and 1% Lidocaine as local anesthetic, under direct ultrasound visualization, a 14 gauge spring-loaded device was used to perform biopsy of the lymph node with cortical thickening in the right axilla using a inferolateral to superomedial approach. At the conclusion of the procedure a HydroMARK tissue marker clip was deployed into the biopsy cavity. Follow up 2 view mammogram was performed and dictated separately. IMPRESSION: 1. Ultrasound-guided biopsy of the mass in the retroareolar right breast. 2. Ultrasound-guided biopsy of a lymph node with cortical thickening in the right axilla. Electronically Signed: By: Everlean Alstrom M.D. On: 07/04/2020 13:16      IMPRESSION/PLAN: Right breast cancer  It  was a pleasure meeting the patient today. We discussed the risks, benefits, and side effects of radiotherapy. I recommend radiotherapy to the right breast and regional nodes to reduce her risk of locoregional recurrence by 2/3.  We discussed that radiation would take approximately 6 weeks to complete and that I would give the patient a few weeks to heal following surgery before starting treatment planning.  If chemotherapy were to be given, this would precede radiotherapy. We spoke about acute effects including skin irritation and fatigue as well as much less common late effects including internal organ injury or irritation. We spoke about the latest technology that is used to minimize the risk of late effects for patients undergoing radiotherapy to the breast or chest wall. No guarantees of treatment were given. The patient is enthusiastic about proceeding with treatment. I look forward to participating in the patient's care.  I will await her referral back  to me for postoperative follow-up and eventual CT simulation/treatment planning.  We discussed measures to reduce the risk of infection during the COVID-19 pandemic.  She is due for her booster shot and would like to proceed with this per my recommendation. We will make an appt for her to get this tomorrow at the [facility].  This encounter was provided by telemedicine platform; patient desired telemedicine during pandemic precautions;  Mychart video was used. The patient has given verbal consent for this type of encounter and has been advised to only accept a meeting of this type in a secure network environment. On date of service, in total, I spent 40 minutes on this encounter.   The attendants for this meeting include Eppie Gibson  and [name] During the encounter, Eppie Gibson was located at [facility] Radiation Oncology Department.  Emmaline Kluver Klang was located at home.    __________________________________________   Eppie Gibson,  MD  This document serves as a record of services personally performed by Eppie Gibson, MD. It was created on his behalf by Clerance Lav, a trained medical scribe. The creation of this record is based on the scribe's personal observations and the provider's statements to them. This document has been checked and approved by the attending provider.

## 2020-07-20 ENCOUNTER — Inpatient Hospital Stay: Payer: Self-pay

## 2020-07-20 ENCOUNTER — Other Ambulatory Visit: Payer: Self-pay

## 2020-07-20 DIAGNOSIS — Z23 Encounter for immunization: Secondary | ICD-10-CM

## 2020-07-20 NOTE — Progress Notes (Signed)
   Covid-19 Vaccination Clinic  Name:  Shelby [name]    MRN: [id number] DOB: [birth year]  07/20/2020  [name] was observed post Covid-19 immunization for 15 minutes without incident. She was provided with Vaccine Information Sheet and instruction to access the V-Safe system.   [name] was instructed to call 911 with any severe reactions post vaccine: Marland Kitchen Difficulty breathing  . Swelling of face and throat  . A fast heartbeat  . A bad rash all over body  . Dizziness and weakness   Immunizations Administered    Name Date Dose VIS Date Route   PFIZER Comrnaty(Gray TOP) Covid-19 Vaccine 07/20/2020 10:45 AM 0.3 mL 06/08/2020 Intramuscular   Manufacturer: [place]   Lot: [id number]   NDC: [id number]

## 2020-07-21 ENCOUNTER — Encounter: Payer: Self-pay | Admitting: Radiation Oncology

## 2020-07-24 ENCOUNTER — Telehealth: Payer: Self-pay | Admitting: Hematology and Oncology

## 2020-07-24 NOTE — Telephone Encounter (Signed)
Called to inform patient of her upcoming appointment. Patient is aware. 

## 2020-07-25 ENCOUNTER — Encounter: Payer: Self-pay | Admitting: Licensed Clinical Social Worker

## 2020-07-25 ENCOUNTER — Ambulatory Visit: Payer: Self-pay | Admitting: Nurse Practitioner

## 2020-07-25 NOTE — Progress Notes (Signed)
[place] Work  Initial Assessment   Shelby [name] is a 50 y.o. year old female contacted by phone. Clinical Social Work was referred by new patient protocol for assessment of psychosocial needs.   SDOH (Social Determinants of Health) assessments performed: No   Distress Screen completed: No No flowsheet data found.   Family/Social Information:  . Housing Arrangement: patient lives with husband . Family members/support persons in your life? Family- husband, two adult children, 1yo granddaughter. Friends . Transportation concerns: no  . Employment: self-employed Copywriter, advertising. Income source: husband's employment . Financial concerns: No o Type of concern: None . Food access concerns: no . Services Currently in place:  n/a  Coping/ Adjustment to diagnosis: . Patient understands treatment plan and what happens next? yes . Concerns about diagnosis and/or treatment: general adjustment to diagnosis . Patient reported stressors: Adjusting to my illness . Hopes and priorities: hopes to get through treatment and be able to continue towards goals of overall healthier weight and lifestyle . Patient enjoys reading and time with family/ friends . Current coping skills/ strengths: Ability for insight, Capable of independent living, Motivation for treatment/growth and Supportive family/friends    SUMMARY: Current SDOH Barriers:  . No significant SDOH barriers at this time  Clinical Social Work Clinical Goal(s):  Marland Kitchen No clinical social work goals at this time  Interventions: . Discussed common feeling and emotions when being diagnosed with cancer, and the importance of support during treatment . Informed patient of the support team roles and support services at [facility] . Provided CSW contact information and encouraged patient to call with any questions or concerns   Follow Up Plan: Patient will contact CSW with any support or resource needs Patient verbalizes understanding of plan:  Yes    Christeen Douglas , LCSW

## 2020-07-26 ENCOUNTER — Other Ambulatory Visit: Payer: Self-pay

## 2020-07-26 ENCOUNTER — Ambulatory Visit
Admission: RE | Admit: 2020-07-26 | Discharge: 2020-07-26 | Disposition: A | Discharge status: Home / Self Care | Payer: Self-pay | Source: Ambulatory Visit | Attending: Surgery | Admitting: Surgery

## 2020-07-26 DIAGNOSIS — Z170 Estrogen receptor positive status [ER+]: Secondary | ICD-10-CM

## 2020-07-26 DIAGNOSIS — C50919 Malignant neoplasm of unspecified site of unspecified female breast: Secondary | ICD-10-CM

## 2020-07-26 MED ORDER — GADOBUTROL 1 MMOL/ML IV SOLN
10.0000 mL | Freq: Once | INTRAVENOUS | Status: AC | PRN
  Administered 2020-07-26: 10 mL via INTRAVENOUS

## 2020-07-28 ENCOUNTER — Other Ambulatory Visit: Payer: Self-pay | Admitting: Surgery

## 2020-07-28 DIAGNOSIS — R9389 Abnormal findings on diagnostic imaging of other specified body structures: Secondary | ICD-10-CM

## 2020-07-31 ENCOUNTER — Other Ambulatory Visit: Payer: Self-pay | Admitting: Surgery

## 2020-07-31 ENCOUNTER — Ambulatory Visit
Admission: RE | Admit: 2020-07-31 | Discharge: 2020-07-31 | Disposition: A | Discharge status: Home / Self Care | Payer: Self-pay | Source: Ambulatory Visit | Attending: Surgery | Admitting: Surgery

## 2020-07-31 ENCOUNTER — Encounter: Payer: Self-pay | Admitting: *Deleted

## 2020-07-31 ENCOUNTER — Other Ambulatory Visit: Payer: Self-pay

## 2020-07-31 ENCOUNTER — Ambulatory Visit: Payer: Self-pay

## 2020-07-31 DIAGNOSIS — R9389 Abnormal findings on diagnostic imaging of other specified body structures: Secondary | ICD-10-CM

## 2020-08-01 ENCOUNTER — Ambulatory Visit: Payer: Self-pay | Admitting: Nurse Practitioner

## 2020-08-02 NOTE — Progress Notes (Signed)
[facility] [place], [address] [place] [address] Phone: [phone] Fax: [phone]      Your procedure is scheduled on February 9  Report to [facility] "A" at [place]M., and check in at the Admitting office.  Call this number if you have problems the morning of surgery:  [phone]  Call [phone] if you have any questions prior to your surgery date Monday-Friday 8am-4pm    Remember:  Do not eat after midnight the night before your surgery  You may drink clear liquids until 0845 am the morning of your surgery.   Clear liquids allowed are: Water, Non-Citrus Juices (without pulp), Carbonated Beverages, Clear Tea, Black Coffee Only, and Gatorade    Take these medicines the morning of surgery with A SIP OF WATER  ALPRAZolam (XANAX)  If needed cetirizine (ZYRTEC) loratadine (CLARITIN) metoprolol tartrate (LOPRESSOR) pantoprazole (PROTONIX)  tolterodine (DETROL LA)  As of today, STOP taking any Aspirin (unless otherwise instructed by your surgeon) Aleve, Naproxen, Ibuprofen, Motrin, Advil, Goody's, BC's, all herbal medications, fish oil, and all vitamins.   WHAT DO I DO ABOUT MY DIABETES MEDICATION?   Marland Kitchen Do not take oral diabetes medicines (pills) the morning of surgery. metFORMIN (GLUCOPHAGE)  HOW TO MANAGE YOUR DIABETES BEFORE AND AFTER SURGERY  Why is it important to control my blood sugar before and after surgery? . Improving blood sugar levels before and after surgery helps healing and can limit problems. . A way of improving blood sugar control is eating a healthy diet by: o  Eating less sugar and carbohydrates o  Increasing activity/exercise o  Talking with your doctor about reaching your blood sugar goals . High blood sugars (greater than 180 mg/dL) can raise your risk of infections and slow your recovery, so you will need to focus on controlling your diabetes during the weeks before surgery. . Make sure  that the doctor who takes care of your diabetes knows about your planned surgery including the date and location.  How do I manage my blood sugar before surgery? . Check your blood sugar at least 4 times a day, starting 2 days before surgery, to make sure that the level is not too high or low. . Check your blood sugar the morning of your surgery when you wake up and every 2 hours until you get to the Short Stay unit. o If your blood sugar is less than 70 mg/dL, you will need to treat for low blood sugar: - Do not take insulin. - Treat a low blood sugar (less than 70 mg/dL) with  cup of clear juice (cranberry or apple), 4 glucose tablets, OR glucose gel. - Recheck blood sugar in 15 minutes after treatment (to make sure it is greater than 70 mg/dL). If your blood sugar is not greater than 70 mg/dL on recheck, call [phone] for further instructions. . Report your blood sugar to the short stay nurse when you get to Short Stay.  . If you are admitted to the hospital after surgery: o Your blood sugar will be checked by the staff and you will probably be given insulin after surgery (instead of oral diabetes medicines) to make sure you have good blood sugar levels. o The goal for blood sugar control after surgery is 80-180 mg/dL.                     Do not wear jewelry, make up, or nail polish  Do not wear lotions, powders, perfumes, or deodorant.            Do not shave 48 hours prior to surgery.              Do not bring valuables to the hospital.            [facility] is not responsible for any belongings or valuables.  Do NOT Smoke (Tobacco/Vaping) or drink Alcohol 24 hours prior to your procedure If you use a CPAP at night, you may bring all equipment for your overnight stay.   Contacts, glasses, dentures or bridgework may not be worn into surgery.      For patients admitted to the hospital, discharge time will be determined by your treatment team.   Patients discharged the  day of surgery will not be allowed to drive home, and someone needs to stay with them for 24 hours.    Special instructions:   [facility]- Preparing For Surgery  Before surgery, you can play an important role. Because skin is not sterile, your skin needs to be as free of germs as possible. You can reduce the number of germs on your skin by washing with CHG (chlorahexidine gluconate) Soap before surgery.  CHG is an antiseptic cleaner which kills germs and bonds with the skin to continue killing germs even after washing.    Oral Hygiene is also important to reduce your risk of infection.  Remember - BRUSH YOUR TEETH THE MORNING OF SURGERY WITH YOUR REGULAR TOOTHPASTE  Please do not use if you have an allergy to CHG or antibacterial soaps. If your skin becomes reddened/irritated stop using the CHG.  Do not shave (including legs and underarms) for at least 48 hours prior to first CHG shower. It is OK to shave your face.  Please follow these instructions carefully.   1. Shower the NIGHT BEFORE SURGERY and the MORNING OF SURGERY with CHG Soap.   2. If you chose to wash your hair, wash your hair first as usual with your normal shampoo.  3. After you shampoo, rinse your hair and body thoroughly to remove the shampoo.  4. Use CHG as you would any other liquid soap. You can apply CHG directly to the skin and wash gently with a scrungie or a clean washcloth.   5. Apply the CHG Soap to your body ONLY FROM THE NECK DOWN.  Do not use on open wounds or open sores. Avoid contact with your eyes, ears, mouth and genitals (private parts). Wash Face and genitals (private parts)  with your normal soap.   6. Wash thoroughly, paying special attention to the area where your surgery will be performed.  7. Thoroughly rinse your body with warm water from the neck down.  8. DO NOT shower/wash with your normal soap after using and rinsing off the CHG Soap.  9. Pat yourself dry with a CLEAN TOWEL.  10. Wear  CLEAN PAJAMAS to bed the night before surgery  11. Place CLEAN SHEETS on your bed the night of your first shower and DO NOT SLEEP WITH PETS.   Day of Surgery: Wear Clean/Comfortable clothing the morning of surgery Do not apply any deodorants/lotions.   Remember to brush your teeth WITH YOUR REGULAR TOOTHPASTE.   Please read over the following fact sheets that you were given.

## 2020-08-03 ENCOUNTER — Encounter (HOSPITAL_COMMUNITY): Payer: Self-pay

## 2020-08-03 ENCOUNTER — Encounter (HOSPITAL_COMMUNITY)
Admission: RE | Admit: 2020-08-03 | Discharge: 2020-08-03 | Disposition: A | Discharge status: Home / Self Care | Payer: BC Managed Care – PPO | Source: Ambulatory Visit | Attending: Surgery | Admitting: Surgery

## 2020-08-03 ENCOUNTER — Other Ambulatory Visit: Payer: Self-pay

## 2020-08-03 DIAGNOSIS — C50911 Malignant neoplasm of unspecified site of right female breast: Secondary | ICD-10-CM | POA: Diagnosis not present

## 2020-08-03 DIAGNOSIS — I4510 Unspecified right bundle-branch block: Secondary | ICD-10-CM | POA: Diagnosis not present

## 2020-08-03 DIAGNOSIS — Z01818 Encounter for other preprocedural examination: Secondary | ICD-10-CM | POA: Diagnosis not present

## 2020-08-03 HISTORY — DX: Type 2 diabetes mellitus without complications: E11.9

## 2020-08-03 HISTORY — DX: Dyspnea, unspecified: R06.00

## 2020-08-03 HISTORY — DX: Other complications of anesthesia, initial encounter: T88.59XA

## 2020-08-03 HISTORY — DX: Obesity, unspecified: E66.9

## 2020-08-03 LAB — COMPREHENSIVE METABOLIC PANEL
ALT: 41 U/L (ref 0–44)
AST: 23 U/L (ref 15–41)
Albumin: 3.7 g/dL (ref 3.5–5.0)
Alkaline Phosphatase: 77 U/L (ref 38–126)
Anion gap: 11 (ref 5–15)
BUN: 12 mg/dL (ref 6–20)
CO2: 24 mmol/L (ref 22–32)
Calcium: 9.1 mg/dL (ref 8.9–10.3)
Chloride: 103 mmol/L (ref 98–111)
Creatinine, Ser: 0.77 mg/dL (ref 0.44–1.00)
GFR, Estimated: >60 mL/min (ref >60)
Glucose, Bld: 115 mg/dL — ABNORMAL HIGH (ref 70–99)
Potassium: 3.9 mmol/L (ref 3.5–5.1)
Sodium: 138 mmol/L (ref 135–145)
Total Bilirubin: 0.8 mg/dL (ref 0.3–1.2)
Total Protein: 7.1 g/dL (ref 6.5–8.1)

## 2020-08-03 LAB — CBC WITH DIFFERENTIAL/PLATELET
Abs Immature Granulocytes: 0.04 10*3/uL (ref 0.00–0.07)
Basophils Absolute: 0 10*3/uL (ref 0.0–0.1)
Basophils Relative: 0 %
Eosinophils Absolute: 0.1 10*3/uL (ref 0.0–0.5)
Eosinophils Relative: 1 %
HCT: 45.1 % (ref 36.0–46.0)
Hemoglobin: 15.4 g/dL — ABNORMAL HIGH (ref 12.0–15.0)
Immature Granulocytes: 1 %
Lymphocytes Relative: 25 %
Lymphs Abs: 2.2 10*3/uL (ref 0.7–4.0)
MCH: 30.8 pg (ref 26.0–34.0)
MCHC: 34.1 g/dL (ref 30.0–36.0)
MCV: 90.2 fL (ref 80.0–100.0)
Monocytes Absolute: 0.4 10*3/uL (ref 0.1–1.0)
Monocytes Relative: 5 %
Neutro Abs: 5.9 10*3/uL (ref 1.7–7.7)
Neutrophils Relative %: 68 %
Platelets: 285 10*3/uL (ref 150–400)
RBC: 5 MIL/uL (ref 3.87–5.11)
RDW: 13.3 % (ref 11.5–15.5)
WBC: 8.7 10*3/uL (ref 4.0–10.5)
nRBC: 0 % (ref 0.0–0.2)

## 2020-08-03 LAB — HEMOGLOBIN A1C
Hgb A1c MFr Bld: 5.7 % — ABNORMAL HIGH (ref 4.8–5.6)
Mean Plasma Glucose: 116.89 mg/dL

## 2020-08-03 LAB — GLUCOSE, CAPILLARY: Glucose-Capillary: 114 mg/dL — ABNORMAL HIGH (ref 70–99)

## 2020-08-03 MED ORDER — CHLORHEXIDINE GLUCONATE CLOTH 2 % EX PADS: 6.0000 | MEDICATED_PAD | Freq: Once | CUTANEOUS | Status: DC

## 2020-08-03 NOTE — Progress Notes (Signed)
PCP - Kary Kos FM Cardiologist - NA   -   -  hest x-ray - NA EKG - 2/3/2 Stress Test - NA ECHO - NA Cardiac Cath -NA     Fasting Blood Sugar - ? Checks Blood Sugar _O____ times a day  Blood Thinner Instructions:NA Aspirin Instructions:STOP  ERAS Protcol -INSTTRUCTIONS  GIVEN    COVID TEST- FOR 08/06/20   Anesthesia review: HTN  Patient denies shortness of breath, fever, cough and chest pain at PAT appointment   All instructions explained to the patient, with a verbal understanding of the material. Patient agrees to go over the instructions while at home for a better understanding. Patient also instructed to self quarantine after being tested for COVID-19. The opportunity to ask questions was provided.

## 2020-08-04 ENCOUNTER — Ambulatory Visit (INDEPENDENT_AMBULATORY_CARE_PROVIDER_SITE_OTHER): Payer: BC Managed Care – PPO | Admitting: Family

## 2020-08-04 ENCOUNTER — Ambulatory Visit
Admission: RE | Admit: 2020-08-04 | Discharge: 2020-08-04 | Disposition: A | Discharge status: Home / Self Care | Payer: BC Managed Care – PPO | Source: Ambulatory Visit | Attending: Surgery | Admitting: Surgery

## 2020-08-04 ENCOUNTER — Ambulatory Visit: Payer: Self-pay | Admitting: Nurse Practitioner

## 2020-08-04 ENCOUNTER — Encounter: Payer: Self-pay | Admitting: Family

## 2020-08-04 ENCOUNTER — Other Ambulatory Visit: Payer: Self-pay | Admitting: Body Imaging

## 2020-08-04 DIAGNOSIS — R399 Unspecified symptoms and signs involving the genitourinary system: Secondary | ICD-10-CM

## 2020-08-04 DIAGNOSIS — R9389 Abnormal findings on diagnostic imaging of other specified body structures: Secondary | ICD-10-CM

## 2020-08-04 DIAGNOSIS — R928 Other abnormal and inconclusive findings on diagnostic imaging of breast: Secondary | ICD-10-CM | POA: Diagnosis not present

## 2020-08-04 DIAGNOSIS — D241 Benign neoplasm of right breast: Secondary | ICD-10-CM | POA: Diagnosis not present

## 2020-08-04 MED ORDER — FLUCONAZOLE 150 MG PO TABS: 150.0000 mg | ORAL_TABLET | ORAL | 0 refills | Status: DC | PRN

## 2020-08-04 MED ORDER — CEPHALEXIN 500 MG PO CAPS: 500.0000 mg | ORAL_CAPSULE | Freq: Two times a day (BID) | ORAL | 0 refills | Status: DC

## 2020-08-04 NOTE — Progress Notes (Signed)
   Virtual Visit via telephone Note Due to COVID-19 pandemic this visit was conducted virtually. This visit type was conducted due to national recommendations for restrictions regarding the COVID-19 Pandemic (e.g. social distancing, sheltering in place) in an effort to limit this patient's exposure and mitigate transmission in our community. All issues noted in this document were discussed and addressed.  A physical exam was not performed with this format.  I connected with Shelby [name] on 08/04/20 at 4:21 pm  by telephone and verified that I am speaking with the correct person using two identifiers. Shelby [name] is currently located at home and no one is currently with her during visit. The provider, Evelina Dun, FNP is located in their office at time of visit.  I discussed the limitations, risks, security and privacy concerns of performing an evaluation and management service by telephone and the availability of in person appointments. I also discussed with the patient that there may be a patient responsible charge related to this service. The patient expressed understanding and agreed to proceed.   History and Present Illness:  Urinary Frequency  This is a new problem. The current episode started in the past 7 days. The problem occurs intermittently. The problem has been waxing and waning. The quality of the pain is described as aching. The pain is at a severity of 3/10. The pain is mild. There has been no fever. Associated symptoms include frequency, hesitancy and urgency. Pertinent negatives include no flank pain, hematuria, nausea or vomiting. She has tried increased fluids for the symptoms. The treatment provided mild relief.      Review of Systems  Gastrointestinal: Negative for nausea and vomiting.  Genitourinary: Positive for frequency, hesitancy and urgency. Negative for flank pain and hematuria.     Observations/Objective: No SOB or distress noted   Assessment and Plan: 1.  UTI symptoms Force fluids AZO over the counter X2 days RTO if symptoms worse or do not improve  - cephALEXin (KEFLEX) 500 MG capsule; Take 1 capsule (500 mg total) by mouth 2 (two) times daily.  Dispense: 14 capsule; Refill: 0      I discussed the assessment and treatment plan with the patient. The patient was provided an opportunity to ask questions and all were answered. The patient agreed with the plan and demonstrated an understanding of the instructions.   The patient was advised to call back or seek an in-person evaluation if the symptoms worsen or if the condition fails to improve as anticipated.  The above assessment and management plan was discussed with the patient. The patient verbalized understanding of and has agreed to the management plan. Patient is aware to call the clinic if symptoms persist or worsen. Patient is aware when to return to the clinic for a follow-up visit. Patient educated on when it is appropriate to go to the emergency department.   Time call ended:  4:32 pm   I provided 11 minutes of non-face-to-face time during this encounter.    Evelina Dun, FNP

## 2020-08-04 NOTE — Anesthesia Preprocedure Evaluation (Addendum)
Anesthesia Evaluation  Patient identified by MRN, date of birth, ID band Patient awake    Reviewed: Allergy & Precautions, NPO status , Patient's Chart, lab work & pertinent test results, reviewed documented beta blocker date and time   History of Anesthesia Complications Negative for: history of anesthetic complications  Airway Mallampati: II  TM Distance: >3 FB Neck ROM: Full    Dental  (+) Missing,    Pulmonary neg pulmonary ROS,    Pulmonary exam normal        Cardiovascular hypertension, Pt. on medications and Pt. on home beta blockers Normal cardiovascular exam     Neuro/Psych Anxiety Depression negative neurological ROS     GI/Hepatic Neg liver ROS, GERD  Controlled and Medicated,  Endo/Other  diabetes, Type 2, Oral Hypoglycemic AgentsMorbid obesity (BMI 65)  Renal/GU negative Renal ROS  negative genitourinary   Musculoskeletal negative musculoskeletal ROS (+)   Abdominal   Peds  Hematology negative hematology ROS (+)   Anesthesia Other Findings Right breast ca  Reproductive/Obstetrics negative OB ROS                           Anesthesia Physical Anesthesia Plan  ASA: III  Anesthesia Plan: General   Post-op Pain Management: GA combined w/ Regional for post-op pain   Induction: Intravenous  PONV Risk Score and Plan: 3 and Treatment may vary due to age or medical condition, Ondansetron, Dexamethasone and Midazolam  Airway Management Planned: Oral ETT  Additional Equipment: None  Intra-op Plan:   Post-operative Plan: Extubation in OR  Informed Consent: I have reviewed the patients History and Physical, chart, labs and discussed the procedure including the risks, benefits and alternatives for the proposed anesthesia with the patient or authorized representative who has indicated his/her understanding and acceptance.     Dental advisory given  Plan Discussed with:  CRNA  Anesthesia Plan Comments: (PAT note by Karoline Caldwell, PA-C: History of false positive stress test in 2013 that was ultimately followed up with cardiac cath showing widely patent coronary arteries.  She was last seen by cardiology i11/16/2016 for follow-up of palpitations.  Per Dr. Dion Body note, her palpitations were stable, and he felt her most pressing issue was her weight and she was referred to bariatric weight loss program.  Preop labs reviewed, unremarkable.  DM2 well-controlled with A1c of 5.7.  EKG 08/03/2020: Normal sinus rhythm.  Rate 78. Low voltage QRS. Incomplete right bundle branch block. Cannot rule out Inferior infarct , age undetermined  Cardiac cath 12/17/2011 (Care Everywhere): CORONARY ANGIOGRAPHY:   Ramus Present:    Yes  Coronary Dominance: Left   Lesions/Grafts Comments: Widely patent coronary arteries     COMPLICATIONS:  none    CONCLUSIONS:  CORONARY STATUS: Normal    OTHER: Widely patent normal coronaries. Left dominant.  Normal left main.  )      Anesthesia Quick Evaluation

## 2020-08-04 NOTE — Progress Notes (Signed)
Anesthesia Chart Review:  History of false positive stress test in 2013 that was ultimately followed up with cardiac cath showing widely patent coronary arteries.  She was last seen by cardiology i11/16/2016 for follow-up of palpitations.  Per Dr. Dion Body note, her palpitations were stable, and he felt her most pressing issue was her weight and she was referred to bariatric weight loss program.  Preop labs reviewed, unremarkable.  DM2 well-controlled with A1c of 5.7.  EKG 08/03/2020: Normal sinus rhythm.  Rate 78. Low voltage QRS. Incomplete right bundle branch block. Cannot rule out Inferior infarct , age undetermined  Cardiac cath 12/17/2011 (Care Everywhere): CORONARY ANGIOGRAPHY:   Ramus Present:    Yes  Coronary Dominance: Left   Lesions/Grafts Comments: Widely patent coronary arteries     COMPLICATIONS:  none    CONCLUSIONS:  CORONARY STATUS: Normal    OTHER: Widely patent normal coronaries. Left dominant.  Normal left main.     Wynonia Musty [facility] Short Stay Center/Anesthesiology Phone [phone] 08/04/2020 1:33 PM

## 2020-08-05 ENCOUNTER — Other Ambulatory Visit (HOSPITAL_COMMUNITY)
Admission: RE | Admit: 2020-08-05 | Discharge: 2020-08-05 | Disposition: A | Discharge status: Home / Self Care | Payer: BC Managed Care – PPO | Source: Ambulatory Visit | Attending: Surgery | Admitting: Surgery

## 2020-08-05 DIAGNOSIS — Z01812 Encounter for preprocedural laboratory examination: Secondary | ICD-10-CM | POA: Insufficient documentation

## 2020-08-05 DIAGNOSIS — Z20822 Contact with and (suspected) exposure to covid-19: Secondary | ICD-10-CM | POA: Insufficient documentation

## 2020-08-05 LAB — SARS CORONAVIRUS 2 (TAT 6-24 HRS): SARS Coronavirus 2: NEGATIVE

## 2020-08-08 ENCOUNTER — Encounter: Payer: Self-pay | Admitting: *Deleted

## 2020-08-08 MED ORDER — DEXTROSE 5 % IV SOLN
3.0000 g | INTRAVENOUS | Status: DC
  Filled 2020-08-08: qty 3000

## 2020-08-09 ENCOUNTER — Other Ambulatory Visit: Payer: Self-pay | Admitting: Surgery

## 2020-08-09 ENCOUNTER — Other Ambulatory Visit: Payer: Self-pay

## 2020-08-09 ENCOUNTER — Ambulatory Visit
Admission: RE | Admit: 2020-08-09 | Discharge: 2020-08-09 | Disposition: A | Discharge status: Home / Self Care | Payer: BC Managed Care – PPO | Source: Ambulatory Visit | Attending: Surgery | Admitting: Surgery

## 2020-08-09 ENCOUNTER — Encounter (HOSPITAL_COMMUNITY)
Admission: RE | Admit: 2020-08-09 | Discharge: 2020-08-09 | Disposition: A | Discharge status: Home / Self Care | Payer: BC Managed Care – PPO | Source: Ambulatory Visit | Attending: Surgery | Admitting: Surgery

## 2020-08-09 ENCOUNTER — Ambulatory Visit (HOSPITAL_COMMUNITY): Payer: BC Managed Care – PPO | Admitting: Anesthesiology

## 2020-08-09 ENCOUNTER — Encounter (HOSPITAL_COMMUNITY)
Admission: RE | Disposition: A | Discharge status: Home / Self Care | Payer: Self-pay | Source: Home / Self Care | Attending: Surgery

## 2020-08-09 ENCOUNTER — Encounter (HOSPITAL_COMMUNITY): Payer: Self-pay | Admitting: Surgery

## 2020-08-09 ENCOUNTER — Ambulatory Visit (HOSPITAL_COMMUNITY): Payer: BC Managed Care – PPO | Admitting: Physician Assistant

## 2020-08-09 ENCOUNTER — Ambulatory Visit (HOSPITAL_COMMUNITY)
Admission: RE | Admit: 2020-08-09 | Discharge: 2020-08-09 | Disposition: A | Discharge status: Home / Self Care | Payer: BC Managed Care – PPO | Attending: Surgery | Admitting: Surgery

## 2020-08-09 DIAGNOSIS — C50911 Malignant neoplasm of unspecified site of right female breast: Secondary | ICD-10-CM

## 2020-08-09 DIAGNOSIS — G8918 Other acute postprocedural pain: Secondary | ICD-10-CM | POA: Diagnosis not present

## 2020-08-09 DIAGNOSIS — C50111 Malignant neoplasm of central portion of right female breast: Secondary | ICD-10-CM | POA: Diagnosis not present

## 2020-08-09 DIAGNOSIS — R590 Localized enlarged lymph nodes: Secondary | ICD-10-CM | POA: Diagnosis not present

## 2020-08-09 DIAGNOSIS — C50411 Malignant neoplasm of upper-outer quadrant of right female breast: Secondary | ICD-10-CM | POA: Insufficient documentation

## 2020-08-09 DIAGNOSIS — D241 Benign neoplasm of right breast: Secondary | ICD-10-CM | POA: Diagnosis not present

## 2020-08-09 DIAGNOSIS — Z170 Estrogen receptor positive status [ER+]: Secondary | ICD-10-CM | POA: Insufficient documentation

## 2020-08-09 DIAGNOSIS — C773 Secondary and unspecified malignant neoplasm of axilla and upper limb lymph nodes: Secondary | ICD-10-CM | POA: Insufficient documentation

## 2020-08-09 DIAGNOSIS — N6341 Unspecified lump in right breast, subareolar: Secondary | ICD-10-CM | POA: Diagnosis not present

## 2020-08-09 HISTORY — PX: BREAST LUMPECTOMY WITH RADIOACTIVE SEED AND SENTINEL LYMPH NODE BIOPSY: SHX6550

## 2020-08-09 LAB — GLUCOSE, CAPILLARY
Glucose-Capillary: 120 mg/dL — ABNORMAL HIGH (ref 70–99)
Glucose-Capillary: 150 mg/dL — ABNORMAL HIGH (ref 70–99)

## 2020-08-09 LAB — POCT PREGNANCY, URINE: Preg Test, Ur: NEGATIVE

## 2020-08-09 SURGERY — BREAST LUMPECTOMY WITH RADIOACTIVE SEED AND SENTINEL LYMPH NODE BIOPSY
Anesthesia: General | Site: Breast | Laterality: Right

## 2020-08-09 MED ORDER — MIDAZOLAM HCL 2 MG/2ML IJ SOLN: 1.0000 mg | Freq: Once | INTRAMUSCULAR | Status: AC

## 2020-08-09 MED ORDER — FENTANYL CITRATE (PF) 250 MCG/5ML IJ SOLN
INTRAMUSCULAR | Status: AC
  Filled 2020-08-09: qty 5

## 2020-08-09 MED ORDER — SUCCINYLCHOLINE CHLORIDE 200 MG/10ML IV SOSY
PREFILLED_SYRINGE | INTRAVENOUS | Status: AC
  Filled 2020-08-09: qty 10

## 2020-08-09 MED ORDER — FENTANYL CITRATE (PF) 250 MCG/5ML IJ SOLN
INTRAMUSCULAR | Status: DC | PRN
  Administered 2020-08-09: 100 ug via INTRAVENOUS
  Administered 2020-08-09 (×2): 25 ug via INTRAVENOUS

## 2020-08-09 MED ORDER — CHLORHEXIDINE GLUCONATE 0.12 % MT SOLN
15.0000 mL | Freq: Once | OROMUCOSAL | Status: AC
  Administered 2020-08-09: 15 mL via OROMUCOSAL
  Filled 2020-08-09: qty 15

## 2020-08-09 MED ORDER — IBUPROFEN 800 MG PO TABS: 800.0000 mg | ORAL_TABLET | Freq: Three times a day (TID) | ORAL | 0 refills | Status: DC | PRN

## 2020-08-09 MED ORDER — DEXAMETHASONE SODIUM PHOSPHATE 10 MG/ML IJ SOLN
INTRAMUSCULAR | Status: AC
  Filled 2020-08-09: qty 1

## 2020-08-09 MED ORDER — BUPIVACAINE-EPINEPHRINE (PF) 0.5% -1:200000 IJ SOLN
INTRAMUSCULAR | Status: DC | PRN
  Administered 2020-08-09: 20 mL via PERINEURAL

## 2020-08-09 MED ORDER — SODIUM CHLORIDE (PF) 0.9 % IJ SOLN
INTRAMUSCULAR | Status: AC
  Filled 2020-08-09: qty 10

## 2020-08-09 MED ORDER — ONDANSETRON HCL 4 MG/2ML IJ SOLN: 4.0000 mg | Freq: Once | INTRAMUSCULAR | Status: AC

## 2020-08-09 MED ORDER — ONDANSETRON HCL 4 MG/2ML IJ SOLN
INTRAMUSCULAR | Status: AC
  Administered 2020-08-09: 4 mg via INTRAVENOUS
  Filled 2020-08-09: qty 2

## 2020-08-09 MED ORDER — METHYLENE BLUE 0.5 % INJ SOLN
INTRAVENOUS | Status: AC
  Filled 2020-08-09: qty 10

## 2020-08-09 MED ORDER — BUPIVACAINE HCL (PF) 0.25 % IJ SOLN
INTRAMUSCULAR | Status: AC
  Filled 2020-08-09: qty 30

## 2020-08-09 MED ORDER — MIDAZOLAM HCL 2 MG/2ML IJ SOLN
INTRAMUSCULAR | Status: AC
  Filled 2020-08-09: qty 2

## 2020-08-09 MED ORDER — LIDOCAINE 2% (20 MG/ML) 5 ML SYRINGE
INTRAMUSCULAR | Status: DC | PRN
  Administered 2020-08-09: 100 mg via INTRAVENOUS

## 2020-08-09 MED ORDER — PROPOFOL 10 MG/ML IV BOLUS
INTRAVENOUS | Status: DC | PRN
  Administered 2020-08-09: 200 mg via INTRAVENOUS

## 2020-08-09 MED ORDER — LIDOCAINE 2% (20 MG/ML) 5 ML SYRINGE
INTRAMUSCULAR | Status: AC
  Filled 2020-08-09: qty 5

## 2020-08-09 MED ORDER — LACTATED RINGERS IV SOLN: INTRAVENOUS | Status: DC

## 2020-08-09 MED ORDER — OXYCODONE HCL 5 MG PO TABS: 5.0000 mg | ORAL_TABLET | Freq: Once | ORAL | Status: DC | PRN

## 2020-08-09 MED ORDER — ORAL CARE MOUTH RINSE: 15.0000 mL | Freq: Once | OROMUCOSAL | Status: AC

## 2020-08-09 MED ORDER — FENTANYL CITRATE (PF) 100 MCG/2ML IJ SOLN
INTRAMUSCULAR | Status: AC
  Administered 2020-08-09: 100 ug via INTRAVENOUS
  Filled 2020-08-09: qty 2

## 2020-08-09 MED ORDER — FENTANYL CITRATE (PF) 100 MCG/2ML IJ SOLN: 100.0000 ug | Freq: Once | INTRAMUSCULAR | Status: AC

## 2020-08-09 MED ORDER — 0.9 % SODIUM CHLORIDE (POUR BTL) OPTIME
TOPICAL | Status: DC | PRN
  Administered 2020-08-09: 1000 mL

## 2020-08-09 MED ORDER — HEMOSTATIC AGENTS (NO CHARGE) OPTIME
TOPICAL | Status: DC | PRN
  Administered 2020-08-09: 2 via TOPICAL

## 2020-08-09 MED ORDER — MIDAZOLAM HCL 2 MG/2ML IJ SOLN
INTRAMUSCULAR | Status: DC | PRN
  Administered 2020-08-09: 2 mg via INTRAVENOUS

## 2020-08-09 MED ORDER — DEXAMETHASONE SODIUM PHOSPHATE 10 MG/ML IJ SOLN
INTRAMUSCULAR | Status: DC | PRN
  Administered 2020-08-09: 8 mg via INTRAVENOUS

## 2020-08-09 MED ORDER — BUPIVACAINE LIPOSOME 1.3 % IJ SUSP
INTRAMUSCULAR | Status: DC | PRN
  Administered 2020-08-09: 10 mL via PERINEURAL

## 2020-08-09 MED ORDER — ROCURONIUM BROMIDE 10 MG/ML (PF) SYRINGE
PREFILLED_SYRINGE | INTRAVENOUS | Status: AC
  Filled 2020-08-09: qty 10

## 2020-08-09 MED ORDER — ONDANSETRON HCL 4 MG/2ML IJ SOLN
INTRAMUSCULAR | Status: DC | PRN
  Administered 2020-08-09: 4 mg via INTRAVENOUS

## 2020-08-09 MED ORDER — SUGAMMADEX SODIUM 200 MG/2ML IV SOLN
INTRAVENOUS | Status: DC | PRN
  Administered 2020-08-09: 200 mg via INTRAVENOUS

## 2020-08-09 MED ORDER — DEXTROSE 5 % IV SOLN
INTRAVENOUS | Status: DC | PRN
  Administered 2020-08-09: 3 g via INTRAVENOUS

## 2020-08-09 MED ORDER — TRAMADOL HCL 50 MG PO TABS: 50.0000 mg | ORAL_TABLET | Freq: Four times a day (QID) | ORAL | 0 refills | Status: DC | PRN

## 2020-08-09 MED ORDER — OXYCODONE HCL 5 MG/5ML PO SOLN: 5.0000 mg | Freq: Once | ORAL | Status: DC | PRN

## 2020-08-09 MED ORDER — ROCURONIUM BROMIDE 10 MG/ML (PF) SYRINGE
PREFILLED_SYRINGE | INTRAVENOUS | Status: DC | PRN
  Administered 2020-08-09: 30 mg via INTRAVENOUS
  Administered 2020-08-09: 20 mg via INTRAVENOUS

## 2020-08-09 MED ORDER — MIDAZOLAM HCL 2 MG/2ML IJ SOLN
INTRAMUSCULAR | Status: AC
  Administered 2020-08-09: 1 mg via INTRAVENOUS
  Filled 2020-08-09: qty 2

## 2020-08-09 MED ORDER — FENTANYL CITRATE (PF) 100 MCG/2ML IJ SOLN: 25.0000 ug | INTRAMUSCULAR | Status: DC | PRN

## 2020-08-09 MED ORDER — PROMETHAZINE HCL 25 MG/ML IJ SOLN: 6.2500 mg | INTRAMUSCULAR | Status: DC | PRN

## 2020-08-09 MED ORDER — ONDANSETRON HCL 4 MG/2ML IJ SOLN
INTRAMUSCULAR | Status: AC
  Filled 2020-08-09: qty 2

## 2020-08-09 MED ORDER — ACETAMINOPHEN 500 MG PO TABS
1000.0000 mg | ORAL_TABLET | Freq: Once | ORAL | Status: AC
  Administered 2020-08-09: 1000 mg via ORAL
  Filled 2020-08-09: qty 2

## 2020-08-09 MED ORDER — TECHNETIUM TC 99M TILMANOCEPT KIT
1.0000 | PACK | Freq: Once | INTRAVENOUS | Status: AC | PRN
  Administered 2020-08-09: 1 via INTRADERMAL

## 2020-08-09 MED ORDER — SUCCINYLCHOLINE CHLORIDE 200 MG/10ML IV SOSY
PREFILLED_SYRINGE | INTRAVENOUS | Status: DC | PRN
  Administered 2020-08-09: 200 mg via INTRAVENOUS

## 2020-08-09 MED ORDER — PROPOFOL 10 MG/ML IV BOLUS
INTRAVENOUS | Status: AC
  Filled 2020-08-09: qty 40

## 2020-08-09 SURGICAL SUPPLY — 51 items
ADH SKN CLS APL DERMABOND .7 (GAUZE/BANDAGES/DRESSINGS) ×1
APL PRP STRL LF DISP 70% ISPRP (MISCELLANEOUS) ×1
APPLIER CLIP 9.375 MED OPEN (MISCELLANEOUS) ×4
APR CLP MED 9.3 20 MLT OPN (MISCELLANEOUS) ×2
BINDER BREAST LRG (GAUZE/BANDAGES/DRESSINGS) IMPLANT
BINDER BREAST XLRG (GAUZE/BANDAGES/DRESSINGS) ×1 IMPLANT
BIOPATCH RED 1 DISK 7.0 (GAUZE/BANDAGES/DRESSINGS) ×1 IMPLANT
CANISTER SUCT 3000ML PPV (MISCELLANEOUS) ×2 IMPLANT
CHLORAPREP W/TINT 26 (MISCELLANEOUS) ×2 IMPLANT
CLIP APPLIE 9.375 MED OPEN (MISCELLANEOUS) ×1 IMPLANT
CNTNR URN SCR LID CUP LEK RST (MISCELLANEOUS) ×1 IMPLANT
CONT SPEC 4OZ STRL OR WHT (MISCELLANEOUS) ×6
COVER PROBE W GEL 5X96 (DRAPES) ×2 IMPLANT
COVER SURGICAL LIGHT HANDLE (MISCELLANEOUS) ×2 IMPLANT
COVER WAND RF STERILE (DRAPES) IMPLANT
DERMABOND ADVANCED (GAUZE/BANDAGES/DRESSINGS) ×1
DERMABOND ADVANCED .7 DNX12 (GAUZE/BANDAGES/DRESSINGS) ×1 IMPLANT
DEVICE DUBIN SPECIMEN MAMMOGRA (MISCELLANEOUS) ×3 IMPLANT
DRAIN CHANNEL 19F RND (DRAIN) ×1 IMPLANT
DRAPE CHEST BREAST 15X10 FENES (DRAPES) ×2 IMPLANT
DRSG TEGADERM 4X4.75 (GAUZE/BANDAGES/DRESSINGS) ×1 IMPLANT
ELECT CAUTERY BLADE 6.4 (BLADE) ×2 IMPLANT
ELECT REM PT RETURN 9FT ADLT (ELECTROSURGICAL) ×2
ELECTRODE REM PT RTRN 9FT ADLT (ELECTROSURGICAL) ×1 IMPLANT
EVACUATOR SILICONE 100CC (DRAIN) ×1 IMPLANT
GLOVE BIO SURGEON STRL SZ8 (GLOVE) ×2 IMPLANT
GLOVE SRG 8 PF TXTR STRL LF DI (GLOVE) ×1 IMPLANT
GLOVE SURG UNDER POLY LF SZ8 (GLOVE) ×2
GOWN STRL REUS W/ TWL LRG LVL3 (GOWN DISPOSABLE) ×1 IMPLANT
GOWN STRL REUS W/ TWL XL LVL3 (GOWN DISPOSABLE) ×1 IMPLANT
GOWN STRL REUS W/TWL LRG LVL3 (GOWN DISPOSABLE) ×2
GOWN STRL REUS W/TWL XL LVL3 (GOWN DISPOSABLE) ×2
HEMOSTAT ARISTA ABSORB 3G PWDR (HEMOSTASIS) ×2 IMPLANT
KIT BASIN OR (CUSTOM PROCEDURE TRAY) ×2 IMPLANT
KIT MARKER MARGIN INK (KITS) ×2 IMPLANT
LIGHT WAVEGUIDE WIDE FLAT (MISCELLANEOUS) IMPLANT
NDL 18GX1X1/2 (RX/OR ONLY) (NEEDLE) IMPLANT
NDL FILTER BLUNT 18X1 1/2 (NEEDLE) IMPLANT
NDL HYPO 25GX1X1/2 BEV (NEEDLE) ×1 IMPLANT
NEEDLE 18GX1X1/2 (RX/OR ONLY) (NEEDLE) IMPLANT
NEEDLE FILTER BLUNT 18X 1/2SAF (NEEDLE)
NEEDLE FILTER BLUNT 18X1 1/2 (NEEDLE) IMPLANT
NEEDLE HYPO 25GX1X1/2 BEV (NEEDLE) ×2 IMPLANT
NS IRRIG 1000ML POUR BTL (IV SOLUTION) ×2 IMPLANT
PACK GENERAL/GYN (CUSTOM PROCEDURE TRAY) ×2 IMPLANT
SUT ETHILON 2 0 FS 18 (SUTURE) ×1 IMPLANT
SUT MNCRL AB 4-0 PS2 18 (SUTURE) ×2 IMPLANT
SUT VIC AB 3-0 SH 18 (SUTURE) ×3 IMPLANT
SYR CONTROL 10ML LL (SYRINGE) ×2 IMPLANT
TOWEL GREEN STERILE (TOWEL DISPOSABLE) ×2 IMPLANT
TOWEL GREEN STERILE FF (TOWEL DISPOSABLE) ×2 IMPLANT

## 2020-08-09 NOTE — Op Note (Signed)
Preoperative diagnosis: Stage II right breast cancer upper outer quadrant lobular subtype  Postop diagnosis: Same  Procedure: Right breast seed localized lumpectomy with targeted right axillary lymph node biopsy followed by subsequent axillary lymph node dissection due to matted gross nodal disease  Surgeon: Erroll Luna, MD  Anesthesia: General with pectoral block and local anesthetic of 0.25% Marcaine plain  EBL: 100 cc  Specimen: Left breast mass with seed and clip verified by Faxitron with additional anterior medial margins and multiple right axillary matted lymph nodes to include the seed localized lymph node to pathology  Drains: 19 round drain to right axilla  IV fluids: Per anesthesia record  Indications for procedure: This patient is a 50 year old female that presents for treatment of her stage II right breast cancer.  She is evaluated by surgery, medical oncology and radiation oncology.  MRI showed a 2.3 cm mass located behind the nipple were complex.  She had core biopsies which showed this to be ER positive, PR positive, HER-2/neu negative with AKI 67 of 30%.  She was noted to have a positive lymph node at her initial work-up and MRI showed other small multiple abnormal appearing lymph nodes as well.  We discussed surgical options and she had a strong desire to conserve her breast.  She understood that if we found multiple nodes she may require node dissection at time of surgery to clear all gross disease from her and then subsequent chemotherapy and radiation therapy afterwards.  Neoadjuvant chemotherapy was discussed as well but she wished to proceed with surgery first depending on findings and to make a decision chemotherapy after completing surgical therapy.The procedure has been discussed with the patient. Alternatives to surgery have been discussed with the patient.  Risks of surgery include bleeding,  Infection,  Seroma formation, death,  and the need for further surgery.   The  patient understands and wishes to proceed.Sentinel lymph node mapping and dissection has been discussed with the patient.  Risk of bleeding,  Infection,  Seroma formation, lymphedema, additional procedures,,  Shoulder weakness ,  Shoulder stiffness,  Nerve and blood vessel injury and reaction to the mapping dyes have been discussed.  Alternatives to surgery have been discussed with the patient.  The patient agrees to proceed.   Description of procedure: The patient was met in the holding area and questions were answered.  Seed localization was performed per radiology order today to localize the mass as well as the positive lymph node.  General pectoral block.  She underwent injection of technetium sulfur colloid per radiology protocol.  All questions were answered.  She was then taken back to the operating.  She is placed supine upon the OR table.  After induction of general esthesia, right breast was prepped and draped in sterile fashion timeout performed.  Proper patient, site and procedure were verified.  Films were available for review.  Neoprobe was used to identify the mass which was retroareolar.  Curvilinear incision was made along the inferior border of the nipple areolar complex.  Dissection was carried down to the mass and all tissue was removed around the mass to include the seed and clip with a grossly negative margin.  I did take the anterior portion up to the nipple and did a shave biopsy of the nipple as the anterior margin sent that as a separate margin.  The medial margin to close as well therefore took it as a separate margin.  All margins are inked and fixed and sent to pathology.  Hemostasis was achieved.  The cavity was closed with multiple layers of 3-0 Vicryl for Monocryl.  The skin was closed with 0 Monocryl.  Neoprobe was used to identify the seed in the right axilla.  Incision was made dissection was carried down into the right axilla contents.  Upon entering the contents the neoprobe  was used to identify the seed noted and this was removed.  She had multiple matted lymph nodes that did not appear solely reactive.  There was a history of COVID vaccination on the arm as well but these appear hard and rubbery more consistent with metastatic disease and less likely reactive.  Given the large matted adenopathy, I felt that lymph node dissection was necessary to clear all gross disease.  All lymphovascular tissue between the axillary vein, long thoracic nerve and thoracodorsal trunk were removed.  I took this actually into the level 2 lymph node basin as well since she had significant adenopathy to resect all gross adenopathy.  These nodes were matted as well.  Hemostasis was achieved with cautery, clips and Arista.  Through a separate stab incision a 19 round drain was placed into the axillary basin.  This was secured with 2-0 nylon.  After ensuring hemostasis, 2-0 Vicryl was used to close the subcutaneous tissues.  4 Monocryl was used to close the skin.  Dermabond was applied.  Drain was placed to bulb suction.  All counts were found to be correct.  Breast binder placed.  The patient was awoke extubated taken to recovery in satisfactory condition.

## 2020-08-09 NOTE — Discharge Instructions (Signed)
[place] Office Phone Number [phone]  BREAST BIOPSY/ PARTIAL MASTECTOMY: POST OP INSTRUCTIONS  Always review your discharge instruction sheet given to you by the facility where your surgery was performed.  IF YOU HAVE DISABILITY OR FAMILY LEAVE FORMS, YOU MUST BRING THEM TO THE OFFICE FOR PROCESSING.  DO NOT GIVE THEM TO YOUR DOCTOR.  1. A prescription for pain medication may be given to you upon discharge.  Take your pain medication as prescribed, if needed.  If narcotic pain medicine is not needed, then you may take acetaminophen (Tylenol) or ibuprofen (Advil) as needed. 2. Take your usually prescribed medications unless otherwise directed 3. If you need a refill on your pain medication, please contact your pharmacy.  They will contact our office to request authorization.  Prescriptions will not be filled after 5pm or on week-ends. 4. You should eat very light the first 24 hours after surgery, such as soup, crackers, pudding, etc.  Resume your normal diet the day after surgery. 5. Most patients will experience some swelling and bruising in the breast.  Ice packs and a good support bra will help.  Swelling and bruising can take several days to resolve.  6. It is common to experience some constipation if taking pain medication after surgery.  Increasing fluid intake and taking a stool softener will usually help or prevent this problem from occurring.  A mild laxative (Milk of Magnesia or Miralax) should be taken according to package directions if there are no bowel movements after 48 hours. 7. Unless discharge instructions indicate otherwise, you may remove your bandages 24-48 hours after surgery, and you may shower at that time.  You may have steri-strips (small skin tapes) in place directly over the incision.  These strips should be left on the skin for 7-10 days.  If your surgeon used skin glue on the incision, you may shower in 24 hours.  The glue will flake off over the  next 2-3 weeks.  Any sutures or staples will be removed at the office during your follow-up visit. 8. ACTIVITIES:  You may resume regular daily activities (gradually increasing) beginning the next day.  Wearing a good support bra or sports bra minimizes pain and swelling.  You may have sexual intercourse when it is comfortable. a. You may drive when you no longer are taking prescription pain medication, you can comfortably wear a seatbelt, and you can safely maneuver your car and apply brakes. b. RETURN TO WORK:  ______________________________________________________________________________________ 9. You should see your doctor in the office for a follow-up appointment approximately two weeks after your surgery.  Your doctor's nurse will typically make your follow-up appointment when she calls you with your pathology report.  Expect your pathology report 2-3 business days after your surgery.  You may call to check if you do not hear from [place] after three days. 10. OTHER INSTRUCTIONS: _______________________________________________________________________________________________ _____________________________________________________________________________________________________________________________________ _____________________________________________________________________________________________________________________________________ _____________________________________________________________________________________________________________________________________  WHEN TO CALL YOUR DOCTOR: 1. Fever over 101.0 2. Nausea and/or vomiting. 3. Extreme swelling or bruising. 4. Continued bleeding from incision. 5. Increased pain, redness, or drainage from the incision.  The clinic staff is available to answer your questions during regular business hours.  Please don't hesitate to call and ask to speak to one of the nurses for clinical concerns.  If you have a medical emergency, go to the nearest  emergency room or call 911.  A surgeon from [facility] is always on call at the hospital.  For further questions, please visit [email or web address]  [facility] Surgical drains are used to remove extra fluid that normally builds up in a surgical wound after surgery. A surgical drain helps to heal a surgical wound. Different kinds of surgical drains include:  Active drains. These drains use suction to pull drainage away from the surgical wound. Drainage flows through a tube to a container outside of the body. With these drains, you need to keep the bulb or the drainage container flat (compressed) at all times, except while you empty it. Flattening the bulb or container creates suction.  Passive drains. These drains allow fluid to drain naturally, by gravity. Drainage flows through a tube to a bandage (dressing) or a container outside of the body. Passive drains do not need to be emptied. A drain is placed during surgery. Right after surgery, drainage is usually bright red and a little thicker than water. The drainage may gradually turn yellow or pink and become thinner. It is likely that your health care provider will remove the drain when the drainage stops or when the amount decreases to 1-2 Tbsp (15-30 mL) during a 24-hour period. Supplies needed:  Tape.  Germ-free cleaning solution (sterile saline).  Cotton swabs.  Split gauze drain sponge: 4 x 4 inches (10 x 10 cm).  Gauze square: 4 x 4 inches (10 x 10 cm). How to care for your surgical drain Care for your drain as told by your health care provider. This is important to help prevent infection. If your drain is placed at your back, or any other hard-to-reach area, ask another person to assist you in performing the following tasks: General care  Keep the skin around the drain dry and covered with a dressing at all times.  Check your drain area every day for signs of infection. Check for: ? Redness,  swelling, or pain. ? Pus or a bad smell. ? Cloudy drainage. ? Tenderness or pressure at the drain exit site. Changing the dressing Follow instructions from your health care provider about how to change your dressing. Change your dressing at least once a day. Change it more often if needed to keep the dressing dry. Make sure you: 1. Gather your supplies. 2. Wash your hands with soap and water before you change your dressing. If soap and water are not available, use hand sanitizer. 3. Remove the old dressing. Avoid using scissors to do that. 4. Wash your hands with soap and water again after removing the old dressing. 5. Use sterile saline to clean your skin around the drain. You may need to use a cotton swab to clean the skin. 6. Place the tube through the slit in a drain sponge. Place the drain sponge so that it covers your wound. 7. Place the gauze square or another drain sponge on top of the drain sponge that is on the wound. Make sure the tube is between those layers. 8. Tape the dressing to your skin. 9. Tape the drainage tube to your skin 1-2 inches (2.5-5 cm) below the place where the tube enters your body. Taping keeps the tube from pulling on any stitches (sutures) that you have. 10. Wash your hands with soap and water. 11. Write down the color of your drainage and how often you change your dressing. How to empty your active drain 1. Make sure that you have a measuring cup that you can empty your drainage into. 2. Wash your hands with soap and water. If soap and water are not available, use hand sanitizer. 3. Loosen  any pins or clips that hold the tube in place. 4. If your health care provider tells you to strip the tube to prevent clots and tube blockages: ? Hold the tube at the skin with one hand. Use your other hand to pinch the tubing with your thumb and first finger. ? Gently move your fingers down the tube while squeezing very lightly. This clears any drainage, clots, or tissue  from the tube. ? You may need to do this several times each day to keep the tube clear. Do not pull on the tube. 5. Open the bulb cap or the drain plug. Do not touch the inside of the cap or the bottom of the plug. 6. Turn the device upside down and gently squeeze. 7. Empty all of the drainage into the measuring cup. 8. Compress the bulb or the container and replace the cap or the plug. To compress the bulb or the container, squeeze it firmly in the middle while you close the cap or plug the container. 9. Write down the amount of drainage that you have in each 24-hour period. If you have less than 2 Tbsp (30 mL) of drainage during 24 hours, contact your health care provider. 10. Flush the drainage down the toilet. 11. Wash your hands with soap and water.   Contact a health care provider if:  You have redness, swelling, or pain around your drain area.  You have pus or a bad smell coming from your drain area.  You have a fever or chills.  The skin around your drain is warm to the touch.  The amount of drainage that you have is increasing instead of decreasing.  You have drainage that is cloudy.  There is a sudden stop or a sudden decrease in the amount of drainage that you have.  Your drain tube falls out.  Your active drain does not stay compressed after you empty it. Summary  Surgical drains are used to remove extra fluid that normally builds up in a surgical wound after surgery.  Different kinds of surgical drains include active drains and passive drains. Active drains use suction to pull drainage away from the surgical wound, and passive drains allow fluid to drain naturally.  It is important to care for your drain to prevent infection. If your drain is placed at your back, or any other hard-to-reach area, ask another person to assist you.  Contact your health care provider if you have redness, swelling, or pain around your drain area. This information is not intended to  replace advice given to you by your health care provider. Make sure you discuss any questions you have with your health care provider. Document Revised: 07/22/2018 Document Reviewed: 07/22/2018 Elsevier Patient Education  2021 [employer].

## 2020-08-09 NOTE — Interval H&P Note (Signed)
History and Physical Interval Note:  08/09/2020 11:36 AM  Shelby [name]  has presented today for surgery, with the diagnosis of RIGHT BREAST CANCER.  The various methods of treatment have been discussed with the patient and family. After consideration of risks, benefits and other options for treatment, the patient has consented to  Procedure(s) with comments: RIGHT BREAST LUMPECTOMY WITH RADIOACTIVE SEED AND RIGHT BREAST RADIOACTIVE SEED TARGETED SENTINEL LYMPH NODE BIOPSY, RIGHT BREAST SENTINEL LYMPH NODE MAPPING (Right) - PEC BLOCK as a surgical intervention.  The patient's history has been reviewed, patient examined, no change in status, stable for surgery.  I have reviewed the patient's chart and labs.  Questions were answered to the patient's satisfaction.     [place]

## 2020-08-09 NOTE — H&P (Signed)
Shelby [name] Appointment: 07/07/2020 9:20 AM Location: [facility] Patient #: [id number] DOB: 04-27-1971 Married / Language: [place] / Race: White Female  History of Present Illness Maisie Fus A. Curtis Cain MD; 07/07/2020 12:34 PM) Patient words: Patient presents for evaluation of a newly diagnosed right breast cancer detected on recent mammogram. The patient some bruising of her right breast which prompted mammographic imaging. She was found to have a 2.4 cm retroareolar mass. Core biopsy showed invasive mammary carcinoma consistent with a lobular subtype. She also had a positive right axillary lymph node was biopsied. The tumor was hormone receptor positive her 2 neu pending. Patient denies any mass, discharge or pain prior to her mammogram. He bruising change noted was about 3 months ago.     CLINICAL DATA: Screening recall for right breast mass and abnormal right axillary lymph nodes.  EXAM: DIGITAL DIAGNOSTIC UNILATERAL RIGHT MAMMOGRAM WITH TOMO AND CAD; ULTRASOUND RIGHT BREAST LIMITED  COMPARISON: Previous exams.  ACR Breast Density Category b: There are scattered areas of fibroglandular density.  FINDINGS: Spot compression tomograms were performed of the right breast. There is an irregular mass in the retroareolar right breast with associated nipple retraction, with the mass measuring approximately 2.4 cm. The abnormal lymph nodes seen on the initial screening mammogram could not be included in the field of view on the spot compression MLO tomograms.  Mammographic images were processed with CAD.  Physical examination reveals mild retraction/flattening of the right nipple with associated palpable retroareolar thickening.  Targeted ultrasound of the right breast was performed. There is an irregular somewhat ill-defined mixed echogenicity predominantly hypoechoic mass in the retroareolar right breast measuring 2.4 x 1.7 x 1.3 cm.  There are 2  morphologically abnormal lymph nodes in the right axilla with the more superficial lymph node measuring up to 1.9 cm, with a cortex thickness of 1.1 cm.  IMPRESSION: 1. Suspicious 2.4 cm mass in the retroareolar right breast.  2. Suspicious morphologically abnormal lymph nodes in the right axilla.  RECOMMENDATION: 1. Recommend ultrasound-guided biopsy of the mass in the retroareolar right breast.  2. Recommend ultrasound guided biopsy of 1 of the abnormal lymph nodes in the right axilla.  I have discussed the findings and recommendations with the patient. If applicable, a reminder letter will be sent to the patient regarding the next appointment.  BI-RADS CATEGORY 4: Suspicious.   Electronically Signed By: Edwin Cap M.D. On: 07/04/2020 09:52   SURGICAL PATHOLOGY CASE: [id number] PATIENT: Shelby [name] Surgical Pathology Report   Reason for Addendum #1: Immunohistochemistry results Reason for Addendum #2: Breast Biomarker Results  Clinical History: mass  ADDENDUM:  E-cadherin is negative consistent with a lobular phenotype.   FINAL MICROSCOPIC DIAGNOSIS:  A. BREAST, RETROAREOLAR, RIGHT, BIOPSY: - Invasive mammary carcinoma, see comment.  B. LYMPH NODE, AXILLA, RIGHT, BIOPSY: - Metastatic carcinoma in a lymph node.  COMMENT:  A. The carcinoma appears grade 1-2 and measures 9 mm in greatest linear extent. E-cadherin will be ordered. Prognostic makers will be ordered. Dr. Luisa Hart has reviewed the case. Randa Lynn was attempted on 07/05/2020.    Estrogen Receptor: POSITIVE, 95% MODERATE STAINING INTENSITY Progesterone Receptor: POSITIVE,> 95% STRONG STAINING INTENSITY Proliferation Marker Ki-67: 30.  The patient is a 50 year old female.   Past Surgical History Mertha Finders, CMA; 07/07/2020 9:23 AM) Breast Biopsy Right.  Diagnostic Studies History Mertha Finders, CMA; 07/07/2020 9:23  AM) Colonoscopy never Mammogram 1-3 years ago Pap Smear 1-5 years ago  Allergies (Kheana Marshall-McBride, CMA; 07/07/2020 9:23 AM) No  Known Drug Allergies [07/07/2020]: Allergies Reconciled  Medication History (Kheana Marshall-McBride, CMA; 07/07/2020 9:24 AM) Cetirizine HCl (10MG  Tablet, Oral) Active. Citalopram Hydrobromide (20MG  Tablet, Oral) Active. Loratadine (10MG  Tablet, Oral) Active. Metoprolol Tartrate (50MG  Tablet, Oral) Active. Pantoprazole Sodium (40MG  Tablet DR, Oral) Active. Medications Reconciled  Family History Mertha Finders, CMA; 07/07/2020 9:23 AM) First Degree Relatives No pertinent family history  Pregnancy / Birth History Mertha Finders, CMA; 07/07/2020 9:23 AM) Age at menarche 11 years. Age of menopause 17-55 Gravida 2 Irregular periods Maternal age 29-20  Other Problems Mertha Finders, CMA; 07/07/2020 9:23 AM) Breast Cancer     Review of Systems Zigmund Gottron Marshall-McBride CMA; 07/07/2020 9:23 AM) General Not Present- Appetite Loss.  Vitals (Kheana Marshall-McBride CMA; 07/07/2020 9:24 AM) 07/07/2020 9:24 AM Weight: 418.38 lb Height: 65in Body Surface Area: 2.71 m Body Mass Index: 69.62 kg/m  Temp.: 97.33F  Pulse: 84 (Regular)  P.OX: 97% (Room air) BP: 124/76(Sitting, Left Arm, Standard)        Physical Exam ([place] Glorioso A. Analiyah Lechuga MD; 07/07/2020 12:35 PM)  General Mental Status-Alert. General Appearance-Consistent with stated age. Hydration-Well hydrated. Voice-Normal.  Breast Note: Large pendulous breasts. Bruising noted. No discernible mass on the right noted. The nipple is relatively flat. No significant dimpling though. Left nipple is normal. Left breast is otherwise normal.  Neurologic Neurologic evaluation reveals -alert and oriented x 3 with no impairment of recent or remote memory. Mental Status-Normal.  Musculoskeletal Normal Exam - Left-Upper  Extremity Strength Normal and Lower Extremity Strength Normal. Normal Exam - Right-Upper Extremity Strength Normal and Lower Extremity Strength Normal.  Lymphatic Head & Neck  General Head & Neck Lymphatics: Bilateral - Description - Normal. Axillary  General Axillary Region: Bilateral - Description - Normal. Tenderness - Non Tender.    Assessment & Plan (Khamila Bassinger A. Zalayah Pizzuto MD; 07/07/2020 12:36 PM)  STAGE 2 CARCINOMA OF BREAST, ER+ (C50.919) Impression: right 2.4 cm mass T2 N1 MX Lobular subtype Magnetic resonance imaging recommended if able. Due to her large BMI this may not be possible. If so, we can proceed because she desires right breast C localized lumpectomy with targeted right axillary sentinel lymph node mapping and seed localized right lymph node biopsy Refer to medical and radiation oncology Explaining the importance of weight loss in her case given her very large BMI and increased operative complication rate. Discussed mastectomy with reconstruction the patient is opted for breast conservation. Risk of lumpectomy include bleeding, infection, seroma, more surgery, use of seed/wire, wound care, cosmetic deformity and the need for other treatments, death , blood clots, death. Pt agrees to proceed. Risk of sentinel lymph node mapping include bleeding, infection, lymphedema, shoulder pain. stiffness, dye allergy. cosmetic deformity , blood clots, death, need for more surgery. Pt agrees to proceed.  Current Plans You are being scheduled for surgery- Our schedulers will call you.  You should hear from our office's scheduling department within 5 working days about the location, date, and time of surgery. We try to make accommodations for patient's preferences in scheduling surgery, but sometimes the OR schedule or the surgeon's schedule prevents [place] from making those accommodations.  If you have not heard from our office [phone]) in 5 working days, call the office and  ask for your surgeon's nurse.  If you have other questions about your diagnosis, plan, or surgery, call the office and ask for your surgeon's nurse.  Pt Education - [facility] Breast Cancer Information Given - [employer] "Breast Journey" Package We discussed the staging and pathophysiology of breast cancer.  We discussed all of the different options for treatment for breast cancer including surgery, chemotherapy, radiation therapy, Herceptin, and antiestrogen therapy. We discussed a sentinel lymph node biopsy as she does not appear to having lymph node involvement right now. We discussed the performance of that with injection of radioactive tracer and blue dye. We discussed that she would have an incision underneath her axillary hairline. We discussed that there is a bout a 10-20% chance of having a positive node with a sentinel lymph node biopsy and we will await the permanent pathology to make any other first further decisions in terms of her treatment. One of these options might be to return to the operating room to perform an axillary lymph node dissection. We discussed about a 1-2% risk lifetime of chronic shoulder pain as well as lymphedema associated with a sentinel lymph node biopsy. We discussed the options for treatment of the breast cancer which included lumpectomy versus a mastectomy. We discussed the performance of the lumpectomy with a wire placement. We discussed a 10-20% chance of a positive margin requiring reexcision in the operating room. We also discussed that she may need radiation therapy or antiestrogen therapy or both if she undergoes lumpectomy. We discussed the mastectomy and the postoperative care for that as well. We discussed that there is no difference in her survival whether she undergoes lumpectomy with radiation therapy or antiestrogen therapy versus a mastectomy. There is a slight difference in the local recurrence rate being 3-5% with lumpectomy and about 1% with a mastectomy. We  discussed the risks of operation including bleeding, infection, possible reoperation. She understands her further therapy will be based on what her stages at the time of her operation.  Pt Education - [facility] breast cancer surgery: discussed with patient and provided information. Pt Education - ABC (After Breast Cancer) Class Info: discussed with patient and provided information.

## 2020-08-09 NOTE — Anesthesia Postprocedure Evaluation (Signed)
Anesthesia Post Note  Patient: Shelby [name]  Procedure(s) Performed: RIGHT BREAST LUMPECTOMY WITH RADIOACTIVE SEED AND RIGHT BREAST RADIOACTIVE SEED TARGETED SENTINEL LYMPH NODE BIOPSY, RIGHT BREAST SENTINEL LYMPH NODE MAPPING (Right Breast)     Patient location during evaluation: PACU Anesthesia Type: General Level of consciousness: awake and alert and oriented Pain management: pain level controlled Vital Signs Assessment: post-procedure vital signs reviewed and stable Respiratory status: spontaneous breathing, nonlabored ventilation and respiratory function stable Cardiovascular status: blood pressure returned to baseline and stable Postop Assessment: no apparent nausea or vomiting Anesthetic complications: no   No complications documented.  Last Vitals:  Vitals:   08/09/20 1458 08/09/20 1513  BP: 135/81 138/84  Pulse: 79 79  Resp: 17 (!) 25  Temp:  36.9 C  SpO2: 96% 98%    Last Pain:  Vitals:   08/09/20 1530  TempSrc:   PainSc: 0-No pain                 Morena Mckissack A.

## 2020-08-09 NOTE — Anesthesia Procedure Notes (Signed)
Anesthesia Regional Block: Pectoralis block   Pre-Anesthetic Checklist: ,, timeout performed, Correct Patient, Correct Site, Correct Laterality, Correct Procedure, Correct Position, site marked, Risks and benefits discussed, pre-op evaluation,  At surgeon's request and post-op pain management  Laterality: Right  Prep: Maximum Sterile Barrier Precautions used, chloraprep       Needles:  Injection technique: Single-shot  Needle Type: Echogenic Stimulator Needle     Needle Length: 9cm  Needle Gauge: 22     Additional Needles:   Procedures:,,,, ultrasound used (permanent image in chart),,,,  Narrative:  Start time: 08/09/2020 11:00 AM End time: 08/09/2020 11:03 AM Injection made incrementally with aspirations every 5 mL.  Performed by: Personally  Anesthesiologist: Brennan Bailey, MD  Additional Notes: Risks, benefits, and alternative discussed. Patient gave consent for procedure. Patient prepped and draped in sterile fashion. Sedation administered, patient remains easily responsive to voice. Relevant anatomy identified with ultrasound guidance. Local anesthetic given in 5cc increments with no signs or symptoms of intravascular injection. No pain or paraesthesias with injection. Patient monitored throughout procedure with signs of LAST or immediate complications. Tolerated well. Ultrasound image placed in chart.  Tawny Asal, MD

## 2020-08-09 NOTE — Transfer of Care (Signed)
Immediate Anesthesia Transfer of Care Note  Patient: Shelby [name]  Procedure(s) Performed: RIGHT BREAST LUMPECTOMY WITH RADIOACTIVE SEED AND RIGHT BREAST RADIOACTIVE SEED TARGETED SENTINEL LYMPH NODE BIOPSY, RIGHT BREAST SENTINEL LYMPH NODE MAPPING (Right Breast)  Patient Location: PACU  Anesthesia Type:General and Regional  Level of Consciousness: drowsy and patient cooperative  Airway & Oxygen Therapy: Patient Spontanous Breathing and Patient connected to face mask oxygen  Post-op Assessment: Report given to RN and Post -op Vital signs reviewed and stable  Post vital signs: Reviewed and stable  Last Vitals:  Vitals Value Taken Time  BP 152/86 08/09/20 1427  Temp    Pulse 88 08/09/20 1429  Resp 24 08/09/20 1429  SpO2 86 % 08/09/20 1429  Vitals shown include unvalidated device data.  Last Pain:  Vitals:   08/09/20 1115  TempSrc:   PainSc: 0-No pain      Patients Stated Pain Goal: 3 (01/60/10 9323)  Complications: No complications documented.

## 2020-08-09 NOTE — Anesthesia Procedure Notes (Addendum)
Procedure Name: Intubation Date/Time: 08/09/2020 1:52 PM Performed by: Kathryne Hitch, CRNA Pre-anesthesia Checklist: Patient identified, Emergency Drugs available, Suction available and Patient being monitored Patient Re-evaluated:Patient Re-evaluated prior to induction Oxygen Delivery Method: Circle system utilized Preoxygenation: Pre-oxygenation with 100% oxygen Induction Type: IV induction Ventilation: Mask ventilation without difficulty Laryngoscope Size: Miller and 2 Grade View: Grade I Tube type: Oral Tube size: 7.0 mm Number of attempts: 2 Airway Equipment and Method: Stylet and Oral airway Placement Confirmation: ETT inserted through vocal cords under direct vision,  positive ETCO2 and breath sounds checked- equal and bilateral Secured at: 22 cm Tube secured with: Tape Dental Injury: Teeth and Oropharynx as per pre-operative assessment  Comments: First attempt by CRNA unable to visualize glottis with Miller 2 blade. Second attempt by Erasmo Downer, MD with redundant tissue noted, but grade 1 view with Miller 2 blade. Atraumatic intubation. Daiva Huge, MD

## 2020-08-10 ENCOUNTER — Encounter (HOSPITAL_COMMUNITY): Payer: Self-pay | Admitting: Surgery

## 2020-08-10 NOTE — Addendum Note (Signed)
Addendum  created 08/10/20 0755 by Brennan Bailey, MD   Clinical Note Signed, Intraprocedure Blocks edited

## 2020-08-15 LAB — SURGICAL PATHOLOGY

## 2020-08-16 ENCOUNTER — Encounter: Payer: Self-pay | Admitting: *Deleted

## 2020-08-16 ENCOUNTER — Inpatient Hospital Stay: Payer: BC Managed Care – PPO | Admitting: Hematology and Oncology

## 2020-08-21 ENCOUNTER — Encounter: Payer: Self-pay | Admitting: Nurse Practitioner

## 2020-08-21 ENCOUNTER — Telehealth: Payer: Self-pay | Admitting: *Deleted

## 2020-08-21 ENCOUNTER — Ambulatory Visit (INDEPENDENT_AMBULATORY_CARE_PROVIDER_SITE_OTHER): Payer: BC Managed Care – PPO | Admitting: Nurse Practitioner

## 2020-08-21 DIAGNOSIS — K219 Gastro-esophageal reflux disease without esophagitis: Secondary | ICD-10-CM

## 2020-08-21 DIAGNOSIS — Z5189 Encounter for other specified aftercare: Secondary | ICD-10-CM | POA: Diagnosis not present

## 2020-08-21 DIAGNOSIS — F419 Anxiety disorder, unspecified: Secondary | ICD-10-CM

## 2020-08-21 DIAGNOSIS — F321 Major depressive disorder, single episode, moderate: Secondary | ICD-10-CM | POA: Diagnosis not present

## 2020-08-21 MED ORDER — NYSTATIN 100000 UNIT/GM EX POWD: 1.0000 "application " | Freq: Two times a day (BID) | CUTANEOUS | 2 refills | Status: DC

## 2020-08-21 MED ORDER — PANTOPRAZOLE SODIUM 40 MG PO TBEC: 40.0000 mg | DELAYED_RELEASE_TABLET | Freq: Every day | ORAL | 2 refills | Status: DC

## 2020-08-21 NOTE — Progress Notes (Signed)
HEMATOLOGY-ONCOLOGY MYCHART VIDEO VISIT PROGRESS NOTE  I connected with Shelby [name] on 08/22/2020 at 10:45 AM EST by MyChart video conference and verified that I am speaking with the correct person using two identifiers.  I discussed the limitations, risks, security and privacy concerns of performing an evaluation and management service by MyChart and the availability of in person appointments.  I also discussed with the patient that there may be a patient responsible charge related to this service. The patient expressed understanding and agreed to proceed.  Patient's Location: Home Physician Location: Clinic  CHIEF COMPLIANT: Follow-up s/p lumpectomy   INTERVAL HISTORY: Shelby [name] is a 50 y.o. female with above-mentioned history of right breast cancer. She underwent a right lumpectomy with Dr. Brantley Stage on 08/09/20 for which pathology showed invasive lobular carcinoma, 3.8cm, clear margins, 19/19 right axillary lymph nodes positive for metastatic carcinoma. She presents over MyChart today to review the pathology report and discuss further treatment.  She still has a lot of pain and discomfort under the arm.  She still has drains as well.  Oncology History  Malignant neoplasm of central portion of right breast in female, estrogen receptor positive ([place])  07/04/2020 Initial Diagnosis   Screening mammogram detected right breast mass and right axillary lymph nodes.  Right breast nipple retraction and retroareolar thickening.  2.4 cm mass with 2 abnormal lymph nodes.  Biopsy revealed invasive lobular cancer grade 1-2 ER 95%, PR 95%, Ki-67 30%, HER2 equivocal by IHC negative by FISH ratio 1.39   07/17/2020 Cancer Staging   Staging form: Breast, AJCC 8th Edition - Clinical stage from 07/17/2020: Stage IIA (cT2, cN1, cM0, G2, ER+, PR+, HER2-) - Signed by Nicholas Lose, MD on 07/17/2020   08/09/2020 Surgery   Rght lumpectomy ([place]): invasive lobular carcinoma, 3.8cm, clear margins, 19/19 right axillary  lymph nodes positive for metastatic carcinoma.      Observations/Objective:  There were no vitals filed for this visit. There is no height or weight on file to calculate BMI.  I have reviewed the data as listed CMP Latest Ref Rng & Units 08/03/2020 07/11/2020  Glucose 70 - 99 mg/dL 115(H) 113(H)  BUN 6 - 20 mg/dL 12 11  Creatinine 0.44 - 1.00 mg/dL 0.77 0.75  Sodium 135 - 145 mmol/L 138 145(H)  Potassium 3.5 - 5.1 mmol/L 3.9 4.5  Chloride 98 - 111 mmol/L 103 103  CO2 22 - 32 mmol/L 24 20  Calcium 8.9 - 10.3 mg/dL 9.1 9.1  Total Protein 6.5 - 8.1 g/dL 7.1 7.4  Total Bilirubin 0.3 - 1.2 mg/dL 0.8 0.4  Alkaline Phos 38 - 126 U/L 77 112  AST 15 - 41 U/L 23 26  ALT 0 - 44 U/L 41 42(H)    Lab Results  Component Value Date   WBC 8.7 08/03/2020   HGB 15.4 (H) 08/03/2020   HCT 45.1 08/03/2020   MCV 90.2 08/03/2020   PLT 285 08/03/2020   NEUTROABS 5.9 08/03/2020      Assessment Plan:  Malignant neoplasm of central portion of right breast in female, estrogen receptor positive ([place]) 07/04/2020:Screening mammogram detected right breast mass and right axillary lymph nodes.  Right breast nipple retraction and retroareolar thickening.  2.4 cm mass with 2 abnormal lymph nodes.  Biopsy revealed invasive lobular cancer grade 1-2 ER 95%, PR 95%, Ki-67 30%, HER2 equivocal by IHC negative by FISH ratio 1.39 T2N1 stage IIA  ILC 3.8 cm, 19/19 LN Positive, Margins Neg  Pathology counseling: I discussed the final  pathology report of the patient provided  a copy of this report. I discussed the margins as well as lymph node surgeries. We also discussed the final staging along with previously performed ER/PR and HER-2/neu testing.  Plan: 1. Staging scans 2. Adjuvant chemo with TC X 6 (because of her propensity for diabetes she may only end up getting 4) 3. XRT 4. Adj Anti estrogen therapy (with Verzenio)  Chemotherapy Counseling: I discussed the risks and benefits of chemotherapy including the risks  of nausea/ vomiting, risk of infection from low WBC count, fatigue due to chemo or anemia, bruising or bleeding due to low platelets, mouth sores, loss/ change in taste and decreased appetite. Liver and kidney function will be monitored through out chemotherapy as abnormalities in liver and kidney function may be a side effect of treatment.  neuropathy risk of Taxotere were discussed in detail. Risk of permanent bone marrow dysfunction and leukemia due to chemo were also discussed.  Patient is very concerned about distant metastases.  We briefly discussed with her that if she does have distant metastatic disease, our treatment approach might change from curative approach to a palliative approach. She is terrified and is anxious to get the scans done as soon as possible.  RTC in 2 weeks to start chemo if the scans are negative for distant metastatic disease.     I discussed the assessment and treatment plan with the patient. The patient was provided an opportunity to ask questions and all were answered. The patient agreed with the plan and demonstrated an understanding of the instructions. The patient was advised to call back or seek an in-person evaluation if the symptoms worsen or if the condition fails to improve as anticipated.   Total time spent: 30 minutes including face-to-face MyChart video visit time and time spent for planning, charting and coordination of care  Rulon Eisenmenger, MD 08/22/2020   I, Cloyde Reams Dorshimer, am acting as scribe for Nicholas Lose, MD.  I have reviewed the above documentation for accuracy and completeness, and I agree with the above.

## 2020-08-21 NOTE — Telephone Encounter (Signed)
PA in process (Key: [id number]) Rx #: [id number] Pantoprazole Sodium 40MG  dr tablets

## 2020-08-21 NOTE — Assessment & Plan Note (Signed)
GERD well-controlled on Protonix 20 mg tablet daily.  Rx refill sent to pharmacy.  Continue healthy diet, weight loss.

## 2020-08-21 NOTE — Assessment & Plan Note (Signed)
07/04/2020:Screening mammogram detected right breast mass and right axillary lymph nodes.  Right breast nipple retraction and retroareolar thickening.  2.4 cm mass with 2 abnormal lymph nodes.  Biopsy revealed invasive lobular cancer grade 1-2 ER 95%, PR 95%, Ki-67 30%, HER2 equivocal by IHC negative by FISH ratio 1.39 T2N1 stage IIA  ILC 3.8 cm, 19/19 LN Positive, Margins Neg  Pathology counseling: I discussed the final pathology report of the patient provided  a copy of this report. I discussed the margins as well as lymph node surgeries. We also discussed the final staging along with previously performed ER/PR and HER-2/neu testing.  Plan: 1. Staging scans 2. Adjuvant chemo with TC X 6 3. XRT 4. Adj Anti estrogen therapy  Chemotherapy Counseling: I discussed the risks and benefits of chemotherapy including the risks of nausea/ vomiting, risk of infection from low WBC count, fatigue due to chemo or anemia, bruising or bleeding due to low platelets, mouth sores, loss/ change in taste and decreased appetite. Liver and kidney function will be monitored through out chemotherapy as abnormalities in liver and kidney function may be a side effect of treatment.  neuropathy risk of Taxotere were discussed in detail. Risk of permanent bone marrow dysfunction and leukemia due to chemo were also discussed.  RTC in 2 weeks to start chemo

## 2020-08-21 NOTE — Assessment & Plan Note (Signed)
Wound healing as expected.  Continue nystatin powder and Bactroban twice daily.  On lower abdomen.  Rx refill sent to pharmacy

## 2020-08-21 NOTE — Progress Notes (Signed)
Virtual Visit via telephone Note Due to COVID-19 pandemic this visit was conducted virtually. This visit type was conducted due to national recommendations for restrictions regarding the COVID-19 Pandemic (e.g. social distancing, sheltering in place) in an effort to limit this patient's exposure and mitigate transmission in our community. All issues noted in this document were discussed and addressed.  A physical exam was not performed with this format.  I connected with Shelby [name] on 08/21/20 at 9:13 AM by telephone and verified that I am speaking with the correct person using two identifiers. Shelby [name] is currently located at home during visit. The provider, [name], NP is located in their office at time of visit.  I discussed the limitations, risks, security and privacy concerns of performing an evaluation and management service by telephone and the availability of in person appointments. I also discussed with the patient that there may be a patient responsible charge related to this service. The patient expressed understanding and agreed to proceed.   History and Present Illness:  HPI   GERD, Follow up:  The patient was last seen for GERD 3 years ago. Changes made since that visit include none.  She reports good compliance with treatment. She is not having side effects. .  She IS experiencing bilious reflux. She is NOT experiencing abdominal bloating, choking on food or difficulty swallowing  ----------------------------------------------------------------------------------------- Wound Check: Patient presents for wound check. Patient has a ulceration due to skin fold wound which is located on the abdomen. Current symptoms: wound healing as expected. Symptoms began a few months ago. Pain is rated 0/10. Interventions to date: completed antibiotic, continue bactroban and nystatin powder.   Depression anxiety & : Patient complains of depression. She complains of depressed  mood. Onset was approximately 2 years ago, stable since that time.  She denies current suicidal and homicidal plan or intent.   Family history significant for no psychiatric illness.Possible organic causes contributing are: none.  Risk factors: previous episode of depression Previous treatment includes Celexa . She complains of the following side effects from the treatment: none.  ROS   Observations/Objective: Televisit patient did not sound to be in distress.  Assessment and Plan: Gastroesophageal reflux disease without esophagitis GERD well-controlled on Protonix 20 mg tablet daily.  Rx refill sent to pharmacy.  Continue healthy diet, weight loss.  Wound check, abscess Wound healing as expected.  Continue nystatin powder and Bactroban twice daily.  On lower abdomen.  Rx refill sent to pharmacy  Moderate single current episode of major depressive disorder ([place]) Depression symptoms well controlled on current medication.  No changes needed.  Completed, PHQ-9.    Anxiety Symptoms well managed on current medication no changes necessary.  Completed GAD-7.  Follow Up Instructions: Follow-up with worsening or unresolved symptoms    I discussed the assessment and treatment plan with the patient. The patient was provided an opportunity to ask questions and all were answered. The patient agreed with the plan and demonstrated an understanding of the instructions.   The patient was advised to call back or seek an in-person evaluation if the symptoms worsen or if the condition fails to improve as anticipated.  The above assessment and management plan was discussed with the patient. The patient verbalized understanding of and has agreed to the management plan. Patient is aware to call the clinic if symptoms persist or worsen. Patient is aware when to return to the clinic for a follow-up visit. Patient educated on when it is  appropriate to go to the emergency department.  GAD 7 : Generalized  Anxiety Score 08/21/2020 07/11/2020  Nervous, Anxious, on Edge 0 2  Control/stop worrying 0 1  Worry too much - different things 2 1  Trouble relaxing 0 2  Restless 0 1  Easily annoyed or irritable 0 0  Afraid - awful might happen 0 0  Total GAD 7 Score 2 7  Anxiety Difficulty Not difficult at all Not difficult at all   [facility] Visit from 08/21/2020 in [place]  PHQ-9 Total Score 1       Time call ended: 9:28 AM  I provided 15 minutes of non-face-to-face time during this encounter.    [name], NP

## 2020-08-21 NOTE — Assessment & Plan Note (Signed)
Symptoms well managed on current medication no changes necessary.  Completed GAD-7.

## 2020-08-21 NOTE — Assessment & Plan Note (Addendum)
Depression symptoms well controlled on current medication.  No changes needed.  Completed, PHQ-9.

## 2020-08-22 ENCOUNTER — Encounter: Payer: Self-pay | Admitting: *Deleted

## 2020-08-22 ENCOUNTER — Inpatient Hospital Stay: Payer: BC Managed Care – PPO | Attending: Hematology and Oncology | Admitting: Hematology and Oncology

## 2020-08-22 ENCOUNTER — Telehealth: Payer: Self-pay | Admitting: Hematology and Oncology

## 2020-08-22 DIAGNOSIS — C50111 Malignant neoplasm of central portion of right female breast: Secondary | ICD-10-CM | POA: Insufficient documentation

## 2020-08-22 DIAGNOSIS — Z170 Estrogen receptor positive status [ER+]: Secondary | ICD-10-CM | POA: Insufficient documentation

## 2020-08-22 DIAGNOSIS — C773 Secondary and unspecified malignant neoplasm of axilla and upper limb lymph nodes: Secondary | ICD-10-CM | POA: Insufficient documentation

## 2020-08-23 ENCOUNTER — Other Ambulatory Visit (HOSPITAL_COMMUNITY): Payer: Self-pay | Admitting: Surgery

## 2020-08-23 DIAGNOSIS — C50019 Malignant neoplasm of nipple and areola, unspecified female breast: Secondary | ICD-10-CM

## 2020-08-23 NOTE — Telephone Encounter (Signed)
PA denied for protonix patient aware and she is going to pay for it.

## 2020-08-24 ENCOUNTER — Encounter: Payer: Self-pay | Admitting: *Deleted

## 2020-08-28 ENCOUNTER — Other Ambulatory Visit: Payer: Self-pay | Admitting: Student

## 2020-08-28 ENCOUNTER — Other Ambulatory Visit: Payer: Self-pay | Admitting: Radiology

## 2020-08-29 ENCOUNTER — Ambulatory Visit (HOSPITAL_COMMUNITY)
Admission: RE | Admit: 2020-08-29 | Discharge: 2020-08-29 | Disposition: A | Discharge status: Home / Self Care | Payer: BC Managed Care – PPO | Source: Ambulatory Visit | Attending: Surgery | Admitting: Surgery

## 2020-08-29 ENCOUNTER — Other Ambulatory Visit (HOSPITAL_COMMUNITY): Payer: Self-pay | Admitting: Surgery

## 2020-08-29 ENCOUNTER — Encounter (HOSPITAL_COMMUNITY): Payer: Self-pay

## 2020-08-29 ENCOUNTER — Other Ambulatory Visit: Payer: Self-pay

## 2020-08-29 DIAGNOSIS — Z7984 Long term (current) use of oral hypoglycemic drugs: Secondary | ICD-10-CM | POA: Insufficient documentation

## 2020-08-29 DIAGNOSIS — Z885 Allergy status to narcotic agent status: Secondary | ICD-10-CM | POA: Diagnosis not present

## 2020-08-29 DIAGNOSIS — Z79899 Other long term (current) drug therapy: Secondary | ICD-10-CM | POA: Diagnosis not present

## 2020-08-29 DIAGNOSIS — C50111 Malignant neoplasm of central portion of right female breast: Secondary | ICD-10-CM | POA: Diagnosis not present

## 2020-08-29 DIAGNOSIS — C50019 Malignant neoplasm of nipple and areola, unspecified female breast: Secondary | ICD-10-CM

## 2020-08-29 DIAGNOSIS — E119 Type 2 diabetes mellitus without complications: Secondary | ICD-10-CM | POA: Insufficient documentation

## 2020-08-29 DIAGNOSIS — C50911 Malignant neoplasm of unspecified site of right female breast: Secondary | ICD-10-CM | POA: Diagnosis not present

## 2020-08-29 DIAGNOSIS — E669 Obesity, unspecified: Secondary | ICD-10-CM | POA: Insufficient documentation

## 2020-08-29 DIAGNOSIS — Z452 Encounter for adjustment and management of vascular access device: Secondary | ICD-10-CM | POA: Diagnosis not present

## 2020-08-29 HISTORY — PX: IR IMAGING GUIDED PORT INSERTION: IMG5740

## 2020-08-29 LAB — CBC WITH DIFFERENTIAL/PLATELET
Abs Immature Granulocytes: 0.03 10*3/uL (ref 0.00–0.07)
Basophils Absolute: 0 10*3/uL (ref 0.0–0.1)
Basophils Relative: 0 %
Eosinophils Absolute: 0.1 10*3/uL (ref 0.0–0.5)
Eosinophils Relative: 2 %
HCT: 42.6 % (ref 36.0–46.0)
Hemoglobin: 14.3 g/dL (ref 12.0–15.0)
Immature Granulocytes: 0 %
Lymphocytes Relative: 26 %
Lymphs Abs: 1.9 10*3/uL (ref 0.7–4.0)
MCH: 29.9 pg (ref 26.0–34.0)
MCHC: 33.6 g/dL (ref 30.0–36.0)
MCV: 89.1 fL (ref 80.0–100.0)
Monocytes Absolute: 0.4 10*3/uL (ref 0.1–1.0)
Monocytes Relative: 5 %
Neutro Abs: 4.9 10*3/uL (ref 1.7–7.7)
Neutrophils Relative %: 67 %
Platelets: 358 10*3/uL (ref 150–400)
RBC: 4.78 MIL/uL (ref 3.87–5.11)
RDW: 13.3 % (ref 11.5–15.5)
WBC: 7.3 10*3/uL (ref 4.0–10.5)
nRBC: 0 % (ref 0.0–0.2)

## 2020-08-29 LAB — PROTIME-INR
INR: 1 (ref 0.8–1.2)
Prothrombin Time: 12.6 seconds (ref 11.4–15.2)

## 2020-08-29 LAB — GLUCOSE, CAPILLARY: Glucose-Capillary: 121 mg/dL — ABNORMAL HIGH (ref 70–99)

## 2020-08-29 MED ORDER — MIDAZOLAM HCL 2 MG/2ML IJ SOLN
INTRAMUSCULAR | Status: AC
  Filled 2020-08-29: qty 4

## 2020-08-29 MED ORDER — HEPARIN SOD (PORK) LOCK FLUSH 100 UNIT/ML IV SOLN
INTRAVENOUS | Status: AC
  Filled 2020-08-29: qty 5

## 2020-08-29 MED ORDER — FENTANYL CITRATE (PF) 100 MCG/2ML IJ SOLN
INTRAMUSCULAR | Status: AC
  Filled 2020-08-29: qty 2

## 2020-08-29 MED ORDER — MIDAZOLAM HCL 2 MG/2ML IJ SOLN
INTRAMUSCULAR | Status: AC
  Filled 2020-08-29: qty 2

## 2020-08-29 MED ORDER — LIDOCAINE HCL (PF) 1 % IJ SOLN
INTRAMUSCULAR | Status: AC | PRN
  Administered 2020-08-29 (×2): 10 mL via INTRADERMAL

## 2020-08-29 MED ORDER — SODIUM CHLORIDE 0.9 % IV SOLN: INTRAVENOUS | Status: DC

## 2020-08-29 MED ORDER — FENTANYL CITRATE (PF) 100 MCG/2ML IJ SOLN
INTRAMUSCULAR | Status: AC | PRN
  Administered 2020-08-29: 50 ug via INTRAVENOUS
  Administered 2020-08-29 (×2): 25 ug via INTRAVENOUS
  Administered 2020-08-29 (×4): 50 ug via INTRAVENOUS

## 2020-08-29 MED ORDER — LIDOCAINE-EPINEPHRINE 1 %-1:100000 IJ SOLN
INTRAMUSCULAR | Status: AC
  Filled 2020-08-29: qty 1

## 2020-08-29 MED ORDER — LIDOCAINE HCL 1 % IJ SOLN
INTRAMUSCULAR | Status: AC
  Filled 2020-08-29: qty 20

## 2020-08-29 MED ORDER — HEPARIN SOD (PORK) LOCK FLUSH 100 UNIT/ML IV SOLN
INTRAVENOUS | Status: AC | PRN
  Administered 2020-08-29: 500 [IU] via INTRAVENOUS

## 2020-08-29 MED ORDER — MIDAZOLAM HCL 2 MG/2ML IJ SOLN
INTRAMUSCULAR | Status: AC | PRN
Start: 2020-08-29 — End: 2020-08-29
  Administered 2020-08-29: 0.5 mg via INTRAVENOUS
  Administered 2020-08-29 (×2): 1 mg via INTRAVENOUS
  Administered 2020-08-29: 0.5 mg via INTRAVENOUS
  Administered 2020-08-29: 2 mg via INTRAVENOUS
  Administered 2020-08-29 (×3): 0.5 mg via INTRAVENOUS
  Administered 2020-08-29 (×2): 1 mg via INTRAVENOUS
  Administered 2020-08-29: 0.5 mg via INTRAVENOUS
  Administered 2020-08-29: 1 mg via INTRAVENOUS

## 2020-08-29 MED ORDER — CEFAZOLIN SODIUM-DEXTROSE 2-4 GM/100ML-% IV SOLN
INTRAVENOUS | Status: AC
  Filled 2020-08-29: qty 100

## 2020-08-29 MED ORDER — FENTANYL CITRATE (PF) 100 MCG/2ML IJ SOLN
INTRAMUSCULAR | Status: AC
  Filled 2020-08-29: qty 4

## 2020-08-29 MED ORDER — LIDOCAINE-EPINEPHRINE 1 %-1:100000 IJ SOLN
INTRAMUSCULAR | Status: AC | PRN
  Administered 2020-08-29 (×3): 10 mL via INTRADERMAL

## 2020-08-29 MED ORDER — CEFAZOLIN SODIUM-DEXTROSE 2-4 GM/100ML-% IV SOLN: 2.0000 g | Freq: Once | INTRAVENOUS | Status: DC

## 2020-08-29 NOTE — Consult Note (Signed)
Chief Complaint: Patient was seen in consultation today for port a cath placement  Referring Physician(s): Cornett,Thomas/Gudena,V  Supervising Physician: Markus Daft  Patient Status: [facility] - Out-pt  History of Present Illness: SHAWNAY [name] is a 50 y.o. female with PMH significant for DM, obesity and recently diagnosed right breast cancer, s/p right lumpectomy with LN dissection on 08/09/20. She has poor venous access and presents today for port a cath placement for chemotherapy.   Past Medical History:  Diagnosis Date  . Cancer ([place])    right breast  . Complication of anesthesia    hard to wake up  per pt  . Diabetes mellitus without complication ([place])   . Dyspnea   . Heart palpitations   . Obesity     Past Surgical History:  Procedure Laterality Date  . BREAST LUMPECTOMY WITH RADIOACTIVE SEED AND SENTINEL LYMPH NODE BIOPSY Right 08/09/2020   Procedure: RIGHT BREAST LUMPECTOMY WITH RADIOACTIVE SEED AND RIGHT BREAST RADIOACTIVE SEED TARGETED SENTINEL LYMPH NODE BIOPSY, RIGHT BREAST SENTINEL LYMPH NODE MAPPING;  Surgeon: Erroll Luna, MD;  Location: [place];  Service: General;  Laterality: Right;  PEC BLOCK  . CHOLECYSTECTOMY    . HERNIA REPAIR    . lumpectomy right breast      Allergies: Codeine and Peach flavor  Medications: Prior to Admission medications   Medication Sig Start Date End Date Taking? Authorizing Provider  buPROPion (WELLBUTRIN XL) 150 MG 24 hr tablet Take 1 tablet (150 mg total) by mouth daily. 07/11/20  Yes [name], NP  cephALEXin (KEFLEX) 500 MG capsule Take 1 capsule (500 mg total) by mouth 2 (two) times daily. 08/04/20  Yes Hawks, Christy A, FNP  cetirizine (ZYRTEC) 10 MG tablet Take 1 tablet (10 mg total) by mouth daily. 07/11/20  Yes [name], NP  fluconazole (DIFLUCAN) 150 MG tablet Take 1 tablet (150 mg total) by mouth every three (3) days as needed. 08/04/20  Yes Hawks, Christy A, FNP  ibuprofen (ADVIL) 800 MG tablet Take 1 tablet (800  mg total) by mouth every 8 (eight) hours as needed. 08/09/20  Yes Cornett, Marcello Moores, MD  loratadine (CLARITIN) 10 MG tablet Take 1 tablet (10 mg total) by mouth daily. 01/14/20  Yes Dettinger, Fransisca Kaufmann, MD  metFORMIN (GLUCOPHAGE) 500 MG tablet Take 1 tablet (500 mg total) by mouth 2 (two) times daily with a meal. 07/17/20  Yes [name], NP  metoprolol tartrate (LOPRESSOR) 50 MG tablet Take 1 tablet (50 mg total) by mouth 2 (two) times daily. 07/11/20  Yes [name], NP  mupirocin cream (BACTROBAN) 2 % Apply 1 application topically 2 (two) times daily. 07/11/20  Yes [name], NP  nystatin (MYCOSTATIN/NYSTOP) powder Apply 1 application topically 2 (two) times daily. 08/21/20  Yes [name], NP  pantoprazole (PROTONIX) 40 MG tablet Take 1 tablet (40 mg total) by mouth daily. (Needs to be seen before next refill) 08/21/20  Yes [name], NP  ALPRAZolam Duanne Moron) 0.5 MG tablet Take 1 tablet (0.5 mg total) by mouth at bedtime as needed for anxiety. 09/01/18   Terald Sleeper, PA-C  citalopram (CELEXA) 20 MG tablet Take 1 tablet (20 mg total) by mouth daily. (Needs to be seen before next refill) Patient not taking: Reported on 07/31/2020 05/05/19   Terald Sleeper, PA-C  tolterodine (DETROL LA) 2 MG 24 hr capsule Take 1 capsule (2 mg total) by mouth daily. 07/11/20   [name], NP  traMADol (  ULTRAM) 50 MG tablet Take 1 tablet (50 mg total) by mouth every 6 (six) hours as needed. 08/09/20 08/09/21  Erroll Luna, MD     Family History  Adopted: Yes    Social History   Socioeconomic History  . Marital status: Married    Spouse name: Not on file  . Number of children: Not on file  . Years of education: Not on file  . Highest education level: Not on file  Occupational History  . Not on file  Tobacco Use  . Smoking status: Never Smoker  . Smokeless tobacco: Never Used  Vaping Use  . Vaping Use: Never used  Substance and Sexual Activity  . Alcohol use: No  . Drug use: No   . Sexual activity: Yes    Partners: Male    Birth control/protection: Pill  Other Topics Concern  . Not on file  Social History Narrative  . Not on file   Social Determinants of Health   Financial Resource Strain: Not on file  Food Insecurity: Not on file  Transportation Needs: Not on file  Physical Activity: Not on file  Stress: Not on file  Social Connections: Not on file      Review of Systems denies fever, HA, chest pain, dyspnea, cough, abd/back pain,N/V or bleeding; she is anxious  Vital Signs:pending    Physical Exam awake/alert; chest- CTA bilat; heart- RRR; abd- obese, soft,+BS,NT; ext- FROM; pt does have drainage of brown fluid from rt breast surgical site adjacent to nipple  Imaging: NM Sentinel Node Inj-No Rpt (Breast)  Result Date: 08/09/2020 Sulfur colloid was injected by the nuclear medicine technologist for melanoma sentinel node.   MM Breast Surgical Specimen  Result Date: 08/09/2020 CLINICAL DATA:  Evaluate specimen EXAM: SPECIMEN RADIOGRAPH OF THE RIGHT BREAST COMPARISON:  Previous exam(s). FINDINGS: Status post excision of the right breast. The radioactive seed and biopsy marker clip are present, completely intact, and were marked for pathology. IMPRESSION: Specimen radiograph of the right breast. Electronically Signed   By: Dorise Bullion III M.D   On: 08/09/2020 13:29   MM Breast Surgical Specimen  Result Date: 08/09/2020 CLINICAL DATA:  Evaluate specimen mammogram following surgical excision of a right breast lesion after radioactive seed localization. EXAM: SPECIMEN RADIOGRAPH OF THE RIGHT BREAST COMPARISON:  Previous exam(s). FINDINGS: Status post excision of the right breast. The radioactive seed and 2 biopsy clips are present within the specimen, completely intact, and were marked for pathology. IMPRESSION: Specimen radiograph of the right breast. Electronically Signed   By: Lajean Manes M.D.   On: 08/09/2020 12:38   [place] RT RADIOACTIVE SEED  LOC  Result Date: 08/09/2020 CLINICAL DATA:  Localization of a right retroareolar breast mass and a right axillary lymph node. EXAM: ULTRASOUND GUIDED RADIOACTIVE SEED LOCALIZATION OF THE RIGHT BREAST COMPARISON:  Previous exam(s). FINDINGS: Patient presents for radioactive seed localization prior to surgery. I met with the patient and we discussed the procedure of seed localization including benefits and alternatives. We discussed the high likelihood of a successful procedure. We discussed the risks of the procedure including infection, bleeding, tissue injury and further surgery. We discussed the low dose of radioactivity involved in the procedure. Informed, written consent was given. The usual time-out protocol was performed immediately prior to the procedure. Using ultrasound guidance, sterile technique, 1% lidocaine and an I-125 radioactive seed, the right retroareolar breast mass was localized using a lateral approach. The follow-up mammogram images confirm the seed in the expected location and were  marked for the surgeon. Follow-up survey of the patient confirms presence of the radioactive seed. Order number of I-125 seed:  [id number]. Total activity:  7.001 millicuries reference Date: July 20, 2020 The patient tolerated the procedure well and was released from the [place]. She was given instructions regarding seed removal. Using ultrasound guidance, sterile technique, 1% lidocaine and an I-125 radioactive seed, the right axillary lymph node was localized using a lateral approach. The follow-up mammogram images confirm the seed in the expected location and were marked for the surgeon. Follow-up survey of the patient confirms presence of the radioactive seed. Order number of I-125 seed:  [id number]. Total activity:  9.163 millicuries reference Date: July 20, 2020 The patient tolerated the procedure well and was released from the [place]. She was given instructions regarding seed removal.  IMPRESSION: Radioactive seed localization of the right retroareolar mass in the right axillary lymph node breast. No apparent complications. Electronically Signed   By: Dorise Bullion III M.D   On: 08/09/2020 09:18   [place] RT RADIOACTIVE SEED EA ADD LESION  Result Date: 08/09/2020 CLINICAL DATA:  Localization of a right retroareolar breast mass and a right axillary lymph node. EXAM: ULTRASOUND GUIDED RADIOACTIVE SEED LOCALIZATION OF THE RIGHT BREAST COMPARISON:  Previous exam(s). FINDINGS: Patient presents for radioactive seed localization prior to surgery. I met with the patient and we discussed the procedure of seed localization including benefits and alternatives. We discussed the high likelihood of a successful procedure. We discussed the risks of the procedure including infection, bleeding, tissue injury and further surgery. We discussed the low dose of radioactivity involved in the procedure. Informed, written consent was given. The usual time-out protocol was performed immediately prior to the procedure. Using ultrasound guidance, sterile technique, 1% lidocaine and an I-125 radioactive seed, the right retroareolar breast mass was localized using a lateral approach. The follow-up mammogram images confirm the seed in the expected location and were marked for the surgeon. Follow-up survey of the patient confirms presence of the radioactive seed. Order number of I-125 seed:  [id number]. Total activity:  7.017 millicuries reference Date: July 20, 2020 The patient tolerated the procedure well and was released from the [place]. She was given instructions regarding seed removal. Using ultrasound guidance, sterile technique, 1% lidocaine and an I-125 radioactive seed, the right axillary lymph node was localized using a lateral approach. The follow-up mammogram images confirm the seed in the expected location and were marked for the surgeon. Follow-up survey of the patient confirms presence of the radioactive  seed. Order number of I-125 seed:  [id number]. Total activity:  2.330 millicuries reference Date: July 20, 2020 The patient tolerated the procedure well and was released from the [place]. She was given instructions regarding seed removal. IMPRESSION: Radioactive seed localization of the right retroareolar mass in the right axillary lymph node breast. No apparent complications. Electronically Signed   By: Dorise Bullion III M.D   On: 08/09/2020 09:18   MM CLIP PLACEMENT RIGHT  Result Date: 08/09/2020 CLINICAL DATA:  Localization of a right retroareolar breast mass and a right axillary lymph node. EXAM: ULTRASOUND GUIDED RADIOACTIVE SEED LOCALIZATION OF THE RIGHT BREAST COMPARISON:  Previous exam(s). FINDINGS: Patient presents for radioactive seed localization prior to surgery. I met with the patient and we discussed the procedure of seed localization including benefits and alternatives. We discussed the high likelihood of a successful procedure. We discussed the risks of the procedure including infection, bleeding, tissue injury and further surgery. We discussed  the low dose of radioactivity involved in the procedure. Informed, written consent was given. The usual time-out protocol was performed immediately prior to the procedure. Using ultrasound guidance, sterile technique, 1% lidocaine and an I-125 radioactive seed, the right retroareolar breast mass was localized using a lateral approach. The follow-up mammogram images confirm the seed in the expected location and were marked for the surgeon. Follow-up survey of the patient confirms presence of the radioactive seed. Order number of I-125 seed:  [id number]. Total activity:  3.536 millicuries reference Date: July 20, 2020 The patient tolerated the procedure well and was released from the [place]. She was given instructions regarding seed removal. Using ultrasound guidance, sterile technique, 1% lidocaine and an I-125 radioactive seed, the right  axillary lymph node was localized using a lateral approach. The follow-up mammogram images confirm the seed in the expected location and were marked for the surgeon. Follow-up survey of the patient confirms presence of the radioactive seed. Order number of I-125 seed:  [id number]. Total activity:  8.676 millicuries reference Date: July 20, 2020 The patient tolerated the procedure well and was released from the [place]. She was given instructions regarding seed removal. IMPRESSION: Radioactive seed localization of the right retroareolar mass in the right axillary lymph node breast. No apparent complications. Electronically Signed   By: Dorise Bullion III M.D   On: 08/09/2020 09:18   MM CLIP PLACEMENT RIGHT  Result Date: 08/04/2020 CLINICAL DATA:  Post biopsy mammogram of the right breast for clip placement. EXAM: DIAGNOSTIC RIGHT MAMMOGRAM POST ULTRASOUND BIOPSY COMPARISON:  Previous exam(s). FINDINGS: Mammographic images were obtained following ultrasound guided biopsy of hypoechoic tissue in the retroareolar right breast. The biopsy marking clip is in expected position at the site of biopsy. The wing shaped biopsy marking clip is 2.2 cm anterior to the wing shaped biopsy marking clip placed within the known cancer. IMPRESSION: Appropriate positioning of the heart shaped biopsy marking clip at the site of biopsy in the retroareolar right breast. Final Assessment: Post Procedure Mammograms for Marker Placement Electronically Signed   By: Ammie Ferrier M.D.   On: 08/04/2020 10:47   [place] RT BREAST BX W LOC DEV 1ST LESION IMG BX SPEC US GUIDE  Addendum Date: 08/08/2020   ADDENDUM REPORT: 08/08/2020 14:25 ADDENDUM: Pathology revealed INTRADUCTAL PAPILLOMA (EDGE OF), ATYPICAL LOBULAR HYPERPLASIA (FOCAL) of the RIGHT breast 4:00 o'clock. This was found to be concordant by Dr. Ammie Ferrier. Surgical excision recommended. Pathology results were discussed with the patient by telephone. The patient  reported doing well after the biopsy with tenderness at the site. Post biopsy instructions and care were reviewed and questions were answered. The patient was encouraged to call [facility] for any additional concerns. Also, RIGHT nipple showed enhancement on the patient's MRI which is concerning for involvement of the nipple with cancer, and surgical biopsy is suggested. Dr. Brantley Stage was notified this morning via EPIC message of these results. Pathology results reported by Stacie Acres RN on 08/08/2020. Electronically Signed   By: Ammie Ferrier M.D.   On: 08/08/2020 14:25   Result Date: 08/08/2020 CLINICAL DATA:  50 year old female presenting for ultrasound-guided biopsy of the retroareolar right breast. EXAM: ULTRASOUND GUIDED RIGHT BREAST CORE NEEDLE BIOPSY COMPARISON:  Previous exam(s). PROCEDURE: I met with the patient and we discussed the procedure of ultrasound-guided biopsy, including benefits and alternatives. We discussed the high likelihood of a successful procedure. We discussed the risks of the procedure, including infection, bleeding, tissue injury, clip  migration, and inadequate sampling. Informed written consent was given. The usual time-out protocol was performed immediately prior to the procedure. Lesion quadrant: Lower inner quadrant Using sterile technique and 1% Lidocaine as local anesthetic, under direct ultrasound visualization, a 14 gauge spring-loaded device was used to perform biopsy of hypoechoic tissue and dilated ducts in the retroareolar right breast using an inferior approach. At the conclusion of the procedure a heart shaped tissue marker clip was deployed into the biopsy cavity. Follow up 2 view mammogram was performed and dictated separately. IMPRESSION: Ultrasound guided biopsy of hypoechoic tissue and dilated ducts in the retroareolar right breast. No apparent complications. Electronically Signed: By: Ammie Ferrier M.D. On: 08/04/2020 10:43     Labs:  CBC: Recent Labs    07/11/20 1232 08/03/20 1025  WBC 10.1 8.7  HGB 16.0* 15.4*  HCT 47.0* 45.1  PLT 334 285    COAGS: No results for input(s): INR, APTT in the last 8760 hours.  BMP: Recent Labs    07/11/20 1232 08/03/20 1025  NA 145* 138  K 4.5 3.9  CL 103 103  CO2 20 24  GLUCOSE 113* 115*  BUN 11 12  CALCIUM 9.1 9.1  CREATININE 0.75 0.77  GFRNONAA 93 >60  GFRAA 107  --     LIVER FUNCTION TESTS: Recent Labs    07/11/20 1232 08/03/20 1025  BILITOT 0.4 0.8  AST 26 23  ALT 42* 41  ALKPHOS 112 77  PROT 7.4 7.1  ALBUMIN 4.6 3.7    TUMOR MARKERS: No results for input(s): AFPTM, CEA, CA199, CHROMGRNA in the last 8760 hours.  Assessment and Plan: 50 y.o. female with PMH significant for DM, obesity and recently diagnosed right breast cancer, s/p right lumpectomy with LN dissection on 08/09/20. She has poor venous access and presents today for port a cath placement for chemotherapy.Risks and benefits of image guided port-a-catheter placement was discussed with the patient including, but not limited to bleeding, infection, pneumothorax, or fibrin sheath development and need for additional procedures.  All of the patient's questions were answered, patient is agreeable to proceed. Consent signed and in chart.  Pt will receive IV Ancef preprocedure secondary to fluid drainage from surgical site rt breast/lateral nipple region   Thank you for this interesting consult.  I greatly enjoyed meeting ZARRIA TOWELL and look forward to participating in their care.  A copy of this report was sent to the requesting provider on this date.  Electronically Signed: D. Rowe Robert, PA-C 08/29/2020, 8:51 AM   I spent a total of 25 minutes  in face to face in clinical consultation, greater than 50% of which was counseling/coordinating care for port  a cath placement

## 2020-08-29 NOTE — Sedation Documentation (Signed)
Right chest port a cath placement procedure begun.

## 2020-08-29 NOTE — Procedures (Signed)
Interventional Radiology Procedure:   Indications: Right breast cancer  Procedure: Port placement in right chest.  Aborted left chest port  Findings: Right jugular port, tip at SVC/RA junction.  Left jugular port was aborted and removed because of difficult anatomy and catheter retracted after placement.  Complications: None     EBL: Minimal, less than 25 ml  Plan: Discharge in one hour.  Keep port site and incisions dry for at least 24 hours.     Karel Turpen R. Anselm Pancoast, MD  Pager: [phone]

## 2020-08-29 NOTE — Discharge Instructions (Signed)
Urgent needs - Interventional Radiology on call MD [phone] ° °Wound - May remove dressing and shower in 24 to 48 hours.  Keep site clean and dry.  Replace with bandaid as needed.  Do not submerge in tub or water until site healing well. If closed with glue, glue will flake off on its own. ° °If ordered by your provider, may start Emla cream in 2 weeks or after incision is healed. ° °After completion of treatment, your provider should have you set up for monthly port flushes.  ° ° °Implanted Port Insertion, Care After °This sheet gives you information about how to care for yourself after your procedure. Your health care provider may also give you more specific instructions. If you have problems or questions, contact your health care provider. °What can I expect after the procedure? °After the procedure, it is common to have: °· Discomfort at the port insertion site. °· Bruising on the skin over the port. This should improve over 3-4 days. °Follow these instructions at home: °Port care °· After your port is placed, you will get a manufacturer's information card. The card has information about your port. Keep this card with you at all times. °· Take care of the port as told by your health care provider. Ask your health care provider if you or a family member can get training for taking care of the port at home. A home health care nurse may also take care of the port. °· Make sure to remember what type of port you have. °Incision care °· Follow instructions from your health care provider about how to take care of your port insertion site. Make sure you: °? Wash your hands with soap and water before and after you change your bandage (dressing). If soap and water are not available, use hand sanitizer. °? Change your dressing as told by your health care provider. °? Leave stitches (sutures), skin glue, or adhesive strips in place. These skin closures may need to stay in place for 2 weeks or longer. If adhesive strip  edges start to loosen and curl up, you may trim the loose edges. Do not remove adhesive strips completely unless your health care provider tells you to do that. °· Check your port insertion site every day for signs of infection. Check for: °? Redness, swelling, or pain. °? Fluid or blood. °? Warmth. °? Pus or a bad smell.  °  °  °Activity °· Return to your normal activities as told by your health care provider. Ask your health care provider what activities are safe for you. °· Do not lift anything that is heavier than 10 lb (4.5 kg), or the limit that you are told, until your health care provider says that it is safe. °General instructions °· Take over-the-counter and prescription medicines only as told by your health care provider. °· Do not take baths, swim, or use a hot tub until your health care provider approves. Ask your health care provider if you may take showers. You may only be allowed to take sponge baths. °· Do not drive for 24 hours if you were given a sedative during your procedure. °· Wear a medical alert bracelet in case of an emergency. This will tell any health care providers that you have a port. °· Keep all follow-up visits as told by your health care provider. This is important. °Contact a health care provider if: °· You cannot flush your port with saline as directed, or you cannot draw blood   from the port. °· You have a fever or chills. °· You have redness, swelling, or pain around your port insertion site. °· You have fluid or blood coming from your port insertion site. °· Your port insertion site feels warm to the touch. °· You have pus or a bad smell coming from the port insertion site. °Get help right away if: °· You have chest pain or shortness of breath. °· You have bleeding from your port that you cannot control. °Summary °· Take care of the port as told by your health care provider. Keep the manufacturer's information card with you at all times. °· Change your dressing as told by your  health care provider. °· Contact a health care provider if you have a fever or chills or if you have redness, swelling, or pain around your port insertion site. °· Keep all follow-up visits as told by your health care provider. °This information is not intended to replace advice given to you by your health care provider. Make sure you discuss any questions you have with your health care provider. °Document Revised: 01/13/2018 Document Reviewed: 01/13/2018 °Elsevier Patient Education © 2021 [employer] ° ° °Moderate Conscious Sedation, Adult, Care After °This sheet gives you information about how to care for yourself after your procedure. Your health care provider may also give you more specific instructions. If you have problems or questions, contact your health care provider. °What can I expect after the procedure? °After the procedure, it is common to have: °· Sleepiness for several hours. °· Impaired judgment for several hours. °· Difficulty with balance. °· Vomiting if you eat too soon. °Follow these instructions at home: °For the time period you were told by your health care provider: °· Rest. °· Do not participate in activities where you could fall or become injured. °· Do not drive or use machinery. °· Do not drink alcohol. °· Do not take sleeping pills or medicines that cause drowsiness. °· Do not make important decisions or sign legal documents. °· Do not take care of children on your own.  °  °  °Eating and drinking °· Follow the diet recommended by your health care provider. °· Drink enough fluid to keep your urine pale yellow. °· If you vomit: °? Drink water, juice, or soup when you can drink without vomiting. °? Make sure you have little or no nausea before eating solid foods.   °General instructions °· Take over-the-counter and prescription medicines only as told by your health care provider. °· Have a responsible adult stay with you for the time you are told. It is important to have someone help  care for you until you are awake and alert. °· Do not smoke. °· Keep all follow-up visits as told by your health care provider. This is important. °Contact a health care provider if: °· You are still sleepy or having trouble with balance after 24 hours. °· You feel light-headed. °· You keep feeling nauseous or you keep vomiting. °· You develop a rash. °· You have a fever. °· You have redness or swelling around the IV site. °Get help right away if: °· You have trouble breathing. °· You have new-onset confusion at home. °Summary °· After the procedure, it is common to feel sleepy, have impaired judgment, or feel nauseous if you eat too soon. °· Rest after you get home. Know the things you should not do after the procedure. °· Follow the diet recommended by your health care provider and drink enough   fluid to keep your urine pale yellow. °· Get help right away if you have trouble breathing or new-onset confusion at home. °This information is not intended to replace advice given to you by your health care provider. Make sure you discuss any questions you have with your health care provider. °Document Revised: 10/15/2019 Document Reviewed: 05/13/2019 °Elsevier Patient Education © 2021 [employer] Inc[employer] ° ° °

## 2020-08-29 NOTE — Sedation Documentation (Signed)
Dr Anselm Pancoast unable to place port in left chest.Patient being prepped for right sided port a cath placement.

## 2020-08-30 ENCOUNTER — Encounter (HOSPITAL_COMMUNITY): Payer: Self-pay

## 2020-08-30 ENCOUNTER — Encounter (HOSPITAL_COMMUNITY)
Admission: RE | Admit: 2020-08-30 | Discharge: 2020-08-30 | Disposition: A | Discharge status: Home / Self Care | Payer: BC Managed Care – PPO | Source: Ambulatory Visit | Attending: Hematology and Oncology | Admitting: Hematology and Oncology

## 2020-08-30 ENCOUNTER — Ambulatory Visit (HOSPITAL_COMMUNITY)
Admission: RE | Admit: 2020-08-30 | Discharge: 2020-08-30 | Disposition: A | Discharge status: Home / Self Care | Payer: BC Managed Care – PPO | Source: Ambulatory Visit | Attending: Hematology and Oncology | Admitting: Hematology and Oncology

## 2020-08-30 DIAGNOSIS — Z170 Estrogen receptor positive status [ER+]: Secondary | ICD-10-CM | POA: Insufficient documentation

## 2020-08-30 DIAGNOSIS — C50111 Malignant neoplasm of central portion of right female breast: Secondary | ICD-10-CM | POA: Diagnosis not present

## 2020-08-30 DIAGNOSIS — Z452 Encounter for adjustment and management of vascular access device: Secondary | ICD-10-CM | POA: Diagnosis not present

## 2020-08-30 DIAGNOSIS — M19012 Primary osteoarthritis, left shoulder: Secondary | ICD-10-CM | POA: Diagnosis not present

## 2020-08-30 DIAGNOSIS — I700 Atherosclerosis of aorta: Secondary | ICD-10-CM | POA: Diagnosis not present

## 2020-08-30 DIAGNOSIS — C50919 Malignant neoplasm of unspecified site of unspecified female breast: Secondary | ICD-10-CM | POA: Diagnosis not present

## 2020-08-30 DIAGNOSIS — K760 Fatty (change of) liver, not elsewhere classified: Secondary | ICD-10-CM | POA: Diagnosis not present

## 2020-08-30 DIAGNOSIS — M170 Bilateral primary osteoarthritis of knee: Secondary | ICD-10-CM | POA: Diagnosis not present

## 2020-08-30 DIAGNOSIS — R600 Localized edema: Secondary | ICD-10-CM | POA: Diagnosis not present

## 2020-08-30 DIAGNOSIS — M19011 Primary osteoarthritis, right shoulder: Secondary | ICD-10-CM | POA: Diagnosis not present

## 2020-08-30 MED ORDER — IOHEXOL 9 MG/ML PO SOLN
ORAL | Status: AC
  Filled 2020-08-30: qty 1000

## 2020-08-30 MED ORDER — TECHNETIUM TC 99M MEDRONATE IV KIT
21.9000 | PACK | Freq: Once | INTRAVENOUS | Status: AC | PRN
  Administered 2020-08-30: 21.9 via INTRAVENOUS

## 2020-08-30 MED ORDER — IOHEXOL 300 MG/ML  SOLN
100.0000 mL | Freq: Once | INTRAMUSCULAR | Status: AC | PRN
  Administered 2020-08-30: 100 mL via INTRAVENOUS

## 2020-08-30 MED ORDER — IOHEXOL 9 MG/ML PO SOLN
1000.0000 mL | ORAL | Status: AC
  Administered 2020-08-30: 1000 mL via ORAL

## 2020-08-30 NOTE — Progress Notes (Signed)
Patient Care Team: [name], NP as PCP - General (Nurse Practitioner) Mauro Kaufmann, RN as Oncology Nurse Navigator Rockwell Germany, RN as Oncology Nurse Navigator  DIAGNOSIS:    ICD-10-CM   1. Malignant neoplasm of central portion of right breast in female, estrogen receptor positive ([place])  C50.111    Z17.0     SUMMARY OF ONCOLOGIC HISTORY: Oncology History  Malignant neoplasm of central portion of right breast in female, estrogen receptor positive ([place])  07/04/2020 Initial Diagnosis   Screening mammogram detected right breast mass and right axillary lymph nodes.  Right breast nipple retraction and retroareolar thickening.  2.4 cm mass with 2 abnormal lymph nodes.  Biopsy revealed invasive lobular cancer grade 1-2 ER 95%, PR 95%, Ki-67 30%, HER2 equivocal by IHC negative by FISH ratio 1.39   07/17/2020 Cancer Staging   Staging form: Breast, AJCC 8th Edition - Clinical stage from 07/17/2020: Stage IIA (cT2, cN1, cM0, G2, ER+, PR+, HER2-) - Signed by Nicholas Lose, MD on 07/17/2020   08/09/2020 Surgery   Rght lumpectomy ([facility]): invasive lobular carcinoma, 3.8cm, clear margins, 19/19 right axillary lymph nodes positive for metastatic carcinoma.    08/09/2020 Cancer Staging   Staging form: Breast, AJCC 8th Edition - Pathologic stage from 08/09/2020: Stage IIIA (pT2, pN3a, cM0, G2, ER+, PR+, HER2-) - Signed by Gardenia Phlegm, NP on 08/23/2020 Stage prefix: Initial diagnosis Histologic grading system: 3 grade system     CHIEF COMPLIANT: Cycle 1 Taxol Cytoxan  INTERVAL HISTORY: Shelby [name] is a 50 y.o. with above-mentioned history of right breast cancer who underwent a right lumpectomy and is currently on adjuvant chemotherapy with Taxol and Cytoxan. CT CAP on 08/30/20 showed no evidence of metastatic disease.  Postoperative changes.  Bone scan on 08/30/20 showed no evidence of bone metastases. She presents to the clinic today to review her scans and for treatment.     ALLERGIES:  is allergic to codeine and peach flavor.  MEDICATIONS:  Current Outpatient Medications  Medication Sig Dispense Refill  . ALPRAZolam (XANAX) 0.5 MG tablet Take 1 tablet (0.5 mg total) by mouth at bedtime as needed for anxiety. 40 tablet 0  . buPROPion (WELLBUTRIN XL) 150 MG 24 hr tablet Take 1 tablet (150 mg total) by mouth daily. 60 tablet 0  . cephALEXin (KEFLEX) 500 MG capsule Take 1 capsule (500 mg total) by mouth 2 (two) times daily. 14 capsule 0  . cetirizine (ZYRTEC) 10 MG tablet Take 1 tablet (10 mg total) by mouth daily. 30 tablet 7  . citalopram (CELEXA) 20 MG tablet Take 1 tablet (20 mg total) by mouth daily. (Needs to be seen before next refill) (Patient not taking: Reported on 07/31/2020) 30 tablet 11  . fluconazole (DIFLUCAN) 150 MG tablet Take 1 tablet (150 mg total) by mouth every three (3) days as needed. 3 tablet 0  . ibuprofen (ADVIL) 800 MG tablet Take 1 tablet (800 mg total) by mouth every 8 (eight) hours as needed. 30 tablet 0  . loratadine (CLARITIN) 10 MG tablet Take 1 tablet (10 mg total) by mouth daily. 30 tablet 2  . metFORMIN (GLUCOPHAGE) 500 MG tablet Take 1 tablet (500 mg total) by mouth 2 (two) times daily with a meal. 90 tablet 0  . metoprolol tartrate (LOPRESSOR) 50 MG tablet Take 1 tablet (50 mg total) by mouth 2 (two) times daily. 60 tablet 6  . mupirocin cream (BACTROBAN) 2 % Apply 1 application topically 2 (two) times daily. 15  g 0  . nystatin (MYCOSTATIN/NYSTOP) powder Apply 1 application topically 2 (two) times daily. 15 g 2  . pantoprazole (PROTONIX) 40 MG tablet Take 1 tablet (40 mg total) by mouth daily. (Needs to be seen before next refill) 30 tablet 2  . tolterodine (DETROL LA) 2 MG 24 hr capsule Take 1 capsule (2 mg total) by mouth daily. 60 capsule 1  . traMADol (ULTRAM) 50 MG tablet Take 1 tablet (50 mg total) by mouth every 6 (six) hours as needed. 20 tablet 0   No current facility-administered medications for this visit.     PHYSICAL EXAMINATION: ECOG PERFORMANCE STATUS: 1 - Symptomatic but completely ambulatory  Vitals:   08/31/20 1333  BP: 132/76  Pulse: 67  Resp: 18  Temp: 97.9 F (36.6 C)  SpO2: 99%   Filed Weights   08/31/20 1333  Weight: (!) 390 lb 6.4 oz (177.1 kg)    LABORATORY DATA:  I have reviewed the data as listed CMP Latest Ref Rng & Units 08/03/2020 07/11/2020  Glucose 70 - 99 mg/dL 929(G) 903(O)  BUN 6 - 20 mg/dL 12 11  Creatinine 1.49 - 1.00 mg/dL 9.69 2.49  Sodium 324 - 145 mmol/L 138 145(H)  Potassium 3.5 - 5.1 mmol/L 3.9 4.5  Chloride 98 - 111 mmol/L 103 103  CO2 22 - 32 mmol/L 24 20  Calcium 8.9 - 10.3 mg/dL 9.1 9.1  Total Protein 6.5 - 8.1 g/dL 7.1 7.4  Total Bilirubin 0.3 - 1.2 mg/dL 0.8 0.4  Alkaline Phos 38 - 126 U/L 77 112  AST 15 - 41 U/L 23 26  ALT 0 - 44 U/L 41 42(H)    Lab Results  Component Value Date   WBC 7.3 08/29/2020   HGB 14.3 08/29/2020   HCT 42.6 08/29/2020   MCV 89.1 08/29/2020   PLT 358 08/29/2020   NEUTROABS 4.9 08/29/2020    ASSESSMENT & PLAN:  Malignant neoplasm of central portion of right breast in female, estrogen receptor positive ([facility]) 07/04/2020:Screening mammogram detected right breast mass and right axillary lymph nodes. Right breast nipple retraction and retroareolar thickening. 2.4 cm mass with 2 abnormal lymph nodes. Biopsy revealed invasive lobular cancer grade 1-2 ER 95%, PR 95%, Ki-67 30%, HER2 equivocal by IHC negative by FISH ratio 1.39 T2N1 stageIIA  Right lumpectomy: 08/09/2020: ILC 3.8 cm, 19/19 LN Positive, Margins Neg  Plan: 1. Staging scans 2. Adjuvant chemo with TC X 6 (because of her propensity for diabetes she may only end up getting 4) 3. XRT 4. Adj Anti estrogen therapy (with Verzenio) ------------------------------------------------------------------------------------------------------------------------------------ 08/30/2020: CT CAP: No evidence of metastatic disease 08/30/2020: Bone scan: No evidence of  bone metastases Patient is extremely relieved to know that she does not have metastatic disease. There was a lot of difficulty in port placement and the had to place a port on the right side.  Plan to start chemotherapy in 1 to 2 weeks. Return to clinic in 1 week after starting chemo for toxicity check  No orders of the defined types were placed in this encounter.  The patient has a good understanding of the overall plan. she agrees with it. she will call with any problems that may develop before the next visit here.  Total time spent: 30 mins including face to face time and time spent for planning, charting and coordination of care  Sabas Sous, MD, MPH 08/31/2020  I, Kirt Boys Dorshimer, am acting as scribe for Dr. Serena Croissant.  I have reviewed the above documentation  for accuracy and completeness, and I agree with the above.

## 2020-08-31 ENCOUNTER — Other Ambulatory Visit: Payer: Self-pay

## 2020-08-31 ENCOUNTER — Encounter: Payer: Self-pay | Admitting: *Deleted

## 2020-08-31 ENCOUNTER — Inpatient Hospital Stay: Payer: BC Managed Care – PPO | Attending: Hematology and Oncology | Admitting: Hematology and Oncology

## 2020-08-31 DIAGNOSIS — C773 Secondary and unspecified malignant neoplasm of axilla and upper limb lymph nodes: Secondary | ICD-10-CM | POA: Diagnosis not present

## 2020-08-31 DIAGNOSIS — Z7952 Long term (current) use of systemic steroids: Secondary | ICD-10-CM | POA: Diagnosis not present

## 2020-08-31 DIAGNOSIS — Z170 Estrogen receptor positive status [ER+]: Secondary | ICD-10-CM | POA: Insufficient documentation

## 2020-08-31 DIAGNOSIS — C50111 Malignant neoplasm of central portion of right female breast: Secondary | ICD-10-CM | POA: Insufficient documentation

## 2020-08-31 DIAGNOSIS — Z5189 Encounter for other specified aftercare: Secondary | ICD-10-CM | POA: Insufficient documentation

## 2020-08-31 DIAGNOSIS — Z7984 Long term (current) use of oral hypoglycemic drugs: Secondary | ICD-10-CM | POA: Diagnosis not present

## 2020-08-31 DIAGNOSIS — Z79899 Other long term (current) drug therapy: Secondary | ICD-10-CM | POA: Diagnosis not present

## 2020-08-31 DIAGNOSIS — Z5111 Encounter for antineoplastic chemotherapy: Secondary | ICD-10-CM | POA: Diagnosis not present

## 2020-08-31 NOTE — Assessment & Plan Note (Signed)
07/04/2020:Screening mammogram detected right breast mass and right axillary lymph nodes. Right breast nipple retraction and retroareolar thickening. 2.4 cm mass with 2 abnormal lymph nodes. Biopsy revealed invasive lobular cancer grade 1-2 ER 95%, PR 95%, Ki-67 30%, HER2 equivocal by IHC negative by FISH ratio 1.39 T2N1 stageIIA  ILC 3.8 cm, 19/19 LN Positive, Margins Neg  Plan: 1. Staging scans 2. Adjuvant chemo with TC X 6 (because of her propensity for diabetes she may only end up getting 4) 3. XRT 4. Adj Anti estrogen therapy (with Verzenio) ------------------------------------------------------------------------------------------------------------------------------------ 08/30/2020: CT CAP: 08/30/2020: Bone scan:  Current treatment: Cycle 1 Taxotere Cytoxan Labs reviewed Chemo education completed, chemo consent obtained Antiemetics were reviewed  Return to clinic in 1 week for toxicity check

## 2020-09-04 ENCOUNTER — Telehealth: Payer: Self-pay | Admitting: Hematology and Oncology

## 2020-09-04 ENCOUNTER — Encounter: Payer: Self-pay | Admitting: *Deleted

## 2020-09-04 NOTE — Telephone Encounter (Signed)
Scheduled per 3/3 los. Called and spoke with pt, confirmed all added appts

## 2020-09-08 ENCOUNTER — Other Ambulatory Visit: Payer: Self-pay

## 2020-09-08 ENCOUNTER — Other Ambulatory Visit: Payer: Self-pay | Admitting: Hematology and Oncology

## 2020-09-08 ENCOUNTER — Inpatient Hospital Stay: Payer: BC Managed Care – PPO

## 2020-09-08 DIAGNOSIS — C50111 Malignant neoplasm of central portion of right female breast: Secondary | ICD-10-CM

## 2020-09-08 DIAGNOSIS — Z170 Estrogen receptor positive status [ER+]: Secondary | ICD-10-CM

## 2020-09-08 MED ORDER — ONDANSETRON HCL 8 MG PO TABS: 8.0000 mg | ORAL_TABLET | Freq: Two times a day (BID) | ORAL | 1 refills | Status: DC | PRN

## 2020-09-08 MED ORDER — PROCHLORPERAZINE MALEATE 10 MG PO TABS: 10.0000 mg | ORAL_TABLET | Freq: Four times a day (QID) | ORAL | 1 refills | Status: DC | PRN

## 2020-09-08 MED ORDER — DEXAMETHASONE 4 MG PO TABS: 8.0000 mg | ORAL_TABLET | Freq: Every day | ORAL | 0 refills | Status: DC

## 2020-09-08 MED ORDER — LIDOCAINE-PRILOCAINE 2.5-2.5 % EX CREA: TOPICAL_CREAM | CUTANEOUS | 3 refills | Status: DC

## 2020-09-08 NOTE — Progress Notes (Signed)
Pharmacist Chemotherapy Monitoring - Initial Assessment    Anticipated start date: 09/15/2020   Regimen:  . Are orders appropriate based on the patient's diagnosis, regimen, and cycle? Yes . Does the plan date match the patient's scheduled date? Yes . Is the sequencing of drugs appropriate? Yes . Are the premedications appropriate for the patient's regimen? Yes . Prior Authorization for treatment is: Pending o If applicable, is the correct biosimilar selected based on the patient's insurance? not applicable  Organ Function and Labs: Marland Kitchen Are dose adjustments needed based on the patient's renal function, hepatic function, or hematologic function? Yes . Are appropriate labs ordered prior to the start of patient's treatment? Yes . Other organ system assessment, if indicated: N/A . The following baseline labs, if indicated, have been ordered: N/A  Dose Assessment: . Are the drug doses appropriate? Yes . Are the following correct: o Drug concentrations Yes o IV fluid compatible with drug Yes o Administration routes Yes o Timing of therapy Yes . If applicable, does the patient have documented access for treatment and/or plans for port-a-cath placement? yes . If applicable, have lifetime cumulative doses been properly documented and assessed? not applicable Lifetime Dose Tracking  No doses have been documented on this patient for the following tracked chemicals: Doxorubicin, Epirubicin, Idarubicin, Daunorubicin, Mitoxantrone, Bleomycin, Oxaliplatin, Carboplatin, Liposomal Doxorubicin  o   Toxicity Monitoring/Prevention: . The patient has the following take home antiemetics prescribed: Ondansetron, Prochlorperazine and Dexamethasone . The patient has the following take home medications prescribed: N/A . Medication allergies and previous infusion related reactions, if applicable, have been reviewed and addressed. No . The patient's current medication list has been assessed for drug-drug  interactions with their chemotherapy regimen. no significant drug-drug interactions were identified on review.  Order Review: . Are the treatment plan orders signed? Yes . Is the patient scheduled to see a provider prior to their treatment? Yes  I verify that I have reviewed each item in the above checklist and answered each question accordingly.  Larene Beach, RPH, 09/08/2020  3:12 PM

## 2020-09-08 NOTE — Progress Notes (Signed)
START ON PATHWAY REGIMEN - Breast     A cycle is every 21 days:     Docetaxel      Cyclophosphamide   **Always confirm dose/schedule in your pharmacy ordering system**  Patient Characteristics: Postoperative without Neoadjuvant Therapy (Pathologic Staging), Invasive Disease, Adjuvant Therapy, HER2 Negative/Unknown/Equivocal, ER Positive, Node Positive, Node Positive (4+) Therapeutic Status: Postoperative without Neoadjuvant Therapy (Pathologic Staging) AJCC Grade: G2 AJCC N Category: pN3a AJCC M Category: cM0 ER Status: Positive (+) AJCC 8 Stage Grouping: IIIA HER2 Status: Negative (-) Oncotype Dx Recurrence Score: Not Appropriate AJCC T Category: pT2 PR Status: Positive (+) Adjuvant Therapy Status: No Adjuvant Therapy Received Yet or Changing Initial Adjuvant Regimen due to Tolerance Intent of Therapy: Curative Intent, Discussed with Patient

## 2020-09-11 ENCOUNTER — Ambulatory Visit (INDEPENDENT_AMBULATORY_CARE_PROVIDER_SITE_OTHER): Payer: BC Managed Care – PPO | Admitting: Nurse Practitioner

## 2020-09-11 ENCOUNTER — Encounter: Payer: Self-pay | Admitting: Nurse Practitioner

## 2020-09-11 ENCOUNTER — Other Ambulatory Visit: Payer: Self-pay | Admitting: Family Medicine

## 2020-09-11 DIAGNOSIS — R7303 Prediabetes: Secondary | ICD-10-CM

## 2020-09-11 DIAGNOSIS — J301 Allergic rhinitis due to pollen: Secondary | ICD-10-CM | POA: Diagnosis not present

## 2020-09-11 MED ORDER — LORATADINE 10 MG PO TABS: 10.0000 mg | ORAL_TABLET | Freq: Every day | ORAL | 11 refills | Status: DC

## 2020-09-11 MED ORDER — FLUTICASONE PROPIONATE 50 MCG/ACT NA SUSP: 2.0000 | Freq: Every day | NASAL | 6 refills | Status: DC

## 2020-09-11 NOTE — Progress Notes (Signed)
   Virtual Visit via telephone Note Due to COVID-19 pandemic this visit was conducted virtually. This visit type was conducted due to national recommendations for restrictions regarding the COVID-19 Pandemic (e.g. social distancing, sheltering in place) in an effort to limit this patient's exposure and mitigate transmission in our community. All issues noted in this document were discussed and addressed.  A physical exam was not performed with this format.  I connected with Shelby [name] on 09/11/20 at 1:58 by telephone and verified that I am speaking with the correct person using two identifiers. Shelby [name] is currently located at home and no one is currently with her during visit. The provider, Mary-Margaret Hassell Done, FNP is located in their office at time of visit.  I discussed the limitations, risks, security and privacy concerns of performing an evaluation and management service by telephone and the availability of in person appointments. I also discussed with the patient that there may be a patient responsible charge related to this service. The patient expressed understanding and agreed to proceed.   History and Present Illness:   Chief Complaint: Allergies   HPI Patient calls in stating that she has a scratchy throat, sneezing, watery eyes and runny nose. She is starting chemo Friday for breast cancer and she needs to correct this. Has made her tired. Eyes itching and watery  Review of Systems  HENT: Positive for congestion. Negative for ear discharge, ear pain and nosebleeds.   Respiratory: Negative.   Cardiovascular: Negative.   Genitourinary: Negative.   Neurological: Negative.   Psychiatric/Behavioral: Negative.   All other systems reviewed and are negative.    Observations/Objective: Alert and oriented- answers all questions appropriately No distress    Assessment and Plan: Shelby [name] in today with chief complaint of Allergies   1. Seasonal allergic rhinitis due  to pollen Avoid alergens - fluticasone (FLONASE) 50 MCG/ACT nasal spray; Place 2 sprays into both nostrils daily.  Dispense: 16 g; Refill: 6 - loratadine (CLARITIN) 10 MG tablet; Take 1 tablet (10 mg total) by mouth daily.  Dispense: 30 tablet; Refill: 11     Follow Up Instructions: prn    I discussed the assessment and treatment plan with the patient. The patient was provided an opportunity to ask questions and all were answered. The patient agreed with the plan and demonstrated an understanding of the instructions.   The patient was advised to call back or seek an in-person evaluation if the symptoms worsen or if the condition fails to improve as anticipated.  The above assessment and management plan was discussed with the patient. The patient verbalized understanding of and has agreed to the management plan. Patient is aware to call the clinic if symptoms persist or worsen. Patient is aware when to return to the clinic for a follow-up visit. Patient educated on when it is appropriate to go to the emergency department.   Time call ended:  2:10 I provided 12 minutes of non-face-to-face time during this encounter.    Mary-Margaret Hassell Done, FNP

## 2020-09-11 NOTE — Progress Notes (Signed)
The following biosimilar Udenyca ([id number]) has been selected for use in this patient.  Kennith Center, Pharm.D., CPP 09/11/2020@9 :31 AM

## 2020-09-14 ENCOUNTER — Ambulatory Visit: Payer: BC Managed Care – PPO

## 2020-09-14 ENCOUNTER — Ambulatory Visit: Payer: BC Managed Care – PPO | Admitting: Hematology and Oncology

## 2020-09-14 ENCOUNTER — Other Ambulatory Visit: Payer: BC Managed Care – PPO

## 2020-09-14 NOTE — Progress Notes (Signed)
Patient Care Team: [name], NP as PCP - General (Nurse Practitioner) Mauro Kaufmann, RN as Oncology Nurse Navigator Rockwell Germany, RN as Oncology Nurse Navigator  DIAGNOSIS:    ICD-10-CM   1. Malignant neoplasm of central portion of right breast in female, estrogen receptor positive ([place])  C50.111    Z17.0     SUMMARY OF ONCOLOGIC HISTORY: Oncology History  Malignant neoplasm of central portion of right breast in female, estrogen receptor positive ([place])  07/04/2020 Initial Diagnosis   Screening mammogram detected right breast mass and right axillary lymph nodes.  Right breast nipple retraction and retroareolar thickening.  2.4 cm mass with 2 abnormal lymph nodes.  Biopsy revealed invasive lobular cancer grade 1-2 ER 95%, PR 95%, Ki-67 30%, HER2 equivocal by IHC negative by FISH ratio 1.39   07/17/2020 Cancer Staging   Staging form: Breast, AJCC 8th Edition - Clinical stage from 07/17/2020: Stage IIA (cT2, cN1, cM0, G2, ER+, PR+, HER2-) - Signed by Nicholas Lose, MD on 07/17/2020   08/09/2020 Surgery   Rght lumpectomy ([facility]): invasive lobular carcinoma, 3.8cm, clear margins, 19/19 right axillary lymph nodes positive for metastatic carcinoma.    08/09/2020 Cancer Staging   Staging form: Breast, AJCC 8th Edition - Pathologic stage from 08/09/2020: Stage IIIA (pT2, pN3a, cM0, G2, ER+, PR+, HER2-) - Signed by Gardenia Phlegm, NP on 08/23/2020 Stage prefix: Initial diagnosis Histologic grading system: 3 grade system   09/15/2020 -  Chemotherapy    Patient is on Treatment Plan: BREAST TC Q21D        CHIEF COMPLIANT: Cycle 1 Taxotere and Cytoxan  INTERVAL HISTORY: Shelby [name] is a 50 y.o. with above-mentioned history of right breast cancer who underwent a right lumpectomy and is currently on adjuvant chemotherapy with Taxotere and Cytoxan. She presents to the clinic today for treatment.     ALLERGIES:  is allergic to codeine and peach flavor.  MEDICATIONS:   Current Outpatient Medications  Medication Sig Dispense Refill  . ALPRAZolam (XANAX) 0.5 MG tablet Take 1 tablet (0.5 mg total) by mouth at bedtime as needed for anxiety. 40 tablet 0  . buPROPion (WELLBUTRIN XL) 150 MG 24 hr tablet Take 1 tablet (150 mg total) by mouth daily. 60 tablet 0  . cephALEXin (KEFLEX) 500 MG capsule Take 1 capsule (500 mg total) by mouth 2 (two) times daily. 14 capsule 0  . cetirizine (ZYRTEC) 10 MG tablet Take 1 tablet (10 mg total) by mouth daily. 30 tablet 7  . citalopram (CELEXA) 20 MG tablet Take 1 tablet (20 mg total) by mouth daily. (Needs to be seen before next refill) (Patient not taking: Reported on 07/31/2020) 30 tablet 11  . dexamethasone (DECADRON) 4 MG tablet Take 2 tablets (8 mg total) by mouth daily. Take 1 tablet day before chemo and 1 tablet day after chemo with food 12 tablet 0  . fluconazole (DIFLUCAN) 150 MG tablet Take 1 tablet (150 mg total) by mouth every three (3) days as needed. 3 tablet 0  . fluticasone (FLONASE) 50 MCG/ACT nasal spray Place 2 sprays into both nostrils daily. 16 g 6  . ibuprofen (ADVIL) 800 MG tablet Take 1 tablet (800 mg total) by mouth every 8 (eight) hours as needed. 30 tablet 0  . lidocaine-prilocaine (EMLA) cream Apply to affected area once 30 g 3  . loratadine (CLARITIN) 10 MG tablet Take 1 tablet (10 mg total) by mouth daily. 30 tablet 11  . metFORMIN (GLUCOPHAGE) 500 MG tablet  Take 1 tablet (500 mg total) by mouth 2 (two) times daily with a meal. 90 tablet 0  . metoprolol tartrate (LOPRESSOR) 50 MG tablet Take 1 tablet (50 mg total) by mouth 2 (two) times daily. 60 tablet 6  . mupirocin cream (BACTROBAN) 2 % Apply 1 application topically 2 (two) times daily. 15 g 0  . nystatin (MYCOSTATIN/NYSTOP) powder Apply 1 application topically 2 (two) times daily. 15 g 2  . ondansetron (ZOFRAN) 8 MG tablet Take 1 tablet (8 mg total) by mouth 2 (two) times daily as needed for refractory nausea / vomiting. Start on day 3 after chemo.  30 tablet 1  . pantoprazole (PROTONIX) 40 MG tablet Take 1 tablet (40 mg total) by mouth daily. (Needs to be seen before next refill) 30 tablet 2  . prochlorperazine (COMPAZINE) 10 MG tablet Take 1 tablet (10 mg total) by mouth every 6 (six) hours as needed (Nausea or vomiting). 30 tablet 1  . tolterodine (DETROL LA) 2 MG 24 hr capsule Take 1 capsule (2 mg total) by mouth daily. 60 capsule 1  . traMADol (ULTRAM) 50 MG tablet Take 1 tablet (50 mg total) by mouth every 6 (six) hours as needed. 20 tablet 0   No current facility-administered medications for this visit.    PHYSICAL EXAMINATION: ECOG PERFORMANCE STATUS: 1 - Symptomatic but completely ambulatory  There were no vitals filed for this visit. There were no vitals filed for this visit.   LABORATORY DATA:  I have reviewed the data as listed CMP Latest Ref Rng & Units 08/03/2020 07/11/2020  Glucose 70 - 99 mg/dL 115(H) 113(H)  BUN 6 - 20 mg/dL 12 11  Creatinine 0.44 - 1.00 mg/dL 0.77 0.75  Sodium 135 - 145 mmol/L 138 145(H)  Potassium 3.5 - 5.1 mmol/L 3.9 4.5  Chloride 98 - 111 mmol/L 103 103  CO2 22 - 32 mmol/L 24 20  Calcium 8.9 - 10.3 mg/dL 9.1 9.1  Total Protein 6.5 - 8.1 g/dL 7.1 7.4  Total Bilirubin 0.3 - 1.2 mg/dL 0.8 0.4  Alkaline Phos 38 - 126 U/L 77 112  AST 15 - 41 U/L 23 26  ALT 0 - 44 U/L 41 42(H)    Lab Results  Component Value Date   WBC 9.0 09/15/2020   HGB 15.1 (H) 09/15/2020   HCT 44.8 09/15/2020   MCV 88.7 09/15/2020   PLT 295 09/15/2020   NEUTROABS 7.4 09/15/2020    ASSESSMENT & PLAN:  Malignant neoplasm of central portion of right breast in female, estrogen receptor positive ([place]) 07/04/2020:Screening mammogram detected right breast mass and right axillary lymph nodes. Right breast nipple retraction and retroareolar thickening. 2.4 cm mass with 2 abnormal lymph nodes. Biopsy revealed invasive lobular cancer grade 1-2 ER 95%, PR 95%, Ki-67 30%, HER2 equivocal by IHC negative by FISH ratio 1.39 T2N1  stageIIA  Right lumpectomy: 08/09/2020: ILC 3.8 cm, 19/19 LN Positive, Margins Neg  Plan: 1. Staging scans 2. Adjuvant chemo with TC X 6(because of her propensity for diabetes she may only end up getting 4) 3. XRT 4. Adj Anti estrogen therapy(with Verzenio) ------------------------------------------------------------------------------------------------------------------------------------ 08/30/2020: CT CAP: No evidence of metastatic disease 08/30/2020: Bone scan: No evidence of bone metastases -------------------------------------------------------------------------------------------------------------------- Current treatment: Cycle 1 day 1 Taxotere and Cytoxan Labs reviewed, chemo education completed, chemo consent obtained Antiemetics were reviewed  Return to clinic in 1 week for toxicity check    No orders of the defined types were placed in this encounter.  The patient has a  good understanding of the overall plan. she agrees with it. she will call with any problems that may develop before the next visit here.  Total time spent: 30 mins including face to face time and time spent for planning, charting and coordination of care  Rulon Eisenmenger, MD, MPH 09/15/2020  I, Molly Dorshimer, am acting as scribe for Dr. Nicholas Lose.  I have reviewed the above documentation for accuracy and completeness, and I agree with the above.

## 2020-09-15 ENCOUNTER — Inpatient Hospital Stay: Payer: BC Managed Care – PPO

## 2020-09-15 ENCOUNTER — Inpatient Hospital Stay: Payer: BC Managed Care – PPO | Admitting: Hematology and Oncology

## 2020-09-15 ENCOUNTER — Encounter: Payer: Self-pay | Admitting: *Deleted

## 2020-09-15 ENCOUNTER — Other Ambulatory Visit: Payer: Self-pay

## 2020-09-15 VITALS — BP 145/88 | HR 72 | Temp 98.4°F | Resp 18

## 2020-09-15 DIAGNOSIS — C50111 Malignant neoplasm of central portion of right female breast: Secondary | ICD-10-CM

## 2020-09-15 DIAGNOSIS — Z170 Estrogen receptor positive status [ER+]: Secondary | ICD-10-CM

## 2020-09-15 DIAGNOSIS — Z5189 Encounter for other specified aftercare: Secondary | ICD-10-CM | POA: Diagnosis not present

## 2020-09-15 DIAGNOSIS — Z7952 Long term (current) use of systemic steroids: Secondary | ICD-10-CM | POA: Diagnosis not present

## 2020-09-15 DIAGNOSIS — Z7984 Long term (current) use of oral hypoglycemic drugs: Secondary | ICD-10-CM | POA: Diagnosis not present

## 2020-09-15 DIAGNOSIS — Z5111 Encounter for antineoplastic chemotherapy: Secondary | ICD-10-CM | POA: Diagnosis not present

## 2020-09-15 DIAGNOSIS — Z79899 Other long term (current) drug therapy: Secondary | ICD-10-CM | POA: Diagnosis not present

## 2020-09-15 DIAGNOSIS — C773 Secondary and unspecified malignant neoplasm of axilla and upper limb lymph nodes: Secondary | ICD-10-CM | POA: Diagnosis not present

## 2020-09-15 DIAGNOSIS — Z95828 Presence of other vascular implants and grafts: Secondary | ICD-10-CM

## 2020-09-15 LAB — CMP (CANCER CENTER ONLY)
ALT: 32 U/L (ref 0–44)
AST: 15 U/L (ref 15–41)
Albumin: 3.8 g/dL (ref 3.5–5.0)
Alkaline Phosphatase: 103 U/L (ref 38–126)
Anion gap: 9 (ref 5–15)
BUN: 12 mg/dL (ref 6–20)
CO2: 21 mmol/L — ABNORMAL LOW (ref 22–32)
Calcium: 9.2 mg/dL (ref 8.9–10.3)
Chloride: 108 mmol/L (ref 98–111)
Creatinine: 0.8 mg/dL (ref 0.44–1.00)
GFR, Estimated: >60 mL/min (ref >60)
Glucose, Bld: 135 mg/dL — ABNORMAL HIGH (ref 70–99)
Potassium: 3.7 mmol/L (ref 3.5–5.1)
Sodium: 138 mmol/L (ref 135–145)
Total Bilirubin: 0.4 mg/dL (ref 0.3–1.2)
Total Protein: 7.6 g/dL (ref 6.5–8.1)

## 2020-09-15 LAB — CBC WITH DIFFERENTIAL (CANCER CENTER ONLY)
Abs Immature Granulocytes: 0.07 10*3/uL (ref 0.00–0.07)
Basophils Absolute: 0 10*3/uL (ref 0.0–0.1)
Basophils Relative: 0 %
Eosinophils Absolute: 0 10*3/uL (ref 0.0–0.5)
Eosinophils Relative: 0 %
HCT: 44.8 % (ref 36.0–46.0)
Hemoglobin: 15.1 g/dL — ABNORMAL HIGH (ref 12.0–15.0)
Immature Granulocytes: 1 %
Lymphocytes Relative: 14 %
Lymphs Abs: 1.3 10*3/uL (ref 0.7–4.0)
MCH: 29.9 pg (ref 26.0–34.0)
MCHC: 33.7 g/dL (ref 30.0–36.0)
MCV: 88.7 fL (ref 80.0–100.0)
Monocytes Absolute: 0.2 10*3/uL (ref 0.1–1.0)
Monocytes Relative: 2 %
Neutro Abs: 7.4 10*3/uL (ref 1.7–7.7)
Neutrophils Relative %: 83 %
Platelet Count: 295 10*3/uL (ref 150–400)
RBC: 5.05 MIL/uL (ref 3.87–5.11)
RDW: 13.5 % (ref 11.5–15.5)
WBC Count: 9 10*3/uL (ref 4.0–10.5)
nRBC: 0 % (ref 0.0–0.2)

## 2020-09-15 MED ORDER — SODIUM CHLORIDE 0.9% FLUSH
10.0000 mL | INTRAVENOUS | Status: DC | PRN
  Administered 2020-09-15: 10 mL
  Filled 2020-09-15: qty 10

## 2020-09-15 MED ORDER — PALONOSETRON HCL INJECTION 0.25 MG/5ML
INTRAVENOUS | Status: AC
  Filled 2020-09-15: qty 5

## 2020-09-15 MED ORDER — SODIUM CHLORIDE 0.9 % IV SOLN
Freq: Once | INTRAVENOUS | Status: AC
  Filled 2020-09-15: qty 250

## 2020-09-15 MED ORDER — SODIUM CHLORIDE 0.9% FLUSH
10.0000 mL | INTRAVENOUS | Status: DC | PRN
  Administered 2020-09-15: 10 mL via INTRAVENOUS
  Filled 2020-09-15: qty 10

## 2020-09-15 MED ORDER — PALONOSETRON HCL INJECTION 0.25 MG/5ML
0.2500 mg | Freq: Once | INTRAVENOUS | Status: AC
  Administered 2020-09-15: 0.25 mg via INTRAVENOUS

## 2020-09-15 MED ORDER — HEPARIN SOD (PORK) LOCK FLUSH 100 UNIT/ML IV SOLN
500.0000 [IU] | Freq: Once | INTRAVENOUS | Status: AC | PRN
  Administered 2020-09-15: 500 [IU]
  Filled 2020-09-15: qty 5

## 2020-09-15 MED ORDER — SODIUM CHLORIDE 0.9 % IV SOLN
10.0000 mg | Freq: Once | INTRAVENOUS | Status: AC
  Administered 2020-09-15: 10 mg via INTRAVENOUS
  Filled 2020-09-15: qty 10

## 2020-09-15 MED ORDER — SODIUM CHLORIDE 0.9 % IV SOLN
75.0000 mg/m2 | Freq: Once | INTRAVENOUS | Status: AC
  Administered 2020-09-15: 220 mg via INTRAVENOUS
  Filled 2020-09-15: qty 22

## 2020-09-15 MED ORDER — SODIUM CHLORIDE 0.9 % IV SOLN
600.0000 mg/m2 | Freq: Once | INTRAVENOUS | Status: AC
  Administered 2020-09-15: 1720 mg via INTRAVENOUS
  Filled 2020-09-15: qty 86

## 2020-09-15 NOTE — Patient Instructions (Signed)
Cyclophosphamide Injection What is this medicine? CYCLOPHOSPHAMIDE (sye kloe FOSS fa mide) is a chemotherapy drug. It slows the growth of cancer cells. This medicine is used to treat many types of cancer like lymphoma, myeloma, leukemia, breast cancer, and ovarian cancer, to name a few. This medicine may be used for other purposes; ask your health care provider or pharmacist if you have questions. COMMON BRAND NAME(S): Cytoxan, Neosar What should I tell my health care provider before I take this medicine? They need to know if you have any of these conditions:  heart disease  history of irregular heartbeat  infection  kidney disease  liver disease  low blood counts, like white cells, platelets, or red blood cells  on hemodialysis  recent or ongoing radiation therapy  scarring or thickening of the lungs  trouble passing urine  an unusual or allergic reaction to cyclophosphamide, other medicines, foods, dyes, or preservatives  pregnant or trying to get pregnant  breast-feeding How should I use this medicine? This drug is usually given as an injection into a vein or muscle or by infusion into a vein. It is administered in a hospital or clinic by a specially trained health care professional. Talk to your pediatrician regarding the use of this medicine in children. Special care may be needed. Overdosage: If you think you have taken too much of this medicine contact a poison control center or emergency room at once. NOTE: This medicine is only for you. Do not share this medicine with others. What if I miss a dose? It is important not to miss your dose. Call your doctor or health care professional if you are unable to keep an appointment. What may interact with this medicine?  amphotericin B  azathioprine  certain antivirals for HIV or hepatitis  certain medicines for blood pressure, heart disease, irregular heart beat  certain medicines that treat or prevent blood clots  like warfarin  certain other medicines for cancer  cyclosporine  etanercept  indomethacin  medicines that relax muscles for surgery  medicines to increase blood counts  metronidazole This list may not describe all possible interactions. Give your health care provider a list of all the medicines, herbs, non-prescription drugs, or dietary supplements you use. Also tell them if you smoke, drink alcohol, or use illegal drugs. Some items may interact with your medicine. What should I watch for while using this medicine? Your condition will be monitored carefully while you are receiving this medicine. You may need blood work done while you are taking this medicine. Drink water or other fluids as directed. Urinate often, even at night. Some products may contain alcohol. Ask your health care professional if this medicine contains alcohol. Be sure to tell all health care professionals you are taking this medicine. Certain medicines, like metronidazole and disulfiram, can cause an unpleasant reaction when taken with alcohol. The reaction includes flushing, headache, nausea, vomiting, sweating, and increased thirst. The reaction can last from 30 minutes to several hours. Do not become pregnant while taking this medicine or for 1 year after stopping it. Women should inform their health care professional if they wish to become pregnant or think they might be pregnant. Men should not father a child while taking this medicine and for 4 months after stopping it. There is potential for serious side effects to an unborn child. Talk to your health care professional for more information. Do not breast-feed an infant while taking this medicine or for 1 week after stopping it. This medicine has  caused ovarian failure in some women. This medicine may make it more difficult to get pregnant. Talk to your health care professional if you are concerned about your fertility. This medicine has caused decreased sperm  counts in some men. This may make it more difficult to father a child. Talk to your health care professional if you are concerned about your fertility. Call your health care professional for advice if you get a fever, chills, or sore throat, or other symptoms of a cold or flu. Do not treat yourself. This medicine decreases your body's ability to fight infections. Try to avoid being around people who are sick. Avoid taking medicines that contain aspirin, acetaminophen, ibuprofen, naproxen, or ketoprofen unless instructed by your health care professional. These medicines may hide a fever. Talk to your health care professional about your risk of cancer. You may be more at risk for certain types of cancer if you take this medicine. If you are going to need surgery or other procedure, tell your health care professional that you are using this medicine. Be careful brushing or flossing your teeth or using a toothpick because you may get an infection or bleed more easily. If you have any dental work done, tell your dentist you are receiving this medicine. What side effects may I notice from receiving this medicine? Side effects that you should report to your doctor or health care professional as soon as possible:  allergic reactions like skin rash, itching or hives, swelling of the face, lips, or tongue  breathing problems  nausea, vomiting  signs and symptoms of bleeding such as bloody or black, tarry stools; red or dark brown urine; spitting up blood or brown material that looks like coffee grounds; red spots on the skin; unusual bruising or bleeding from the eyes, gums, or nose  signs and symptoms of heart failure like fast, irregular heartbeat, sudden weight gain; swelling of the ankles, feet, hands  signs and symptoms of infection like fever; chills; cough; sore throat; pain or trouble passing urine  signs and symptoms of kidney injury like trouble passing urine or change in the amount of  urine  signs and symptoms of liver injury like dark yellow or brown urine; general ill feeling or flu-like symptoms; light-colored stools; loss of appetite; nausea; right upper belly pain; unusually weak or tired; yellowing of the eyes or skin Side effects that usually do not require medical attention (report to your doctor or health care professional if they continue or are bothersome):  confusion  decreased hearing  diarrhea  facial flushing  hair loss  headache  loss of appetite  missed menstrual periods  signs and symptoms of low red blood cells or anemia such as unusually weak or tired; feeling faint or lightheaded; falls  skin discoloration This list may not describe all possible side effects. Call your doctor for medical advice about side effects. You may report side effects to FDA at 1-800-FDA-1088. Where should I keep my medicine? This drug is given in a hospital or clinic and will not be stored at home. NOTE: This sheet is a summary. It may not cover all possible information. If you have questions about this medicine, talk to your doctor, pharmacist, or health care provider.  2021 Elsevier/Gold Standard (2019-03-22 09:53:29) Docetaxel injection What is this medicine? DOCETAXEL (doe se TAX el) is a chemotherapy drug. It targets fast dividing cells, like cancer cells, and causes these cells to die. This medicine is used to treat many types of cancers like   breast cancer, certain stomach cancers, head and neck cancer, lung cancer, and prostate cancer. This medicine may be used for other purposes; ask your health care provider or pharmacist if you have questions. COMMON BRAND NAME(S): Docefrez, Taxotere What should I tell my health care provider before I take this medicine? They need to know if you have any of these conditions:  infection (especially a virus infection such as chickenpox, cold sores, or herpes)  liver disease  low blood counts, like low white cell,  platelet, or red cell counts  an unusual or allergic reaction to docetaxel, polysorbate 80, other chemotherapy agents, other medicines, foods, dyes, or preservatives  pregnant or trying to get pregnant  breast-feeding How should I use this medicine? This drug is given as an infusion into a vein. It is administered in a hospital or clinic by a specially trained health care professional. Talk to your pediatrician regarding the use of this medicine in children. Special care may be needed. Overdosage: If you think you have taken too much of this medicine contact a poison control center or emergency room at once. NOTE: This medicine is only for you. Do not share this medicine with others. What if I miss a dose? It is important not to miss your dose. Call your doctor or health care professional if you are unable to keep an appointment. What may interact with this medicine? Do not take this medicine with any of the following medications:  live virus vaccines This medicine may also interact with the following medications:  aprepitant  certain antibiotics like erythromycin or clarithromycin  certain antivirals for HIV or hepatitis  certain medicines for fungal infections like fluconazole, itraconazole, ketoconazole, posaconazole, or voriconazole  cimetidine  ciprofloxacin  conivaptan  cyclosporine  dronedarone  fluvoxamine  grapefruit juice  imatinib  verapamil This list may not describe all possible interactions. Give your health care provider a list of all the medicines, herbs, non-prescription drugs, or dietary supplements you use. Also tell them if you smoke, drink alcohol, or use illegal drugs. Some items may interact with your medicine. What should I watch for while using this medicine? Your condition will be monitored carefully while you are receiving this medicine. You will need important blood work done while you are taking this medicine. Call your doctor or health  care professional for advice if you get a fever, chills or sore throat, or other symptoms of a cold or flu. Do not treat yourself. This drug decreases your body's ability to fight infections. Try to avoid being around people who are sick. Some products may contain alcohol. Ask your health care professional if this medicine contains alcohol. Be sure to tell all health care professionals you are taking this medicine. Certain medicines, like metronidazole and disulfiram, can cause an unpleasant reaction when taken with alcohol. The reaction includes flushing, headache, nausea, vomiting, sweating, and increased thirst. The reaction can last from 30 minutes to several hours. You may get drowsy or dizzy. Do not drive, use machinery, or do anything that needs mental alertness until you know how this medicine affects you. Do not stand or sit up quickly, especially if you are an older patient. This reduces the risk of dizzy or fainting spells. Alcohol may interfere with the effect of this medicine. Talk to your health care professional about your risk of cancer. You may be more at risk for certain types of cancer if you take this medicine. Do not become pregnant while taking this medicine or for 6   months after stopping it. Women should inform their doctor if they wish to become pregnant or think they might be pregnant. There is a potential for serious side effects to an unborn child. Talk to your health care professional or pharmacist for more information. Do not breast-feed an infant while taking this medicine or for 1 week after stopping it. Males who get this medicine must use a condom during sex with females who can get pregnant. If you get a woman pregnant, the baby could have birth defects. The baby could die before they are born. You will need to continue wearing a condom for 3 months after stopping the medicine. Tell your health care provider right away if your partner becomes pregnant while you are taking this  medicine. This may interfere with the ability to father a child. You should talk to your doctor or health care professional if you are concerned about your fertility. What side effects may I notice from receiving this medicine? Side effects that you should report to your doctor or health care professional as soon as possible:  allergic reactions like skin rash, itching or hives, swelling of the face, lips, or tongue  blurred vision  breathing problems  changes in vision  low blood counts - This drug may decrease the number of white blood cells, red blood cells and platelets. You may be at increased risk for infections and bleeding.  nausea and vomiting  pain, redness or irritation at site where injected  pain, tingling, numbness in the hands or feet  redness, blistering, peeling, or loosening of the skin, including inside the mouth  signs of decreased platelets or bleeding - bruising, pinpoint red spots on the skin, black, tarry stools, nosebleeds  signs of decreased red blood cells - unusually weak or tired, fainting spells, lightheadedness  signs of infection - fever or chills, cough, sore throat, pain or difficulty passing urine  swelling of the ankle, feet, hands Side effects that usually do not require medical attention (report to your doctor or health care professional if they continue or are bothersome):  constipation  diarrhea  fingernail or toenail changes  hair loss  loss of appetite  mouth sores  muscle pain This list may not describe all possible side effects. Call your doctor for medical advice about side effects. You may report side effects to FDA at 1-800-FDA-1088. Where should I keep my medicine? This drug is given in a hospital or clinic and will not be stored at home. NOTE: This sheet is a summary. It may not cover all possible information. If you have questions about this medicine, talk to your doctor, pharmacist, or health care provider.  2021  Elsevier/Gold Standard (2019-05-17 19:50:31)  

## 2020-09-15 NOTE — Assessment & Plan Note (Signed)
07/04/2020:Screening mammogram detected right breast mass and right axillary lymph nodes. Right breast nipple retraction and retroareolar thickening. 2.4 cm mass with 2 abnormal lymph nodes. Biopsy revealed invasive lobular cancer grade 1-2 ER 95%, PR 95%, Ki-67 30%, HER2 equivocal by IHC negative by FISH ratio 1.39 T2N1 stageIIA  Right lumpectomy: 08/09/2020: ILC 3.8 cm, 19/19 LN Positive, Margins Neg  Plan: 1. Staging scans 2. Adjuvant chemo with TC X 6(because of her propensity for diabetes she may only end up getting 4) 3. XRT 4. Adj Anti estrogen therapy(with Verzenio) ------------------------------------------------------------------------------------------------------------------------------------ 08/30/2020: CT CAP: No evidence of metastatic disease 08/30/2020: Bone scan: No evidence of bone metastases -------------------------------------------------------------------------------------------------------------------- Current treatment: Cycle 1 day 1 Taxotere and Cytoxan Labs reviewed, chemo education completed, chemo consent obtained Antiemetics were reviewed  Return to clinic in 1 week for toxicity check 

## 2020-09-15 NOTE — Patient Instructions (Signed)
Implanted Port Insertion, Care After This sheet gives you information about how to care for yourself after your procedure. Your health care provider may also give you more specific instructions. If you have problems or questions, contact your health care provider. What can I expect after the procedure? After the procedure, it is common to have:  Discomfort at the port insertion site.  Bruising on the skin over the port. This should improve over 3-4 days. Follow these instructions at home: Port care  After your port is placed, you will get a manufacturer's information card. The card has information about your port. Keep this card with you at all times.  Take care of the port as told by your health care provider. Ask your health care provider if you or a family member can get training for taking care of the port at home. A home health care nurse may also take care of the port.  Make sure to remember what type of port you have. Incision care  Follow instructions from your health care provider about how to take care of your port insertion site. Make sure you: ? Wash your hands with soap and water before and after you change your bandage (dressing). If soap and water are not available, use hand sanitizer. ? Change your dressing as told by your health care provider. ? Leave stitches (sutures), skin glue, or adhesive strips in place. These skin closures may need to stay in place for 2 weeks or longer. If adhesive strip edges start to loosen and curl up, you may trim the loose edges. Do not remove adhesive strips completely unless your health care provider tells you to do that.  Check your port insertion site every day for signs of infection. Check for: ? Redness, swelling, or pain. ? Fluid or blood. ? Warmth. ? Pus or a bad smell.      Activity  Return to your normal activities as told by your health care provider. Ask your health care provider what activities are safe for you.  Do not  lift anything that is heavier than 10 lb (4.5 kg), or the limit that you are told, until your health care provider says that it is safe. General instructions  Take over-the-counter and prescription medicines only as told by your health care provider.  Do not take baths, swim, or use a hot tub until your health care provider approves. Ask your health care provider if you may take showers. You may only be allowed to take sponge baths.  Do not drive for 24 hours if you were given a sedative during your procedure.  Wear a medical alert bracelet in case of an emergency. This will tell any health care providers that you have a port.  Keep all follow-up visits as told by your health care provider. This is important. Contact a health care provider if:  You cannot flush your port with saline as directed, or you cannot draw blood from the port.  You have a fever or chills.  You have redness, swelling, or pain around your port insertion site.  You have fluid or blood coming from your port insertion site.  Your port insertion site feels warm to the touch.  You have pus or a bad smell coming from the port insertion site. Get help right away if:  You have chest pain or shortness of breath.  You have bleeding from your port that you cannot control. Summary  Take care of the port as told by your   health care provider. Keep the manufacturer's information card with you at all times.  Change your dressing as told by your health care provider.  Contact a health care provider if you have a fever or chills or if you have redness, swelling, or pain around your port insertion site.  Keep all follow-up visits as told by your health care provider. This information is not intended to replace advice given to you by your health care provider. Make sure you discuss any questions you have with your health care provider. Document Revised: 01/13/2018 Document Reviewed: 01/13/2018 Elsevier Patient Education   2021 Elsevier Inc.  

## 2020-09-18 ENCOUNTER — Inpatient Hospital Stay: Payer: BC Managed Care – PPO

## 2020-09-18 ENCOUNTER — Other Ambulatory Visit: Payer: Self-pay

## 2020-09-18 VITALS — BP 123/69 | HR 58 | Resp 18

## 2020-09-18 DIAGNOSIS — Z5189 Encounter for other specified aftercare: Secondary | ICD-10-CM | POA: Diagnosis not present

## 2020-09-18 DIAGNOSIS — C50111 Malignant neoplasm of central portion of right female breast: Secondary | ICD-10-CM | POA: Diagnosis not present

## 2020-09-18 DIAGNOSIS — Z79899 Other long term (current) drug therapy: Secondary | ICD-10-CM | POA: Diagnosis not present

## 2020-09-18 DIAGNOSIS — Z170 Estrogen receptor positive status [ER+]: Secondary | ICD-10-CM | POA: Diagnosis not present

## 2020-09-18 DIAGNOSIS — C773 Secondary and unspecified malignant neoplasm of axilla and upper limb lymph nodes: Secondary | ICD-10-CM | POA: Diagnosis not present

## 2020-09-18 DIAGNOSIS — Z7952 Long term (current) use of systemic steroids: Secondary | ICD-10-CM | POA: Diagnosis not present

## 2020-09-18 DIAGNOSIS — Z5111 Encounter for antineoplastic chemotherapy: Secondary | ICD-10-CM | POA: Diagnosis not present

## 2020-09-18 DIAGNOSIS — Z7984 Long term (current) use of oral hypoglycemic drugs: Secondary | ICD-10-CM | POA: Diagnosis not present

## 2020-09-18 MED ORDER — PEGFILGRASTIM-CBQV 6 MG/0.6ML ~~LOC~~ SOSY
PREFILLED_SYRINGE | SUBCUTANEOUS | Status: AC
  Filled 2020-09-18: qty 0.6

## 2020-09-18 MED ORDER — PEGFILGRASTIM-CBQV 6 MG/0.6ML ~~LOC~~ SOSY
6.0000 mg | PREFILLED_SYRINGE | Freq: Once | SUBCUTANEOUS | Status: AC
  Administered 2020-09-18: 6 mg via SUBCUTANEOUS

## 2020-09-18 NOTE — Patient Instructions (Signed)
Pegfilgrastim injection What is this medicine? PEGFILGRASTIM (PEG fil gra stim) is a long-acting granulocyte colony-stimulating factor that stimulates the growth of neutrophils, a type of white blood cell important in the body's fight against infection. It is used to reduce the incidence of fever and infection in patients with certain types of cancer who are receiving chemotherapy that affects the bone marrow, and to increase survival after being exposed to high doses of radiation. This medicine may be used for other purposes; ask your health care provider or pharmacist if you have questions. COMMON BRAND NAME(S): Fulphila, Neulasta, Nyvepria, UDENYCA, Ziextenzo What should I tell my health care provider before I take this medicine? They need to know if you have any of these conditions:  kidney disease  latex allergy  ongoing radiation therapy  sickle cell disease  skin reactions to acrylic adhesives (On-Body Injector only)  an unusual or allergic reaction to pegfilgrastim, filgrastim, other medicines, foods, dyes, or preservatives  pregnant or trying to get pregnant  breast-feeding How should I use this medicine? This medicine is for injection under the skin. If you get this medicine at home, you will be taught how to prepare and give the pre-filled syringe or how to use the On-body Injector. Refer to the patient Instructions for Use for detailed instructions. Use exactly as directed. Tell your healthcare provider immediately if you suspect that the On-body Injector may not have performed as intended or if you suspect the use of the On-body Injector resulted in a missed or partial dose. It is important that you put your used needles and syringes in a special sharps container. Do not put them in a trash can. If you do not have a sharps container, call your pharmacist or healthcare provider to get one. Talk to your pediatrician regarding the use of this medicine in children. While this drug  may be prescribed for selected conditions, precautions do apply. Overdosage: If you think you have taken too much of this medicine contact a poison control center or emergency room at once. NOTE: This medicine is only for you. Do not share this medicine with others. What if I miss a dose? It is important not to miss your dose. Call your doctor or health care professional if you miss your dose. If you miss a dose due to an On-body Injector failure or leakage, a new dose should be administered as soon as possible using a single prefilled syringe for manual use. What may interact with this medicine? Interactions have not been studied. This list may not describe all possible interactions. Give your health care provider a list of all the medicines, herbs, non-prescription drugs, or dietary supplements you use. Also tell them if you smoke, drink alcohol, or use illegal drugs. Some items may interact with your medicine. What should I watch for while using this medicine? Your condition will be monitored carefully while you are receiving this medicine. You may need blood work done while you are taking this medicine. Talk to your health care provider about your risk of cancer. You may be more at risk for certain types of cancer if you take this medicine. If you are going to need a MRI, CT scan, or other procedure, tell your doctor that you are using this medicine (On-Body Injector only). What side effects may I notice from receiving this medicine? Side effects that you should report to your doctor or health care professional as soon as possible:  allergic reactions (skin rash, itching or hives, swelling of   the face, lips, or tongue)  back pain  dizziness  fever  pain, redness, or irritation at site where injected  pinpoint red spots on the skin  red or dark-brown urine  shortness of breath or breathing problems  stomach or side pain, or pain at the shoulder  swelling  tiredness  trouble  passing urine or change in the amount of urine  unusual bruising or bleeding Side effects that usually do not require medical attention (report to your doctor or health care professional if they continue or are bothersome):  bone pain  muscle pain This list may not describe all possible side effects. Call your doctor for medical advice about side effects. You may report side effects to FDA at 1-800-FDA-1088. Where should I keep my medicine? Keep out of the reach of children. If you are using this medicine at home, you will be instructed on how to store it. Throw away any unused medicine after the expiration date on the label. NOTE: This sheet is a summary. It may not cover all possible information. If you have questions about this medicine, talk to your doctor, pharmacist, or health care provider.  2021 Elsevier/Gold Standard (2019-07-09 13:20:51)  

## 2020-09-19 ENCOUNTER — Telehealth: Payer: Self-pay | Admitting: *Deleted

## 2020-09-19 ENCOUNTER — Telehealth: Payer: Self-pay | Admitting: Hematology and Oncology

## 2020-09-19 NOTE — Telephone Encounter (Signed)
-----   Message from Willia Craze, RN sent at 09/15/2020  4:42 PM EDT ----- Regarding: gudena first time followup Pt was first time Taxotere and cytoxan on 09/15/20, tolerated well. Needs follow up, thanks!

## 2020-09-19 NOTE — Telephone Encounter (Signed)
Received call from pt with complaint of bone pain from pegfilgrastim injection.  Per MD pt to take 650 mg p.o tylenol in combination with 220 mg p.o aleve q. 6 hours.  Pt verbalized understanding and appreciative of the advice.

## 2020-09-19 NOTE — Telephone Encounter (Signed)
Scheduled per 3/18 los. Called pt no answer, pt will receive an updated appt calendar per next visit appt notes

## 2020-09-19 NOTE — Telephone Encounter (Signed)
Called pt to see how she did with her treatment.  She c/o's of leg pain & it is keeping her awake at night since her pegfilgrastim shot & it makes it hard for her to even walk.   She denies any other problems. She did take claritin/Tylenol prior to shot & continues claritin daily & tylenol every 4-6 hours.  Reviewed med list & asked if she had any tramadol.  She states she does not. Message routed to Dr Rolla Plate RN to see if she can have anything else for pain.

## 2020-09-20 NOTE — Progress Notes (Signed)
Patient Care Team: [name], NP as PCP - General (Nurse Practitioner) Mauro Kaufmann, RN as Oncology Nurse Navigator Rockwell Germany, RN as Oncology Nurse Navigator  DIAGNOSIS:    ICD-10-CM   1. Malignant neoplasm of central portion of right breast in female, estrogen receptor positive ([place])  C50.111    Z17.0     SUMMARY OF ONCOLOGIC HISTORY: Oncology History  Malignant neoplasm of central portion of right breast in female, estrogen receptor positive ([place])  07/04/2020 Initial Diagnosis   Screening mammogram detected right breast mass and right axillary lymph nodes.  Right breast nipple retraction and retroareolar thickening.  2.4 cm mass with 2 abnormal lymph nodes.  Biopsy revealed invasive lobular cancer grade 1-2 ER 95%, PR 95%, Ki-67 30%, HER2 equivocal by IHC negative by FISH ratio 1.39   07/17/2020 Cancer Staging   Staging form: Breast, AJCC 8th Edition - Clinical stage from 07/17/2020: Stage IIA (cT2, cN1, cM0, G2, ER+, PR+, HER2-) - Signed by Nicholas Lose, MD on 07/17/2020   08/09/2020 Surgery   Rght lumpectomy ([facility]): invasive lobular carcinoma, 3.8cm, clear margins, 19/19 right axillary lymph nodes positive for metastatic carcinoma.    08/09/2020 Cancer Staging   Staging form: Breast, AJCC 8th Edition - Pathologic stage from 08/09/2020: Stage IIIA (pT2, pN3a, cM0, G2, ER+, PR+, HER2-) - Signed by Gardenia Phlegm, NP on 08/23/2020 Stage prefix: Initial diagnosis Histologic grading system: 3 grade system   09/15/2020 -  Chemotherapy    Patient is on Treatment Plan: BREAST TC Q21D        CHIEF COMPLIANT: Cycle 1 Day 8 Taxotere and Cytoxan  INTERVAL HISTORY: ZO Shelby [name] is a 50 y.o. with above-mentioned history of right breast cancerwhounderwent a right lumpectomyand is currently on adjuvant chemotherapy with Taxotere andCytoxan. She presentsto the clinictoday for a toxicity check following cycle 1.   ALLERGIES:  is allergic to codeine and peach  flavor.  MEDICATIONS:  Current Outpatient Medications  Medication Sig Dispense Refill  . ALPRAZolam (XANAX) 0.5 MG tablet Take 1 tablet (0.5 mg total) by mouth at bedtime as needed for anxiety. 40 tablet 0  . buPROPion (WELLBUTRIN XL) 150 MG 24 hr tablet Take 1 tablet (150 mg total) by mouth daily. 60 tablet 0  . cephALEXin (KEFLEX) 500 MG capsule Take 1 capsule (500 mg total) by mouth 2 (two) times daily. 14 capsule 0  . cetirizine (ZYRTEC) 10 MG tablet Take 1 tablet (10 mg total) by mouth daily. 30 tablet 7  . citalopram (CELEXA) 20 MG tablet Take 1 tablet (20 mg total) by mouth daily. (Needs to be seen before next refill) (Patient not taking: Reported on 07/31/2020) 30 tablet 11  . dexamethasone (DECADRON) 4 MG tablet Take 2 tablets (8 mg total) by mouth daily. Take 1 tablet day before chemo and 1 tablet day after chemo with food 12 tablet 0  . fluconazole (DIFLUCAN) 150 MG tablet Take 1 tablet (150 mg total) by mouth every three (3) days as needed. 3 tablet 0  . fluticasone (FLONASE) 50 MCG/ACT nasal spray Place 2 sprays into both nostrils daily. 16 g 6  . ibuprofen (ADVIL) 800 MG tablet Take 1 tablet (800 mg total) by mouth every 8 (eight) hours as needed. 30 tablet 0  . lidocaine-prilocaine (EMLA) cream Apply to affected area once 30 g 3  . loratadine (CLARITIN) 10 MG tablet Take 1 tablet (10 mg total) by mouth daily. 30 tablet 11  . metFORMIN (GLUCOPHAGE) 500 MG tablet Take  1 tablet (500 mg total) by mouth 2 (two) times daily with a meal. 90 tablet 0  . metoprolol tartrate (LOPRESSOR) 50 MG tablet Take 1 tablet (50 mg total) by mouth 2 (two) times daily. 60 tablet 6  . mupirocin cream (BACTROBAN) 2 % Apply 1 application topically 2 (two) times daily. 15 g 0  . nystatin (MYCOSTATIN/NYSTOP) powder Apply 1 application topically 2 (two) times daily. 15 g 2  . ondansetron (ZOFRAN) 8 MG tablet Take 1 tablet (8 mg total) by mouth 2 (two) times daily as needed for refractory nausea / vomiting.  Start on day 3 after chemo. 30 tablet 1  . pantoprazole (PROTONIX) 40 MG tablet Take 1 tablet (40 mg total) by mouth daily. (Needs to be seen before next refill) 30 tablet 2  . prochlorperazine (COMPAZINE) 10 MG tablet Take 1 tablet (10 mg total) by mouth every 6 (six) hours as needed (Nausea or vomiting). 30 tablet 1  . tolterodine (DETROL LA) 2 MG 24 hr capsule Take 1 capsule (2 mg total) by mouth daily. 60 capsule 1  . traMADol (ULTRAM) 50 MG tablet Take 1 tablet (50 mg total) by mouth every 6 (six) hours as needed. 20 tablet 0   No current facility-administered medications for this visit.    PHYSICAL EXAMINATION: ECOG PERFORMANCE STATUS: 1 - Symptomatic but completely ambulatory  Vitals:   09/21/20 1508  BP: (!) 148/81  Pulse: 79  Resp: 18  Temp: 97.7 F (36.5 C)  SpO2: 100%   Filed Weights   09/21/20 1508  Weight: (!) 394 lb 11.2 oz (179 kg)    LABORATORY DATA:  I have reviewed the data as listed CMP Latest Ref Rng & Units 09/15/2020 08/03/2020 07/11/2020  Glucose 70 - 99 mg/dL 135(H) 115(H) 113(H)  BUN 6 - 20 mg/dL _0 Creatinine 0.44 - 1.00 mg/dL 0.80 0.77 0.75  Sodium 135 - 145 mmol/L 138 138 145(H)  Potassium 3.5 - 5.1 mmol/L 3.7 3.9 4.5  Chloride 98 - 111 mmol/L 108 103 103  CO2 22 - 32 mmol/L 21(L) 24 20  Calcium 8.9 - 10.3 mg/dL 9.2 9.1 9.1  Total Protein 6.5 - 8.1 g/dL 7.6 7.1 7.4  Total Bilirubin 0.3 - 1.2 mg/dL 0.4 0.8 0.4  Alkaline Phos 38 - 126 U/L 103 77 112  AST 15 - 41 U/L _1 ALT 0 - 44 U/L 32 41 42(H)    Lab Results  Component Value Date   WBC 1.9 (L) 09/21/2020   HGB 12.9 09/21/2020   HCT 37.9 09/21/2020   MCV 88.3 09/21/2020   PLT 153 09/21/2020   NEUTROABS PENDING 09/21/2020    ASSESSMENT & PLAN:  Malignant neoplasm of central portion of right breast in female, estrogen receptor positive ([name]) 07/04/2020:Screening mammogram detected right breast mass and right axillary lymph nodes. Right breast nipple retraction and retroareolar  thickening. 2.4 cm mass with 2 abnormal lymph nodes. Biopsy revealed invasive lobular cancer grade 1-2 ER 95%, PR 95%, Ki-67 30%, HER2 equivocal by IHC negative by FISH ratio 1.39 T2N1 stageIIA  Right lumpectomy: 08/09/2020:ILC 3.8 cm, 19/19 LN Positive, Margins Neg  Plan: 1. Staging scans 2. Adjuvant chemo with TC X 6(because of her propensity for diabetes she may only end up getting 4) 3. XRT 4. Adj Anti estrogen therapy(with Verzenio) ------------------------------------------------------------------------------------------------------------------------------------ 08/30/2020: CT CAP:No evidence of metastatic disease 08/30/2020: Bone scan:No evidence of bone metastases -------------------------------------------------------------------------------------------------------------------- Current treatment: Cycle 1 day 8 Taxotere and Cytoxan Chemo Toxicities: 1.  Severe bone pain due to Neulasta: Instructed her to take Ultram next cycle. Denies any nausea vomiting or diarrhea or constipation.  Obesity: Mendel Ryder our nurse practitioner spent significant amount of time discussing measures to enable weight loss. Return to clinic in 2 weeks for cycle 2    No orders of the defined types were placed in this encounter.  The patient has a good understanding of the overall plan. she agrees with it. she will call with any problems that may develop before the next visit here.  Total time spent: 30 mins including face to face time and time spent for planning, charting and coordination of care  Rulon Eisenmenger, MD, MPH 09/21/2020  I, Molly Dorshimer, am acting as scribe for Dr. Nicholas Lose.  I have reviewed the above documentation for accuracy and completeness, and I agree with the above.

## 2020-09-20 NOTE — Assessment & Plan Note (Signed)
07/04/2020:Screening mammogram detected right breast mass and right axillary lymph nodes. Right breast nipple retraction and retroareolar thickening. 2.4 cm mass with 2 abnormal lymph nodes. Biopsy revealed invasive lobular cancer grade 1-2 ER 95%, PR 95%, Ki-67 30%, HER2 equivocal by IHC negative by FISH ratio 1.39 T2N1 stageIIA  Right lumpectomy: 08/09/2020:ILC 3.8 cm, 19/19 LN Positive, Margins Neg  Plan: 1. Staging scans 2. Adjuvant chemo with TC X 6(because of her propensity for diabetes she may only end up getting 4) 3. XRT 4. Adj Anti estrogen therapy(with Verzenio) ------------------------------------------------------------------------------------------------------------------------------------ 08/30/2020: CT CAP:No evidence of metastatic disease 08/30/2020: Bone scan:No evidence of bone metastases -------------------------------------------------------------------------------------------------------------------- Current treatment: Cycle 1 day 8 Taxotere and Cytoxan Chemo Toxicities:  Return to clinic in 2 weeks for cycle 2

## 2020-09-21 ENCOUNTER — Encounter: Payer: Self-pay | Admitting: *Deleted

## 2020-09-21 ENCOUNTER — Inpatient Hospital Stay: Payer: BC Managed Care – PPO | Admitting: Hematology and Oncology

## 2020-09-21 ENCOUNTER — Inpatient Hospital Stay: Payer: BC Managed Care – PPO

## 2020-09-21 ENCOUNTER — Other Ambulatory Visit: Payer: Self-pay

## 2020-09-21 DIAGNOSIS — C50111 Malignant neoplasm of central portion of right female breast: Secondary | ICD-10-CM

## 2020-09-21 DIAGNOSIS — C773 Secondary and unspecified malignant neoplasm of axilla and upper limb lymph nodes: Secondary | ICD-10-CM | POA: Diagnosis not present

## 2020-09-21 DIAGNOSIS — Z95828 Presence of other vascular implants and grafts: Secondary | ICD-10-CM

## 2020-09-21 DIAGNOSIS — Z5189 Encounter for other specified aftercare: Secondary | ICD-10-CM | POA: Diagnosis not present

## 2020-09-21 DIAGNOSIS — Z170 Estrogen receptor positive status [ER+]: Secondary | ICD-10-CM

## 2020-09-21 DIAGNOSIS — Z7952 Long term (current) use of systemic steroids: Secondary | ICD-10-CM | POA: Diagnosis not present

## 2020-09-21 DIAGNOSIS — Z7984 Long term (current) use of oral hypoglycemic drugs: Secondary | ICD-10-CM | POA: Diagnosis not present

## 2020-09-21 DIAGNOSIS — Z79899 Other long term (current) drug therapy: Secondary | ICD-10-CM | POA: Diagnosis not present

## 2020-09-21 DIAGNOSIS — Z5111 Encounter for antineoplastic chemotherapy: Secondary | ICD-10-CM | POA: Diagnosis not present

## 2020-09-21 LAB — CBC WITH DIFFERENTIAL (CANCER CENTER ONLY)
Abs Immature Granulocytes: 0.12 10*3/uL — ABNORMAL HIGH (ref 0.00–0.07)
Basophils Absolute: 0 10*3/uL (ref 0.0–0.1)
Basophils Relative: 2 %
Eosinophils Absolute: 0 10*3/uL (ref 0.0–0.5)
Eosinophils Relative: 1 %
HCT: 37.9 % (ref 36.0–46.0)
Hemoglobin: 12.9 g/dL (ref 12.0–15.0)
Immature Granulocytes: 7 %
Lymphocytes Relative: 59 %
Lymphs Abs: 1.1 10*3/uL (ref 0.7–4.0)
MCH: 30.1 pg (ref 26.0–34.0)
MCHC: 34 g/dL (ref 30.0–36.0)
MCV: 88.3 fL (ref 80.0–100.0)
Monocytes Absolute: 0.2 10*3/uL (ref 0.1–1.0)
Monocytes Relative: 13 %
Neutro Abs: 0.3 10*3/uL — CL (ref 1.7–7.7)
Neutrophils Relative %: 18 %
Platelet Count: 153 10*3/uL (ref 150–400)
RBC: 4.29 MIL/uL (ref 3.87–5.11)
RDW: 13.5 % (ref 11.5–15.5)
WBC Count: 1.9 10*3/uL — ABNORMAL LOW (ref 4.0–10.5)
nRBC: 0 % (ref 0.0–0.2)

## 2020-09-21 LAB — CMP (CANCER CENTER ONLY)
ALT: 34 U/L (ref 0–44)
AST: 19 U/L (ref 15–41)
Albumin: 3.4 g/dL — ABNORMAL LOW (ref 3.5–5.0)
Alkaline Phosphatase: 98 U/L (ref 38–126)
Anion gap: 9 (ref 5–15)
BUN: 11 mg/dL (ref 6–20)
CO2: 26 mmol/L (ref 22–32)
Calcium: 8.6 mg/dL — ABNORMAL LOW (ref 8.9–10.3)
Chloride: 107 mmol/L (ref 98–111)
Creatinine: 0.68 mg/dL (ref 0.44–1.00)
GFR, Estimated: >60 mL/min (ref >60)
Glucose, Bld: 93 mg/dL (ref 70–99)
Potassium: 3.9 mmol/L (ref 3.5–5.1)
Sodium: 142 mmol/L (ref 135–145)
Total Bilirubin: 0.8 mg/dL (ref 0.3–1.2)
Total Protein: 6.6 g/dL (ref 6.5–8.1)

## 2020-09-21 MED ORDER — SODIUM CHLORIDE 0.9% FLUSH
10.0000 mL | INTRAVENOUS | Status: DC | PRN
  Administered 2020-09-21: 10 mL via INTRAVENOUS
  Filled 2020-09-21: qty 10

## 2020-09-21 MED ORDER — HEPARIN SOD (PORK) LOCK FLUSH 100 UNIT/ML IV SOLN
500.0000 [IU] | Freq: Once | INTRAVENOUS | Status: AC
  Administered 2020-09-21: 500 [IU] via INTRAVENOUS
  Filled 2020-09-21: qty 5

## 2020-09-21 NOTE — Progress Notes (Signed)
CRITICAL VALUE STICKER  CRITICAL VALUE: ANC 0.3  Geraldo Docker, RN  DATE & TIME NOTIFIED: 09/21/20 at 63   MD NOTIFIED: Nicholas Lose, MD   RESPONSE: MD notified, verbalized understanding.  No orders received.

## 2020-09-21 NOTE — Patient Instructions (Signed)
Implanted Port Insertion, Care After This sheet gives you information about how to care for yourself after your procedure. Your health care provider may also give you more specific instructions. If you have problems or questions, contact your health care provider. What can I expect after the procedure? After the procedure, it is common to have:  Discomfort at the port insertion site.  Bruising on the skin over the port. This should improve over 3-4 days. Follow these instructions at home: Port care  After your port is placed, you will get a manufacturer's information card. The card has information about your port. Keep this card with you at all times.  Take care of the port as told by your health care provider. Ask your health care provider if you or a family member can get training for taking care of the port at home. A home health care nurse may also take care of the port.  Make sure to remember what type of port you have. Incision care  Follow instructions from your health care provider about how to take care of your port insertion site. Make sure you: ? Wash your hands with soap and water before and after you change your bandage (dressing). If soap and water are not available, use hand sanitizer. ? Change your dressing as told by your health care provider. ? Leave stitches (sutures), skin glue, or adhesive strips in place. These skin closures may need to stay in place for 2 weeks or longer. If adhesive strip edges start to loosen and curl up, you may trim the loose edges. Do not remove adhesive strips completely unless your health care provider tells you to do that.  Check your port insertion site every day for signs of infection. Check for: ? Redness, swelling, or pain. ? Fluid or blood. ? Warmth. ? Pus or a bad smell.      Activity  Return to your normal activities as told by your health care provider. Ask your health care provider what activities are safe for you.  Do not  lift anything that is heavier than 10 lb (4.5 kg), or the limit that you are told, until your health care provider says that it is safe. General instructions  Take over-the-counter and prescription medicines only as told by your health care provider.  Do not take baths, swim, or use a hot tub until your health care provider approves. Ask your health care provider if you may take showers. You may only be allowed to take sponge baths.  Do not drive for 24 hours if you were given a sedative during your procedure.  Wear a medical alert bracelet in case of an emergency. This will tell any health care providers that you have a port.  Keep all follow-up visits as told by your health care provider. This is important. Contact a health care provider if:  You cannot flush your port with saline as directed, or you cannot draw blood from the port.  You have a fever or chills.  You have redness, swelling, or pain around your port insertion site.  You have fluid or blood coming from your port insertion site.  Your port insertion site feels warm to the touch.  You have pus or a bad smell coming from the port insertion site. Get help right away if:  You have chest pain or shortness of breath.  You have bleeding from your port that you cannot control. Summary  Take care of the port as told by your   health care provider. Keep the manufacturer's information card with you at all times.  Change your dressing as told by your health care provider.  Contact a health care provider if you have a fever or chills or if you have redness, swelling, or pain around your port insertion site.  Keep all follow-up visits as told by your health care provider. This information is not intended to replace advice given to you by your health care provider. Make sure you discuss any questions you have with your health care provider. Document Revised: 01/13/2018 Document Reviewed: 01/13/2018 Elsevier Patient Education   2021 Elsevier Inc.  

## 2020-09-22 ENCOUNTER — Other Ambulatory Visit: Payer: Self-pay | Admitting: Hematology and Oncology

## 2020-09-25 ENCOUNTER — Telehealth: Payer: Self-pay | Admitting: Hematology and Oncology

## 2020-09-25 NOTE — Telephone Encounter (Signed)
Scheduled per 3/24 los. Pt will receive an updated appt calendar per next visit appt notes

## 2020-09-27 ENCOUNTER — Telehealth: Payer: Self-pay

## 2020-09-27 NOTE — Telephone Encounter (Signed)
Medication denied Reason for denial- the request does not meet the definition of medical necessity found in the member's benefit book.  Alternatives: Lansoprazole Omeprazole Esomeprazole   Please advise

## 2020-09-27 NOTE — Telephone Encounter (Signed)
Pantoprazole 40mg  requires PA.  Key: [id number]  PA started and sent to plan

## 2020-10-03 ENCOUNTER — Telehealth: Payer: Self-pay | Admitting: *Deleted

## 2020-10-03 NOTE — Telephone Encounter (Signed)
Connected with [employer].  EMLA cream authorized 10/02/2020 through 12/30/2020.

## 2020-10-08 NOTE — Assessment & Plan Note (Signed)
07/04/2020:Screening mammogram detected right breast mass and right axillary lymph nodes. Right breast nipple retraction and retroareolar thickening. 2.4 cm mass with 2 abnormal lymph nodes. Biopsy revealed invasive lobular cancer grade 1-2 ER 95%, PR 95%, Ki-67 30%, HER2 equivocal by IHC negative by FISH ratio 1.39 T2N1 stageIIA  Right lumpectomy: 08/09/2020:ILC 3.8 cm, 19/19 LN Positive, Margins Neg  Plan: 1. Staging scans 2. Adjuvant chemo with TC X 6(because of her propensity for diabetes she may only end up getting 4) 3. XRT 4. Adj Anti estrogen therapy(with Verzenio) ------------------------------------------------------------------------------------------------------------------------------------ 08/30/2020: CT CAP:No evidence of metastatic disease 08/30/2020: Bone scan:No evidence of bone metastases -------------------------------------------------------------------------------------------------------------------- Current treatment: Cycle2 Taxotere and Cytoxan Chemo Toxicities: 1. Severe bone pain due to Neulasta: Instructed her to take Ultram next cycle. Denies any nausea vomiting or diarrhea or constipation.  Return to clinic in 3 weeks for cycle 3

## 2020-10-08 NOTE — Progress Notes (Signed)
Patient Care Team: [name], NP as PCP - General (Nurse Practitioner) Mauro Kaufmann, RN as Oncology Nurse Navigator Rockwell Germany, RN as Oncology Nurse Navigator  DIAGNOSIS:    ICD-10-CM   1. Malignant neoplasm of central portion of right breast in female, estrogen receptor positive ([place])  C50.111    Z17.0     SUMMARY OF ONCOLOGIC HISTORY: Oncology History  Malignant neoplasm of central portion of right breast in female, estrogen receptor positive ([place])  07/04/2020 Initial Diagnosis   Screening mammogram detected right breast mass and right axillary lymph nodes.  Right breast nipple retraction and retroareolar thickening.  2.4 cm mass with 2 abnormal lymph nodes.  Biopsy revealed invasive lobular cancer grade 1-2 ER 95%, PR 95%, Ki-67 30%, HER2 equivocal by IHC negative by FISH ratio 1.39   07/17/2020 Cancer Staging   Staging form: Breast, AJCC 8th Edition - Clinical stage from 07/17/2020: Stage IIA (cT2, cN1, cM0, G2, ER+, PR+, HER2-) - Signed by Nicholas Lose, MD on 07/17/2020   08/09/2020 Surgery   Rght lumpectomy ([facility]): invasive lobular carcinoma, 3.8cm, clear margins, 19/19 right axillary lymph nodes positive for metastatic carcinoma.    08/09/2020 Cancer Staging   Staging form: Breast, AJCC 8th Edition - Pathologic stage from 08/09/2020: Stage IIIA (pT2, pN3a, cM0, G2, ER+, PR+, HER2-) - Signed by Gardenia Phlegm, NP on 08/23/2020 Stage prefix: Initial diagnosis Histologic grading system: 3 grade system   09/15/2020 -  Chemotherapy    Patient is on Treatment Plan: BREAST TC Q21D        CHIEF COMPLIANT: Cycle2Day 1 Taxotere and Cytoxan  INTERVAL HISTORY: Shelby [name] is a 50 y.o. with above-mentioned history of right breast cancerwhounderwent a right lumpectomyand is currently on adjuvant chemotherapy withTaxotereandCytoxan. She presentsto the clinictoday for a toxicity check and cycle 2  After first cycle of chemotherapy she had fatigue and  diffuse bone pain that lasted for a whole week.  She did not get any relief with Tylenol or Advil.  For this cycle she intends to take narcotic pain medication for pain relief.  Otherwise she recovered her energy levels.  ALLERGIES:  is allergic to codeine and peach flavor.  MEDICATIONS:  Current Outpatient Medications  Medication Sig Dispense Refill  . ALPRAZolam (XANAX) 0.5 MG tablet Take 1 tablet (0.5 mg total) by mouth at bedtime as needed for anxiety. 40 tablet 0  . buPROPion (WELLBUTRIN XL) 150 MG 24 hr tablet Take 1 tablet (150 mg total) by mouth daily. 60 tablet 0  . cetirizine (ZYRTEC) 10 MG tablet Take 1 tablet (10 mg total) by mouth daily. 30 tablet 7  . citalopram (CELEXA) 20 MG tablet Take 1 tablet (20 mg total) by mouth daily. (Needs to be seen before next refill) (Patient not taking: Reported on 07/31/2020) 30 tablet 11  . dexamethasone (DECADRON) 4 MG tablet Take 2 tablets (8 mg total) by mouth daily. Take 1 tablet day before chemo and 1 tablet day after chemo with food 12 tablet 0  . fluconazole (DIFLUCAN) 150 MG tablet Take 1 tablet (150 mg total) by mouth every three (3) days as needed. 3 tablet 0  . fluticasone (FLONASE) 50 MCG/ACT nasal spray Place 2 sprays into both nostrils daily. 16 g 6  . ibuprofen (ADVIL) 800 MG tablet Take 1 tablet (800 mg total) by mouth every 8 (eight) hours as needed. 30 tablet 0  . lidocaine-prilocaine (EMLA) cream Apply to affected area once 30 g 3  . loratadine (  CLARITIN) 10 MG tablet Take 1 tablet (10 mg total) by mouth daily. 30 tablet 11  . metFORMIN (GLUCOPHAGE) 500 MG tablet Take 1 tablet (500 mg total) by mouth 2 (two) times daily with a meal. 90 tablet 0  . metoprolol tartrate (LOPRESSOR) 50 MG tablet Take 1 tablet (50 mg total) by mouth 2 (two) times daily. 60 tablet 6  . mupirocin cream (BACTROBAN) 2 % Apply 1 application topically 2 (two) times daily. 15 g 0  . nystatin (MYCOSTATIN/NYSTOP) powder Apply 1 application topically 2 (two)  times daily. 15 g 2  . ondansetron (ZOFRAN) 8 MG tablet Take 1 tablet (8 mg total) by mouth 2 (two) times daily as needed for refractory nausea / vomiting. Start on day 3 after chemo. 30 tablet 1  . pantoprazole (PROTONIX) 40 MG tablet Take 1 tablet (40 mg total) by mouth daily. (Needs to be seen before next refill) 30 tablet 2  . prochlorperazine (COMPAZINE) 10 MG tablet Take 1 tablet (10 mg total) by mouth every 6 (six) hours as needed (Nausea or vomiting). 30 tablet 1  . tolterodine (DETROL LA) 2 MG 24 hr capsule Take 1 capsule (2 mg total) by mouth daily. 60 capsule 1  . traMADol (ULTRAM) 50 MG tablet Take 1 tablet (50 mg total) by mouth every 6 (six) hours as needed. 20 tablet 0   No current facility-administered medications for this visit.   Facility-Administered Medications Ordered in Other Visits  Medication Dose Route Frequency Provider Last Rate Last Admin  . cyclophosphamide (CYTOXAN) 1,440 mg in sodium chloride 0.9 % 250 mL chemo infusion  500 mg/m2 (Treatment Plan Recorded) Intravenous Once Nicholas Lose, MD      . DOCEtaxel (TAXOTERE) 200 mg in sodium chloride 0.9 % 500 mL chemo infusion  70 mg/m2 (Treatment Plan Recorded) Intravenous Once Nicholas Lose, MD      . heparin lock flush 100 unit/mL  500 Units Intracatheter Once PRN Nicholas Lose, MD      . sodium chloride flush (NS) 0.9 % injection 10 mL  10 mL Intracatheter PRN Nicholas Lose, MD        PHYSICAL EXAMINATION: ECOG PERFORMANCE STATUS: 1 - Symptomatic but completely ambulatory  Vitals:   10/09/20 1154  BP: (!) 117/97  Pulse: (!) 102  Resp: 20  Temp: 97.9 F (36.6 C)  SpO2: 99%   Filed Weights   10/09/20 1154  Weight: (!) 393 lb 4.8 oz (178.4 kg)    LABORATORY DATA:  I have reviewed the data as listed CMP Latest Ref Rng & Units 10/09/2020 09/21/2020 09/15/2020  Glucose 70 - 99 mg/dL 112(H) 93 135(H)  BUN 6 - 20 mg/dL 16 11 12   Creatinine 0.44 - 1.00 mg/dL 0.75 0.68 0.80  Sodium 135 - 145 mmol/L 140 142 138   Potassium 3.5 - 5.1 mmol/L 4.2 3.9 3.7  Chloride 98 - 111 mmol/L 105 107 108  CO2 22 - 32 mmol/L 21(L) 26 21(L)  Calcium 8.9 - 10.3 mg/dL 9.7 8.6(L) 9.2  Total Protein 6.5 - 8.1 g/dL 7.5 6.6 7.6  Total Bilirubin 0.3 - 1.2 mg/dL 0.3 0.8 0.4  Alkaline Phos 38 - 126 U/L 100 98 103  AST 15 - 41 U/L 11(L) 19 15  ALT 0 - 44 U/L 17 34 32    Lab Results  Component Value Date   WBC 15.3 (H) 10/09/2020   HGB 13.2 10/09/2020   HCT 39.4 10/09/2020   MCV 89.1 10/09/2020   PLT 392 10/09/2020  NEUTROABS 11.5 (H) 10/09/2020    ASSESSMENT & PLAN:  Malignant neoplasm of central portion of right breast in female, estrogen receptor positive ([place]) 07/04/2020:Screening mammogram detected right breast mass and right axillary lymph nodes. Right breast nipple retraction and retroareolar thickening. 2.4 cm mass with 2 abnormal lymph nodes. Biopsy revealed invasive lobular cancer grade 1-2 ER 95%, PR 95%, Ki-67 30%, HER2 equivocal by IHC negative by FISH ratio 1.39 T2N1 stageIIA  Right lumpectomy: 08/09/2020:ILC 3.8 cm, 19/19 LN Positive, Margins Neg  Plan: 1. Staging scans 2. Adjuvant chemo with TC X 6(because of her propensity for diabetes she may only end up getting 4) 3. XRT 4. Adj Anti estrogen therapy(with Verzenio) ------------------------------------------------------------------------------------------------------------------------------------ 08/30/2020: CT CAP:No evidence of metastatic disease 08/30/2020: Bone scan:No evidence of bone metastases -------------------------------------------------------------------------------------------------------------------- Current treatment: Cycle2 Taxotere and Cytoxan Chemo Toxicities: 1. Severe bone pain due to Neulasta: Instructed her to take Ultram next cycle. Denies any nausea vomiting or diarrhea or constipation.  Return to clinic in 3 weeks for cycle 3    No orders of the defined types were placed in this encounter.  The patient has  a good understanding of the overall plan. she agrees with it. she will call with any problems that may develop before the next visit here.  Total time spent: 30 mins including face to face time and time spent for planning, charting and coordination of care  Rulon Eisenmenger, MD, MPH 10/09/2020  I, Molly Dorshimer, am acting as scribe for Dr. Nicholas Lose.  I have reviewed the above documentation for accuracy and completeness, and I agree with the above.

## 2020-10-09 ENCOUNTER — Other Ambulatory Visit: Payer: Self-pay

## 2020-10-09 ENCOUNTER — Inpatient Hospital Stay: Payer: BC Managed Care – PPO

## 2020-10-09 ENCOUNTER — Inpatient Hospital Stay: Payer: BC Managed Care – PPO | Admitting: Hematology and Oncology

## 2020-10-09 ENCOUNTER — Inpatient Hospital Stay: Payer: BC Managed Care – PPO | Attending: Hematology and Oncology

## 2020-10-09 ENCOUNTER — Encounter: Payer: Self-pay | Admitting: *Deleted

## 2020-10-09 VITALS — HR 99

## 2020-10-09 DIAGNOSIS — Z170 Estrogen receptor positive status [ER+]: Secondary | ICD-10-CM | POA: Insufficient documentation

## 2020-10-09 DIAGNOSIS — Z79899 Other long term (current) drug therapy: Secondary | ICD-10-CM | POA: Diagnosis not present

## 2020-10-09 DIAGNOSIS — Z5189 Encounter for other specified aftercare: Secondary | ICD-10-CM | POA: Diagnosis not present

## 2020-10-09 DIAGNOSIS — C50111 Malignant neoplasm of central portion of right female breast: Secondary | ICD-10-CM

## 2020-10-09 DIAGNOSIS — Z95828 Presence of other vascular implants and grafts: Secondary | ICD-10-CM

## 2020-10-09 DIAGNOSIS — Z5111 Encounter for antineoplastic chemotherapy: Secondary | ICD-10-CM | POA: Insufficient documentation

## 2020-10-09 DIAGNOSIS — C773 Secondary and unspecified malignant neoplasm of axilla and upper limb lymph nodes: Secondary | ICD-10-CM | POA: Insufficient documentation

## 2020-10-09 LAB — CMP (CANCER CENTER ONLY)
ALT: 17 U/L (ref 0–44)
AST: 11 U/L — ABNORMAL LOW (ref 15–41)
Albumin: 3.7 g/dL (ref 3.5–5.0)
Alkaline Phosphatase: 100 U/L (ref 38–126)
Anion gap: 14 (ref 5–15)
BUN: 16 mg/dL (ref 6–20)
CO2: 21 mmol/L — ABNORMAL LOW (ref 22–32)
Calcium: 9.7 mg/dL (ref 8.9–10.3)
Chloride: 105 mmol/L (ref 98–111)
Creatinine: 0.75 mg/dL (ref 0.44–1.00)
GFR, Estimated: >60 mL/min (ref >60)
Glucose, Bld: 112 mg/dL — ABNORMAL HIGH (ref 70–99)
Potassium: 4.2 mmol/L (ref 3.5–5.1)
Sodium: 140 mmol/L (ref 135–145)
Total Bilirubin: 0.3 mg/dL (ref 0.3–1.2)
Total Protein: 7.5 g/dL (ref 6.5–8.1)

## 2020-10-09 LAB — CBC WITH DIFFERENTIAL (CANCER CENTER ONLY)
Abs Immature Granulocytes: 0.08 10*3/uL — ABNORMAL HIGH (ref 0.00–0.07)
Basophils Absolute: 0 10*3/uL (ref 0.0–0.1)
Basophils Relative: 0 %
Eosinophils Absolute: 0 10*3/uL (ref 0.0–0.5)
Eosinophils Relative: 0 %
HCT: 39.4 % (ref 36.0–46.0)
Hemoglobin: 13.2 g/dL (ref 12.0–15.0)
Immature Granulocytes: 1 %
Lymphocytes Relative: 19 %
Lymphs Abs: 2.9 10*3/uL (ref 0.7–4.0)
MCH: 29.9 pg (ref 26.0–34.0)
MCHC: 33.5 g/dL (ref 30.0–36.0)
MCV: 89.1 fL (ref 80.0–100.0)
Monocytes Absolute: 0.7 10*3/uL (ref 0.1–1.0)
Monocytes Relative: 5 %
Neutro Abs: 11.5 10*3/uL — ABNORMAL HIGH (ref 1.7–7.7)
Neutrophils Relative %: 75 %
Platelet Count: 392 10*3/uL (ref 150–400)
RBC: 4.42 MIL/uL (ref 3.87–5.11)
RDW: 15 % (ref 11.5–15.5)
WBC Count: 15.3 10*3/uL — ABNORMAL HIGH (ref 4.0–10.5)
nRBC: 0 % (ref 0.0–0.2)

## 2020-10-09 MED ORDER — SODIUM CHLORIDE 0.9% FLUSH
10.0000 mL | INTRAVENOUS | Status: DC | PRN
  Administered 2020-10-09: 10 mL
  Filled 2020-10-09: qty 10

## 2020-10-09 MED ORDER — SODIUM CHLORIDE 0.9 % IV SOLN
500.0000 mg/m2 | Freq: Once | INTRAVENOUS | Status: AC
  Administered 2020-10-09: 1440 mg via INTRAVENOUS
  Filled 2020-10-09: qty 72

## 2020-10-09 MED ORDER — PALONOSETRON HCL INJECTION 0.25 MG/5ML
INTRAVENOUS | Status: AC
  Filled 2020-10-09: qty 5

## 2020-10-09 MED ORDER — SODIUM CHLORIDE 0.9 % IV SOLN
10.0000 mg | Freq: Once | INTRAVENOUS | Status: AC
  Administered 2020-10-09: 10 mg via INTRAVENOUS
  Filled 2020-10-09: qty 10

## 2020-10-09 MED ORDER — PALONOSETRON HCL INJECTION 0.25 MG/5ML
0.2500 mg | Freq: Once | INTRAVENOUS | Status: AC
  Administered 2020-10-09: 0.25 mg via INTRAVENOUS

## 2020-10-09 MED ORDER — HEPARIN SOD (PORK) LOCK FLUSH 100 UNIT/ML IV SOLN
500.0000 [IU] | Freq: Once | INTRAVENOUS | Status: AC | PRN
  Administered 2020-10-09: 500 [IU]
  Filled 2020-10-09: qty 5

## 2020-10-09 MED ORDER — SODIUM CHLORIDE 0.9 % IV SOLN
70.0000 mg/m2 | Freq: Once | INTRAVENOUS | Status: AC
  Administered 2020-10-09: 200 mg via INTRAVENOUS
  Filled 2020-10-09: qty 20

## 2020-10-09 MED ORDER — SODIUM CHLORIDE 0.9 % IV SOLN
Freq: Once | INTRAVENOUS | Status: AC
Start: 2020-10-09 — End: 2020-10-09
  Filled 2020-10-09: qty 250

## 2020-10-09 MED ORDER — SODIUM CHLORIDE 0.9% FLUSH
10.0000 mL | INTRAVENOUS | Status: AC | PRN
  Administered 2020-10-09: 10 mL
  Filled 2020-10-09: qty 10

## 2020-10-09 NOTE — Patient Instructions (Signed)
Implanted Port Insertion, Care After This sheet gives you information about how to care for yourself after your procedure. Your health care provider may also give you more specific instructions. If you have problems or questions, contact your health care provider. What can I expect after the procedure? After the procedure, it is common to have:  Discomfort at the port insertion site.  Bruising on the skin over the port. This should improve over 3-4 days. Follow these instructions at home: Port care  After your port is placed, you will get a manufacturer's information card. The card has information about your port. Keep this card with you at all times.  Take care of the port as told by your health care provider. Ask your health care provider if you or a family member can get training for taking care of the port at home. A home health care nurse may also take care of the port.  Make sure to remember what type of port you have. Incision care  Follow instructions from your health care provider about how to take care of your port insertion site. Make sure you: ? Wash your hands with soap and water before and after you change your bandage (dressing). If soap and water are not available, use hand sanitizer. ? Change your dressing as told by your health care provider. ? Leave stitches (sutures), skin glue, or adhesive strips in place. These skin closures may need to stay in place for 2 weeks or longer. If adhesive strip edges start to loosen and curl up, you may trim the loose edges. Do not remove adhesive strips completely unless your health care provider tells you to do that.  Check your port insertion site every day for signs of infection. Check for: ? Redness, swelling, or pain. ? Fluid or blood. ? Warmth. ? Pus or a bad smell.      Activity  Return to your normal activities as told by your health care provider. Ask your health care provider what activities are safe for you.  Do not  lift anything that is heavier than 10 lb (4.5 kg), or the limit that you are told, until your health care provider says that it is safe. General instructions  Take over-the-counter and prescription medicines only as told by your health care provider.  Do not take baths, swim, or use a hot tub until your health care provider approves. Ask your health care provider if you may take showers. You may only be allowed to take sponge baths.  Do not drive for 24 hours if you were given a sedative during your procedure.  Wear a medical alert bracelet in case of an emergency. This will tell any health care providers that you have a port.  Keep all follow-up visits as told by your health care provider. This is important. Contact a health care provider if:  You cannot flush your port with saline as directed, or you cannot draw blood from the port.  You have a fever or chills.  You have redness, swelling, or pain around your port insertion site.  You have fluid or blood coming from your port insertion site.  Your port insertion site feels warm to the touch.  You have pus or a bad smell coming from the port insertion site. Get help right away if:  You have chest pain or shortness of breath.  You have bleeding from your port that you cannot control. Summary  Take care of the port as told by your   health care provider. Keep the manufacturer's information card with you at all times.  Change your dressing as told by your health care provider.  Contact a health care provider if you have a fever or chills or if you have redness, swelling, or pain around your port insertion site.  Keep all follow-up visits as told by your health care provider. This information is not intended to replace advice given to you by your health care provider. Make sure you discuss any questions you have with your health care provider. Document Revised: 01/13/2018 Document Reviewed: 01/13/2018 Elsevier Patient Education   2021 Elsevier Inc.  

## 2020-10-09 NOTE — Patient Instructions (Signed)
[facility] Discharge Instructions for Patients Receiving Chemotherapy  Today you received the following chemotherapy agents: Taxotere, Cytoxan  To help prevent nausea and vomiting after your treatment, we encourage you to take your nausea medication as prescribed by your physician.    If you develop nausea and vomiting that is not controlled by your nausea medication, call the clinic.   BELOW ARE SYMPTOMS THAT SHOULD BE REPORTED IMMEDIATELY:  *FEVER GREATER THAN 100.5 F  *CHILLS WITH OR WITHOUT FEVER  NAUSEA AND VOMITING THAT IS NOT CONTROLLED WITH YOUR NAUSEA MEDICATION  *UNUSUAL SHORTNESS OF BREATH  *UNUSUAL BRUISING OR BLEEDING  TENDERNESS IN MOUTH AND THROAT WITH OR WITHOUT PRESENCE OF ULCERS  *URINARY PROBLEMS  *BOWEL PROBLEMS  UNUSUAL RASH Items with * indicate a potential emergency and should be followed up as soon as possible.  Feel free to call the clinic should you have any questions or concerns. The clinic phone number is [phone].  Please show the [place] at check-in to the Emergency Department and triage nurse.

## 2020-10-11 ENCOUNTER — Other Ambulatory Visit: Payer: Self-pay

## 2020-10-11 ENCOUNTER — Inpatient Hospital Stay: Payer: BC Managed Care – PPO

## 2020-10-11 VITALS — BP 114/72 | HR 59 | Temp 97.8°F | Resp 16

## 2020-10-11 DIAGNOSIS — Z170 Estrogen receptor positive status [ER+]: Secondary | ICD-10-CM

## 2020-10-11 DIAGNOSIS — C50111 Malignant neoplasm of central portion of right female breast: Secondary | ICD-10-CM | POA: Diagnosis not present

## 2020-10-11 DIAGNOSIS — Z5189 Encounter for other specified aftercare: Secondary | ICD-10-CM | POA: Diagnosis not present

## 2020-10-11 DIAGNOSIS — Z79899 Other long term (current) drug therapy: Secondary | ICD-10-CM | POA: Diagnosis not present

## 2020-10-11 DIAGNOSIS — C773 Secondary and unspecified malignant neoplasm of axilla and upper limb lymph nodes: Secondary | ICD-10-CM | POA: Diagnosis not present

## 2020-10-11 DIAGNOSIS — Z5111 Encounter for antineoplastic chemotherapy: Secondary | ICD-10-CM | POA: Diagnosis not present

## 2020-10-11 MED ORDER — PEGFILGRASTIM-CBQV 6 MG/0.6ML ~~LOC~~ SOSY
PREFILLED_SYRINGE | SUBCUTANEOUS | Status: AC
  Filled 2020-10-11: qty 0.6

## 2020-10-11 MED ORDER — PEGFILGRASTIM-CBQV 6 MG/0.6ML ~~LOC~~ SOSY
6.0000 mg | PREFILLED_SYRINGE | Freq: Once | SUBCUTANEOUS | Status: AC
  Administered 2020-10-11: 6 mg via SUBCUTANEOUS

## 2020-10-11 NOTE — Patient Instructions (Signed)
Pegfilgrastim injection What is this medicine? PEGFILGRASTIM (PEG fil gra stim) is a long-acting granulocyte colony-stimulating factor that stimulates the growth of neutrophils, a type of white blood cell important in the body's fight against infection. It is used to reduce the incidence of fever and infection in patients with certain types of cancer who are receiving chemotherapy that affects the bone marrow, and to increase survival after being exposed to high doses of radiation. This medicine may be used for other purposes; ask your health care provider or pharmacist if you have questions. COMMON BRAND NAME(S): Fulphila, Neulasta, Nyvepria, UDENYCA, Ziextenzo What should I tell my health care provider before I take this medicine? They need to know if you have any of these conditions:  kidney disease  latex allergy  ongoing radiation therapy  sickle cell disease  skin reactions to acrylic adhesives (On-Body Injector only)  an unusual or allergic reaction to pegfilgrastim, filgrastim, other medicines, foods, dyes, or preservatives  pregnant or trying to get pregnant  breast-feeding How should I use this medicine? This medicine is for injection under the skin. If you get this medicine at home, you will be taught how to prepare and give the pre-filled syringe or how to use the On-body Injector. Refer to the patient Instructions for Use for detailed instructions. Use exactly as directed. Tell your healthcare provider immediately if you suspect that the On-body Injector may not have performed as intended or if you suspect the use of the On-body Injector resulted in a missed or partial dose. It is important that you put your used needles and syringes in a special sharps container. Do not put them in a trash can. If you do not have a sharps container, call your pharmacist or healthcare provider to get one. Talk to your pediatrician regarding the use of this medicine in children. While this drug  may be prescribed for selected conditions, precautions do apply. Overdosage: If you think you have taken too much of this medicine contact a poison control center or emergency room at once. NOTE: This medicine is only for you. Do not share this medicine with others. What if I miss a dose? It is important not to miss your dose. Call your doctor or health care professional if you miss your dose. If you miss a dose due to an On-body Injector failure or leakage, a new dose should be administered as soon as possible using a single prefilled syringe for manual use. What may interact with this medicine? Interactions have not been studied. This list may not describe all possible interactions. Give your health care provider a list of all the medicines, herbs, non-prescription drugs, or dietary supplements you use. Also tell them if you smoke, drink alcohol, or use illegal drugs. Some items may interact with your medicine. What should I watch for while using this medicine? Your condition will be monitored carefully while you are receiving this medicine. You may need blood work done while you are taking this medicine. Talk to your health care provider about your risk of cancer. You may be more at risk for certain types of cancer if you take this medicine. If you are going to need a MRI, CT scan, or other procedure, tell your doctor that you are using this medicine (On-Body Injector only). What side effects may I notice from receiving this medicine? Side effects that you should report to your doctor or health care professional as soon as possible:  allergic reactions (skin rash, itching or hives, swelling of   the face, lips, or tongue)  back pain  dizziness  fever  pain, redness, or irritation at site where injected  pinpoint red spots on the skin  red or dark-brown urine  shortness of breath or breathing problems  stomach or side pain, or pain at the shoulder  swelling  tiredness  trouble  passing urine or change in the amount of urine  unusual bruising or bleeding Side effects that usually do not require medical attention (report to your doctor or health care professional if they continue or are bothersome):  bone pain  muscle pain This list may not describe all possible side effects. Call your doctor for medical advice about side effects. You may report side effects to FDA at 1-800-FDA-1088. Where should I keep my medicine? Keep out of the reach of children. If you are using this medicine at home, you will be instructed on how to store it. Throw away any unused medicine after the expiration date on the label. NOTE: This sheet is a summary. It may not cover all possible information. If you have questions about this medicine, talk to your doctor, pharmacist, or health care provider.  2021 Elsevier/Gold Standard (2019-07-09 13:20:51)  

## 2020-10-16 ENCOUNTER — Ambulatory Visit: Payer: Self-pay | Admitting: Nurse Practitioner

## 2020-10-26 ENCOUNTER — Other Ambulatory Visit: Payer: Self-pay | Admitting: *Deleted

## 2020-10-26 DIAGNOSIS — K219 Gastro-esophageal reflux disease without esophagitis: Secondary | ICD-10-CM

## 2020-10-26 MED ORDER — PANTOPRAZOLE SODIUM 40 MG PO TBEC: 40.0000 mg | DELAYED_RELEASE_TABLET | Freq: Every day | ORAL | 0 refills | Status: DC

## 2020-11-02 NOTE — Progress Notes (Signed)
Patient Care Team: [name], NP as PCP - General (Nurse Practitioner) Mauro Kaufmann, RN as Oncology Nurse Navigator Rockwell Germany, RN as Oncology Nurse Navigator  DIAGNOSIS:    ICD-10-CM   1. Malignant neoplasm of central portion of right breast in female, estrogen receptor positive ([place])  C50.111    Z17.0     SUMMARY OF ONCOLOGIC HISTORY: Oncology History  Malignant neoplasm of central portion of right breast in female, estrogen receptor positive ([place])  07/04/2020 Initial Diagnosis   Screening mammogram detected right breast mass and right axillary lymph nodes.  Right breast nipple retraction and retroareolar thickening.  2.4 cm mass with 2 abnormal lymph nodes.  Biopsy revealed invasive lobular cancer grade 1-2 ER 95%, PR 95%, Ki-67 30%, HER2 equivocal by IHC negative by FISH ratio 1.39   07/17/2020 Cancer Staging   Staging form: Breast, AJCC 8th Edition - Clinical stage from 07/17/2020: Stage IIA (cT2, cN1, cM0, G2, ER+, PR+, HER2-) - Signed by Nicholas Lose, MD on 07/17/2020   08/09/2020 Surgery   Rght lumpectomy ([facility]): invasive lobular carcinoma, 3.8cm, clear margins, 19/19 right axillary lymph nodes positive for metastatic carcinoma.    08/09/2020 Cancer Staging   Staging form: Breast, AJCC 8th Edition - Pathologic stage from 08/09/2020: Stage IIIA (pT2, pN3a, cM0, G2, ER+, PR+, HER2-) - Signed by Gardenia Phlegm, NP on 08/23/2020 Stage prefix: Initial diagnosis Histologic grading system: 3 grade system   09/15/2020 -  Chemotherapy    Patient is on Treatment Plan: BREAST TC Q21D        CHIEF COMPLIANT: Cycle3Day 1Taxotere and Cytoxan  INTERVAL HISTORY: Shelby [name] is a 50 y.o. with above-mentioned history of right breast cancerwhounderwent a right lumpectomyand is currently on adjuvant chemotherapy withTaxotereandCytoxan. She presentsto the clinictoday fora toxicity check and cycle 3.  Her major complaint is fatigue related to chemotherapy  she denies any nausea or vomiting.  She denies any constipation or diarrhea.  ALLERGIES:  is allergic to codeine and peach flavor.  MEDICATIONS:  Current Outpatient Medications  Medication Sig Dispense Refill  . ALPRAZolam (XANAX) 0.5 MG tablet Take 1 tablet (0.5 mg total) by mouth at bedtime as needed for anxiety. 40 tablet 0  . buPROPion (WELLBUTRIN XL) 150 MG 24 hr tablet Take 1 tablet (150 mg total) by mouth daily. 60 tablet 0  . cetirizine (ZYRTEC) 10 MG tablet Take 1 tablet (10 mg total) by mouth daily. 30 tablet 7  . citalopram (CELEXA) 20 MG tablet Take 1 tablet (20 mg total) by mouth daily. (Needs to be seen before next refill) (Patient not taking: Reported on 07/31/2020) 30 tablet 11  . dexamethasone (DECADRON) 4 MG tablet Take 2 tablets (8 mg total) by mouth daily. Take 1 tablet day before chemo and 1 tablet day after chemo with food 12 tablet 0  . fluconazole (DIFLUCAN) 150 MG tablet Take 1 tablet (150 mg total) by mouth every three (3) days as needed. 3 tablet 0  . fluticasone (FLONASE) 50 MCG/ACT nasal spray Place 2 sprays into both nostrils daily. 16 g 6  . ibuprofen (ADVIL) 800 MG tablet Take 1 tablet (800 mg total) by mouth every 8 (eight) hours as needed. 30 tablet 0  . lidocaine-prilocaine (EMLA) cream Apply to affected area once 30 g 3  . loratadine (CLARITIN) 10 MG tablet Take 1 tablet (10 mg total) by mouth daily. 30 tablet 11  . metFORMIN (GLUCOPHAGE) 500 MG tablet Take 1 tablet (500 mg total) by mouth  2 (two) times daily with a meal. 90 tablet 0  . metoprolol tartrate (LOPRESSOR) 50 MG tablet Take 1 tablet (50 mg total) by mouth 2 (two) times daily. 60 tablet 6  . mupirocin cream (BACTROBAN) 2 % Apply 1 application topically 2 (two) times daily. 15 g 0  . nystatin (MYCOSTATIN/NYSTOP) powder Apply 1 application topically 2 (two) times daily. 15 g 2  . ondansetron (ZOFRAN) 8 MG tablet Take 1 tablet (8 mg total) by mouth 2 (two) times daily as needed for refractory nausea /  vomiting. Start on day 3 after chemo. 30 tablet 1  . pantoprazole (PROTONIX) 40 MG tablet Take 1 tablet (40 mg total) by mouth daily. 30 tablet 0  . prochlorperazine (COMPAZINE) 10 MG tablet Take 1 tablet (10 mg total) by mouth every 6 (six) hours as needed (Nausea or vomiting). 30 tablet 1  . tolterodine (DETROL LA) 2 MG 24 hr capsule Take 1 capsule (2 mg total) by mouth daily. 60 capsule 1  . traMADol (ULTRAM) 50 MG tablet Take 1 tablet (50 mg total) by mouth every 6 (six) hours as needed. 20 tablet 0   No current facility-administered medications for this visit.    PHYSICAL EXAMINATION: ECOG PERFORMANCE STATUS: 1 - Symptomatic but completely ambulatory  Vitals:   11/03/20 1023  BP: 125/85  Pulse: 63  Resp: 18  Temp: (!) 97.5 F (36.4 C)  SpO2: 95%   Filed Weights   11/03/20 1023  Weight: (!) 392 lb 8 oz (178 kg)    LABORATORY DATA:  I have reviewed the data as listed CMP Latest Ref Rng & Units 11/03/2020 10/09/2020 09/21/2020  Glucose 70 - 99 mg/dL 119(H) 112(H) 93  BUN 6 - 20 mg/dL 12 16 11   Creatinine 0.44 - 1.00 mg/dL 0.71 0.75 0.68  Sodium 135 - 145 mmol/L 141 140 142  Potassium 3.5 - 5.1 mmol/L 4.1 4.2 3.9  Chloride 98 - 111 mmol/L 108 105 107  CO2 22 - 32 mmol/L 24 21(L) 26  Calcium 8.9 - 10.3 mg/dL 9.1 9.7 8.6(L)  Total Protein 6.5 - 8.1 g/dL 7.2 7.5 6.6  Total Bilirubin 0.3 - 1.2 mg/dL 0.4 0.3 0.8  Alkaline Phos 38 - 126 U/L 82 100 98  AST 15 - 41 U/L 13(L) 11(L) 19  ALT 0 - 44 U/L 17 17 34    Lab Results  Component Value Date   WBC 11.1 (H) 11/03/2020   HGB 12.5 11/03/2020   HCT 37.4 11/03/2020   MCV 91.0 11/03/2020   PLT 312 11/03/2020   NEUTROABS 8.3 (H) 11/03/2020    ASSESSMENT & PLAN:  Malignant neoplasm of central portion of right breast in female, estrogen receptor positive ([place]) 07/04/2020:Screening mammogram detected right breast mass and right axillary lymph nodes. Right breast nipple retraction and retroareolar thickening. 2.4 cm mass with 2  abnormal lymph nodes. Biopsy revealed invasive lobular cancer grade 1-2 ER 95%, PR 95%, Ki-67 30%, HER2 equivocal by IHC negative by FISH ratio 1.39 T2N1 stageIIA  Right lumpectomy: 08/09/2020:ILC 3.8 cm, 19/19 LN Positive, Margins Neg  Plan: 1. Staging scans 2. Adjuvant chemo with TC X 6(because of her propensity for diabetes she may only end up getting 4) 3. XRT 4. Adj Anti estrogen therapy(with Verzenio) ------------------------------------------------------------------------------------------------------------------------------------ 08/30/2020: CT CAP:No evidence of metastatic disease 08/30/2020: Bone scan:No evidence of bone metastases -------------------------------------------------------------------------------------------------------------------- Current treatment: Cycle3Taxotere and Cytoxan Chemo Toxicities: 1. Severe bone pain due to Neulasta: Instructed her to take Ultram next cycle. Denies any nausea vomiting or  diarrhea or constipation. 2. severe fatigue related to chemotherapy  Return to clinic in3 weeks for cycle 4  I will arrange for cycle 5 of Taxotere and Cytoxan.  Mentally she thinks she can only handle 5 treatments.  No orders of the defined types were placed in this encounter.  The patient has a good understanding of the overall plan. she agrees with it. she will call with any problems that may develop before the next visit here.  Total time spent: 30 mins including face to face time and time spent for planning, charting and coordination of care  Rulon Eisenmenger, MD, MPH 11/03/2020  I, Cloyde Reams Dorshimer, am acting as scribe for Dr. Nicholas Lose.  I have reviewed the above documentation for accuracy and completeness, and I agree with the above.

## 2020-11-02 NOTE — Assessment & Plan Note (Signed)
07/04/2020:Screening mammogram detected right breast mass and right axillary lymph nodes. Right breast nipple retraction and retroareolar thickening. 2.4 cm mass with 2 abnormal lymph nodes. Biopsy revealed invasive lobular cancer grade 1-2 ER 95%, PR 95%, Ki-67 30%, HER2 equivocal by IHC negative by FISH ratio 1.39 T2N1 stageIIA  Right lumpectomy: 08/09/2020:ILC 3.8 cm, 19/19 LN Positive, Margins Neg  Plan: 1. Staging scans 2. Adjuvant chemo with TC X 6(because of her propensity for diabetes she may only end up getting 4) 3. XRT 4. Adj Anti estrogen therapy(with Verzenio) ------------------------------------------------------------------------------------------------------------------------------------ 08/30/2020: CT CAP:No evidence of metastatic disease 08/30/2020: Bone scan:No evidence of bone metastases -------------------------------------------------------------------------------------------------------------------- Current treatment: Cycle3Taxotere and Cytoxan Chemo Toxicities: 1. Severe bone pain due to Neulasta: Instructed her to take Ultram next cycle. Denies any nausea vomiting or diarrhea or constipation.  Return to clinic in3 weeks for cycle 4

## 2020-11-03 ENCOUNTER — Inpatient Hospital Stay: Payer: BC Managed Care – PPO | Attending: Hematology and Oncology

## 2020-11-03 ENCOUNTER — Inpatient Hospital Stay (HOSPITAL_BASED_OUTPATIENT_CLINIC_OR_DEPARTMENT_OTHER): Payer: BC Managed Care – PPO | Admitting: Hematology and Oncology

## 2020-11-03 ENCOUNTER — Inpatient Hospital Stay: Payer: BC Managed Care – PPO

## 2020-11-03 ENCOUNTER — Other Ambulatory Visit: Payer: BC Managed Care – PPO

## 2020-11-03 ENCOUNTER — Other Ambulatory Visit: Payer: Self-pay

## 2020-11-03 DIAGNOSIS — Z170 Estrogen receptor positive status [ER+]: Secondary | ICD-10-CM

## 2020-11-03 DIAGNOSIS — C50111 Malignant neoplasm of central portion of right female breast: Secondary | ICD-10-CM | POA: Insufficient documentation

## 2020-11-03 DIAGNOSIS — Z95828 Presence of other vascular implants and grafts: Secondary | ICD-10-CM

## 2020-11-03 DIAGNOSIS — Z5189 Encounter for other specified aftercare: Secondary | ICD-10-CM | POA: Insufficient documentation

## 2020-11-03 DIAGNOSIS — Z79899 Other long term (current) drug therapy: Secondary | ICD-10-CM | POA: Diagnosis not present

## 2020-11-03 DIAGNOSIS — Z5111 Encounter for antineoplastic chemotherapy: Secondary | ICD-10-CM | POA: Insufficient documentation

## 2020-11-03 LAB — CBC WITH DIFFERENTIAL (CANCER CENTER ONLY)
Abs Immature Granulocytes: 0.05 10*3/uL (ref 0.00–0.07)
Basophils Absolute: 0 10*3/uL (ref 0.0–0.1)
Basophils Relative: 0 %
Eosinophils Absolute: 0 10*3/uL (ref 0.0–0.5)
Eosinophils Relative: 0 %
HCT: 37.4 % (ref 36.0–46.0)
Hemoglobin: 12.5 g/dL (ref 12.0–15.0)
Immature Granulocytes: 0 %
Lymphocytes Relative: 20 %
Lymphs Abs: 2.2 10*3/uL (ref 0.7–4.0)
MCH: 30.4 pg (ref 26.0–34.0)
MCHC: 33.4 g/dL (ref 30.0–36.0)
MCV: 91 fL (ref 80.0–100.0)
Monocytes Absolute: 0.5 10*3/uL (ref 0.1–1.0)
Monocytes Relative: 5 %
Neutro Abs: 8.3 10*3/uL — ABNORMAL HIGH (ref 1.7–7.7)
Neutrophils Relative %: 75 %
Platelet Count: 312 10*3/uL (ref 150–400)
RBC: 4.11 MIL/uL (ref 3.87–5.11)
RDW: 16.3 % — ABNORMAL HIGH (ref 11.5–15.5)
WBC Count: 11.1 10*3/uL — ABNORMAL HIGH (ref 4.0–10.5)
nRBC: 0 % (ref 0.0–0.2)

## 2020-11-03 LAB — CMP (CANCER CENTER ONLY)
ALT: 17 U/L (ref 0–44)
AST: 13 U/L — ABNORMAL LOW (ref 15–41)
Albumin: 3.6 g/dL (ref 3.5–5.0)
Alkaline Phosphatase: 82 U/L (ref 38–126)
Anion gap: 9 (ref 5–15)
BUN: 12 mg/dL (ref 6–20)
CO2: 24 mmol/L (ref 22–32)
Calcium: 9.1 mg/dL (ref 8.9–10.3)
Chloride: 108 mmol/L (ref 98–111)
Creatinine: 0.71 mg/dL (ref 0.44–1.00)
GFR, Estimated: >60 mL/min (ref >60)
Glucose, Bld: 119 mg/dL — ABNORMAL HIGH (ref 70–99)
Potassium: 4.1 mmol/L (ref 3.5–5.1)
Sodium: 141 mmol/L (ref 135–145)
Total Bilirubin: 0.4 mg/dL (ref 0.3–1.2)
Total Protein: 7.2 g/dL (ref 6.5–8.1)

## 2020-11-03 MED ORDER — SODIUM CHLORIDE 0.9 % IV SOLN
70.0000 mg/m2 | Freq: Once | INTRAVENOUS | Status: AC
  Administered 2020-11-03: 200 mg via INTRAVENOUS
  Filled 2020-11-03: qty 20

## 2020-11-03 MED ORDER — PALONOSETRON HCL INJECTION 0.25 MG/5ML
0.2500 mg | Freq: Once | INTRAVENOUS | Status: AC
Start: 2020-11-03 — End: 2020-11-03
  Administered 2020-11-03: 0.25 mg via INTRAVENOUS

## 2020-11-03 MED ORDER — SODIUM CHLORIDE 0.9% FLUSH
10.0000 mL | INTRAVENOUS | Status: DC | PRN
  Administered 2020-11-03: 10 mL
  Filled 2020-11-03: qty 10

## 2020-11-03 MED ORDER — HEPARIN SOD (PORK) LOCK FLUSH 100 UNIT/ML IV SOLN
500.0000 [IU] | Freq: Once | INTRAVENOUS | Status: AC | PRN
  Administered 2020-11-03: 500 [IU]
  Filled 2020-11-03: qty 5

## 2020-11-03 MED ORDER — SODIUM CHLORIDE 0.9 % IV SOLN
10.0000 mg | Freq: Once | INTRAVENOUS | Status: AC
  Administered 2020-11-03: 10 mg via INTRAVENOUS
  Filled 2020-11-03: qty 10

## 2020-11-03 MED ORDER — SODIUM CHLORIDE 0.9% FLUSH
10.0000 mL | Freq: Once | INTRAVENOUS | Status: AC
  Administered 2020-11-03: 10 mL via INTRAVENOUS
  Filled 2020-11-03: qty 10

## 2020-11-03 MED ORDER — SODIUM CHLORIDE 0.9 % IV SOLN
500.0000 mg/m2 | Freq: Once | INTRAVENOUS | Status: AC
  Administered 2020-11-03: 1440 mg via INTRAVENOUS
  Filled 2020-11-03: qty 72

## 2020-11-03 MED ORDER — PALONOSETRON HCL INJECTION 0.25 MG/5ML
INTRAVENOUS | Status: AC
  Filled 2020-11-03: qty 5

## 2020-11-03 MED ORDER — SODIUM CHLORIDE 0.9 % IV SOLN
Freq: Once | INTRAVENOUS | Status: AC
Start: 2020-11-03 — End: 2020-11-03
  Filled 2020-11-03: qty 250

## 2020-11-03 NOTE — Patient Instructions (Signed)
[facility]  Discharge Instructions: Thank you for choosing [facility] to provide your oncology and hematology care.   If you have a lab appointment with the [facility], please go directly to the [facility] and check in at the registration area.   Wear comfortable clothing and clothing appropriate for easy access to any Portacath or PICC line.   We strive to give you quality time with your provider. You may need to reschedule your appointment if you arrive late (15 or more minutes).  Arriving late affects you and other patients whose appointments are after yours.  Also, if you miss three or more appointments without notifying the office, you may be dismissed from the clinic at the provider's discretion.      For prescription refill requests, have your pharmacy contact our office and allow 72 hours for refills to be completed.    Today you received the following chemotherapy and/or immunotherapy agents taxotere; cytoxin   To help prevent nausea and vomiting after your treatment, we encourage you to take your nausea medication as directed.  BELOW ARE SYMPTOMS THAT SHOULD BE REPORTED IMMEDIATELY: . *FEVER GREATER THAN 100.4 F (38 C) OR HIGHER . *CHILLS OR SWEATING . *NAUSEA AND VOMITING THAT IS NOT CONTROLLED WITH YOUR NAUSEA MEDICATION . *UNUSUAL SHORTNESS OF BREATH . *UNUSUAL BRUISING OR BLEEDING . *URINARY PROBLEMS (pain or burning when urinating, or frequent urination) . *BOWEL PROBLEMS (unusual diarrhea, constipation, pain near the anus) . TENDERNESS IN MOUTH AND THROAT WITH OR WITHOUT PRESENCE OF ULCERS (sore throat, sores in mouth, or a toothache) . UNUSUAL RASH, SWELLING OR PAIN  . UNUSUAL VAGINAL DISCHARGE OR ITCHING   Items with * indicate a potential emergency and should be followed up as soon as possible or go to the Emergency Department if any problems should occur.  Please show the CHEMOTHERAPY ALERT CARD or IMMUNOTHERAPY  ALERT CARD at check-in to the Emergency Department and triage nurse.  Should you have questions after your visit or need to cancel or reschedule your appointment, please contact [facility]  Dept: [phone]  and follow the prompts.  Office hours are 8:00 a.m. to 4:30 p.m. Monday - Friday. Please note that voicemails left after 4:00 p.m. may not be returned until the following business day.  We are closed weekends and major holidays. You have access to a nurse at all times for urgent questions. Please call the main number to the clinic Dept: [phone] and follow the prompts.   For any non-urgent questions, you may also contact your provider using MyChart. We now offer e-Visits for anyone 62 and older to request care online for non-urgent symptoms. For details visit [email or web address].   Also download the MyChart app! Go to the app store, search "MyChart", open the app, select [facility], and log in with your MyChart username and password.  Due to Covid, a mask is required upon entering the hospital/clinic. If you do not have a mask, one will be given to you upon arrival. For doctor visits, patients may have 1 support person aged 56 or older with them. For treatment visits, patients cannot have anyone with them due to current Covid guidelines and our immunocompromised population.

## 2020-11-06 ENCOUNTER — Inpatient Hospital Stay: Payer: BC Managed Care – PPO

## 2020-11-06 ENCOUNTER — Other Ambulatory Visit: Payer: Self-pay

## 2020-11-06 VITALS — BP 97/71 | HR 68 | Temp 98.6°F | Resp 18

## 2020-11-06 DIAGNOSIS — Z170 Estrogen receptor positive status [ER+]: Secondary | ICD-10-CM

## 2020-11-06 DIAGNOSIS — Z79899 Other long term (current) drug therapy: Secondary | ICD-10-CM | POA: Diagnosis not present

## 2020-11-06 DIAGNOSIS — C50111 Malignant neoplasm of central portion of right female breast: Secondary | ICD-10-CM

## 2020-11-06 DIAGNOSIS — Z5189 Encounter for other specified aftercare: Secondary | ICD-10-CM | POA: Diagnosis not present

## 2020-11-06 DIAGNOSIS — Z5111 Encounter for antineoplastic chemotherapy: Secondary | ICD-10-CM | POA: Diagnosis not present

## 2020-11-06 MED ORDER — PEGFILGRASTIM-CBQV 6 MG/0.6ML ~~LOC~~ SOSY
6.0000 mg | PREFILLED_SYRINGE | Freq: Once | SUBCUTANEOUS | Status: AC
  Administered 2020-11-06: 6 mg via SUBCUTANEOUS

## 2020-11-06 MED ORDER — PEGFILGRASTIM-CBQV 6 MG/0.6ML ~~LOC~~ SOSY
PREFILLED_SYRINGE | SUBCUTANEOUS | Status: AC
  Filled 2020-11-06: qty 0.6

## 2020-11-06 NOTE — Patient Instructions (Signed)
Pegfilgrastim injection What is this medicine? PEGFILGRASTIM (PEG fil gra stim) is a long-acting granulocyte colony-stimulating factor that stimulates the growth of neutrophils, a type of white blood cell important in the body's fight against infection. It is used to reduce the incidence of fever and infection in patients with certain types of cancer who are receiving chemotherapy that affects the bone marrow, and to increase survival after being exposed to high doses of radiation. This medicine may be used for other purposes; ask your health care provider or pharmacist if you have questions. COMMON BRAND NAME(S): Fulphila, Neulasta, Nyvepria, UDENYCA, Ziextenzo What should I tell my health care provider before I take this medicine? They need to know if you have any of these conditions:  kidney disease  latex allergy  ongoing radiation therapy  sickle cell disease  skin reactions to acrylic adhesives (On-Body Injector only)  an unusual or allergic reaction to pegfilgrastim, filgrastim, other medicines, foods, dyes, or preservatives  pregnant or trying to get pregnant  breast-feeding How should I use this medicine? This medicine is for injection under the skin. If you get this medicine at home, you will be taught how to prepare and give the pre-filled syringe or how to use the On-body Injector. Refer to the patient Instructions for Use for detailed instructions. Use exactly as directed. Tell your healthcare provider immediately if you suspect that the On-body Injector may not have performed as intended or if you suspect the use of the On-body Injector resulted in a missed or partial dose. It is important that you put your used needles and syringes in a special sharps container. Do not put them in a trash can. If you do not have a sharps container, call your pharmacist or healthcare provider to get one. Talk to your pediatrician regarding the use of this medicine in children. While this drug  may be prescribed for selected conditions, precautions do apply. Overdosage: If you think you have taken too much of this medicine contact a poison control center or emergency room at once. NOTE: This medicine is only for you. Do not share this medicine with others. What if I miss a dose? It is important not to miss your dose. Call your doctor or health care professional if you miss your dose. If you miss a dose due to an On-body Injector failure or leakage, a new dose should be administered as soon as possible using a single prefilled syringe for manual use. What may interact with this medicine? Interactions have not been studied. This list may not describe all possible interactions. Give your health care provider a list of all the medicines, herbs, non-prescription drugs, or dietary supplements you use. Also tell them if you smoke, drink alcohol, or use illegal drugs. Some items may interact with your medicine. What should I watch for while using this medicine? Your condition will be monitored carefully while you are receiving this medicine. You may need blood work done while you are taking this medicine. Talk to your health care provider about your risk of cancer. You may be more at risk for certain types of cancer if you take this medicine. If you are going to need a MRI, CT scan, or other procedure, tell your doctor that you are using this medicine (On-Body Injector only). What side effects may I notice from receiving this medicine? Side effects that you should report to your doctor or health care professional as soon as possible:  allergic reactions (skin rash, itching or hives, swelling of   the face, lips, or tongue)  back pain  dizziness  fever  pain, redness, or irritation at site where injected  pinpoint red spots on the skin  red or dark-brown urine  shortness of breath or breathing problems  stomach or side pain, or pain at the shoulder  swelling  tiredness  trouble  passing urine or change in the amount of urine  unusual bruising or bleeding Side effects that usually do not require medical attention (report to your doctor or health care professional if they continue or are bothersome):  bone pain  muscle pain This list may not describe all possible side effects. Call your doctor for medical advice about side effects. You may report side effects to FDA at 1-800-FDA-1088. Where should I keep my medicine? Keep out of the reach of children. If you are using this medicine at home, you will be instructed on how to store it. Throw away any unused medicine after the expiration date on the label. NOTE: This sheet is a summary. It may not cover all possible information. If you have questions about this medicine, talk to your doctor, pharmacist, or health care provider.  2021 Elsevier/Gold Standard (2019-07-09 13:20:51)  

## 2020-11-14 ENCOUNTER — Telehealth (INDEPENDENT_AMBULATORY_CARE_PROVIDER_SITE_OTHER): Payer: BC Managed Care – PPO | Admitting: Nurse Practitioner

## 2020-11-14 ENCOUNTER — Encounter: Payer: Self-pay | Admitting: Nurse Practitioner

## 2020-11-14 DIAGNOSIS — Z5189 Encounter for other specified aftercare: Secondary | ICD-10-CM | POA: Diagnosis not present

## 2020-11-14 DIAGNOSIS — K219 Gastro-esophageal reflux disease without esophagitis: Secondary | ICD-10-CM

## 2020-11-14 DIAGNOSIS — R7303 Prediabetes: Secondary | ICD-10-CM

## 2020-11-14 MED ORDER — NYSTATIN 100000 UNIT/GM EX POWD: 1.0000 "application " | Freq: Two times a day (BID) | CUTANEOUS | 4 refills | Status: DC

## 2020-11-14 MED ORDER — MUPIROCIN CALCIUM 2 % EX CREA: 1.0000 "application " | TOPICAL_CREAM | Freq: Two times a day (BID) | CUTANEOUS | 2 refills | Status: DC

## 2020-11-14 MED ORDER — PANTOPRAZOLE SODIUM 40 MG PO TBEC: 40.0000 mg | DELAYED_RELEASE_TABLET | Freq: Every day | ORAL | 2 refills | Status: DC

## 2020-11-14 MED ORDER — METFORMIN HCL 500 MG PO TABS: 500.0000 mg | ORAL_TABLET | Freq: Two times a day (BID) | ORAL | 0 refills | Status: DC

## 2020-11-14 NOTE — Assessment & Plan Note (Signed)
Wound is healing as expected.  Refilled mupirocin and nystatin powder.  Rx sent to pharmacy.

## 2020-11-14 NOTE — Progress Notes (Signed)
Virtual Visit via Video Note   This visit type was conducted due to national recommendations for restrictions regarding the COVID-19 Pandemic (e.g. social distancing) in an effort to limit this patient's exposure and mitigate transmission in our community.  Due to her co-morbid illnesses, this patient is at least at moderate risk for complications without adequate follow up.  This format is felt to be most appropriate for this patient at this time.  All issues noted in this document were discussed and addressed.  A limited physical exam was performed with this format.  A verbal consent was obtained for the virtual visit.   Date:  11/14/2020   ID:  Shelby [name], DOB Apr 19, 1971, MRN [id number]  Patient Location: Home Provider Location: Office/Clinic  PCP:  [name], NP   Evaluation Performed:  Follow-Up Visit  Chief Complaint: Chronic disease management  History of Present Illness:    Shelby [name] is a 50 y.o. female with GERD, Follow up:  The patient was last seen for GERD 5 years ago. Changes made since that visit include Protonix 40 mg tablet by mouth daily..  She reports good compliance with treatment. She is not having side effects. .  . She is NOT experiencing abdominal bloating, belching, belching and eructation, bilious reflux, chest pain, choking on food, cough or deep pressure at base of neck  The patient presents with history of pre diabetes mellitus without complications. Patient was diagnosed few months ago. Compliance with treatment has been good; the patient takes medication as directed , maintains a healthy diet and follows up as directed , and is keeping a glucose diary. Sugars runs 112-140. Patient specifically denies associated symptoms, including blurred vision, fatigue, polydipsia, polyphagia and polyuria . Patient denies hypoglycemia.    Wound Check: Patient presents for wound check. Patient has a pressure wound which is located on the abdomen. Current  symptoms: wound healing as expected. Symptoms began a few months ago. Pain is rated 0/10. Interventions to date:  Nystatin powder, mupirocin.   The patient does not have symptoms concerning for COVID-19 infection (fever, chills, cough, or new shortness of breath).    Past Medical History:  Diagnosis Date  . Cancer ([place])    right breast  . Complication of anesthesia    hard to wake up  per pt  . Diabetes mellitus without complication ([place])   . Dyspnea   . Heart palpitations   . Obesity     Past Surgical History:  Procedure Laterality Date  . BREAST LUMPECTOMY WITH RADIOACTIVE SEED AND SENTINEL LYMPH NODE BIOPSY Right 08/09/2020   Procedure: RIGHT BREAST LUMPECTOMY WITH RADIOACTIVE SEED AND RIGHT BREAST RADIOACTIVE SEED TARGETED SENTINEL LYMPH NODE BIOPSY, RIGHT BREAST SENTINEL LYMPH NODE MAPPING;  Surgeon: Erroll Luna, MD;  Location: [facility];  Service: General;  Laterality: Right;  PEC BLOCK  . CHOLECYSTECTOMY    . HERNIA REPAIR    . IR IMAGING GUIDED PORT INSERTION  08/29/2020  . IR IMAGING GUIDED PORT INSERTION  08/29/2020  . lumpectomy right breast      Family History  Adopted: Yes    Social History   Socioeconomic History  . Marital status: Married    Spouse name: Not on file  . Number of children: Not on file  . Years of education: Not on file  . Highest education level: Not on file  Occupational History  . Not on file  Tobacco Use  . Smoking status: Never Smoker  . Smokeless tobacco: Never Used  Vaping Use  . Vaping Use: Never used  Substance and Sexual Activity  . Alcohol use: No  . Drug use: No  . Sexual activity: Yes    Partners: Male    Birth control/protection: Pill  Other Topics Concern  . Not on file  Social History Narrative  . Not on file   Social Determinants of Health   Financial Resource Strain: Not on file  Food Insecurity: Not on file  Transportation Needs: Not on file  Physical Activity: Not on file  Stress: Not on file  Social  Connections: Not on file  Intimate Partner Violence: Not on file    Outpatient Medications Prior to Visit  Medication Sig Dispense Refill  . ALPRAZolam (XANAX) 0.5 MG tablet Take 1 tablet (0.5 mg total) by mouth at bedtime as needed for anxiety. 40 tablet 0  . buPROPion (WELLBUTRIN XL) 150 MG 24 hr tablet Take 1 tablet (150 mg total) by mouth daily. 60 tablet 0  . cetirizine (ZYRTEC) 10 MG tablet Take 1 tablet (10 mg total) by mouth daily. 30 tablet 7  . citalopram (CELEXA) 20 MG tablet Take 1 tablet (20 mg total) by mouth daily. (Needs to be seen before next refill) (Patient not taking: Reported on 07/31/2020) 30 tablet 11  . dexamethasone (DECADRON) 4 MG tablet Take 2 tablets (8 mg total) by mouth daily. Take 1 tablet day before chemo and 1 tablet day after chemo with food 12 tablet 0  . fluconazole (DIFLUCAN) 150 MG tablet Take 1 tablet (150 mg total) by mouth every three (3) days as needed. 3 tablet 0  . fluticasone (FLONASE) 50 MCG/ACT nasal spray Place 2 sprays into both nostrils daily. 16 g 6  . ibuprofen (ADVIL) 800 MG tablet Take 1 tablet (800 mg total) by mouth every 8 (eight) hours as needed. 30 tablet 0  . lidocaine-prilocaine (EMLA) cream Apply to affected area once 30 g 3  . loratadine (CLARITIN) 10 MG tablet Take 1 tablet (10 mg total) by mouth daily. 30 tablet 11  . metoprolol tartrate (LOPRESSOR) 50 MG tablet Take 1 tablet (50 mg total) by mouth 2 (two) times daily. 60 tablet 6  . ondansetron (ZOFRAN) 8 MG tablet Take 1 tablet (8 mg total) by mouth 2 (two) times daily as needed for refractory nausea / vomiting. Start on day 3 after chemo. 30 tablet 1  . prochlorperazine (COMPAZINE) 10 MG tablet Take 1 tablet (10 mg total) by mouth every 6 (six) hours as needed (Nausea or vomiting). 30 tablet 1  . tolterodine (DETROL LA) 2 MG 24 hr capsule Take 1 capsule (2 mg total) by mouth daily. 60 capsule 1  . traMADol (ULTRAM) 50 MG tablet Take 1 tablet (50 mg total) by mouth every 6 (six)  hours as needed. 20 tablet 0  . metFORMIN (GLUCOPHAGE) 500 MG tablet Take 1 tablet (500 mg total) by mouth 2 (two) times daily with a meal. 90 tablet 0  . mupirocin cream (BACTROBAN) 2 % Apply 1 application topically 2 (two) times daily. 15 g 0  . nystatin (MYCOSTATIN/NYSTOP) powder Apply 1 application topically 2 (two) times daily. 15 g 2  . pantoprazole (PROTONIX) 40 MG tablet Take 1 tablet (40 mg total) by mouth daily. 30 tablet 0   No facility-administered medications prior to visit.    Allergies:   Codeine and Peach flavor   Social History   Tobacco Use  . Smoking status: Never Smoker  . Smokeless tobacco: Never Used  Vaping Use  .  Vaping Use: Never used  Substance Use Topics  . Alcohol use: No  . Drug use: No     Review of Systems  Constitutional: Negative.   HENT: Negative.   Respiratory: Negative.   Cardiovascular: Negative.   Gastrointestinal: Negative.   Genitourinary: Negative.   Skin: Negative for rash.  All other systems reviewed and are negative.    Labs/Other Tests and Data Reviewed:    Recent Labs: 11/03/2020: ALT 17; BUN 12; Creatinine 0.71; Hemoglobin 12.5; Platelet Count 312; Potassium 4.1; Sodium 141   Recent Lipid Panel Lab Results  Component Value Date/Time   CHOL 154 07/11/2020 12:32 PM   TRIG 184 (H) 07/11/2020 12:32 PM   HDL 31 (L) 07/11/2020 12:32 PM   CHOLHDL 5.0 (H) 07/11/2020 12:32 PM   LDLCALC 91 07/11/2020 12:32 PM    Wt Readings from Last 3 Encounters:  11/03/20 (!) 392 lb 8 oz (178 kg)  10/09/20 (!) 393 lb 4.8 oz (178.4 kg)  09/21/20 (!) 394 lb 11.2 oz (179 kg)     Objective:    Vital Signs:  There were no vitals taken for this visit.  Virtual visit. Physical Exam Constitutional:      Appearance: Normal appearance.  HENT:     Head: Normocephalic.  Neurological:     Mental Status: She is alert.      ASSESSMENT & PLAN:   1. Prediabetes - metFORMIN (GLUCOPHAGE) 500 MG tablet; Take 1 tablet (500 mg total) by mouth 2  (two) times daily with a meal.  Dispense: 90 tablet; Refill: 0  2. Wound check, abscess - nystatin (MYCOSTATIN/NYSTOP) powder; Apply 1 application topically 2 (two) times daily.  Dispense: 15 g; Refill: 4  3. Gastroesophageal reflux disease without esophagitis - pantoprazole (PROTONIX) 40 MG tablet; Take 1 tablet (40 mg total) by mouth daily.  Dispense: 30 tablet; Refill: 2    No orders of the defined types were placed in this encounter.  Wound check, abscess Wound is healing as expected.  Refilled mupirocin and nystatin powder.  Rx sent to pharmacy.  Gastroesophageal reflux disease without esophagitis GERD symptoms well managed on 40 mg Protonix daily.  Rx refill sent to pharmacy.   Meds ordered this encounter  Medications  . metFORMIN (GLUCOPHAGE) 500 MG tablet    Sig: Take 1 tablet (500 mg total) by mouth 2 (two) times daily with a meal.    Dispense:  90 tablet    Refill:  0    Order Specific Question:   Supervising Provider    Answer:   Janora Norlander [[id number]]  . nystatin (MYCOSTATIN/NYSTOP) powder    Sig: Apply 1 application topically 2 (two) times daily.    Dispense:  15 g    Refill:  4    Order Specific Question:   Supervising Provider    Answer:   Janora Norlander [[id number]]  . mupirocin cream (BACTROBAN) 2 %    Sig: Apply 1 application topically 2 (two) times daily.    Dispense:  15 g    Refill:  2    Order Specific Question:   Supervising Provider    Answer:   Janora Norlander [[id number]]  . pantoprazole (PROTONIX) 40 MG tablet    Sig: Take 1 tablet (40 mg total) by mouth daily.    Dispense:  30 tablet    Refill:  2    Order Specific Question:   Supervising Provider    Answer:   Janora Norlander [[id number]]    [id number]  Education: The signs and symptoms of COVID-19 were discussed with the patient and how to seek care for testing (follow up with PCP or arrange E-visit). The importance of social distancing was discussed today.  Time:   Today, I have  spent 15 minutes with the patient with telehealth technology discussing the above problems.    Follow Up:  Virtual Visit  3 months  Signed, [name], NP  11/14/2020 6:43 PM    [place]

## 2020-11-14 NOTE — Assessment & Plan Note (Signed)
GERD symptoms well managed on 40 mg Protonix daily.  Rx refill sent to pharmacy.

## 2020-11-24 ENCOUNTER — Inpatient Hospital Stay: Payer: BC Managed Care – PPO | Admitting: Hematology and Oncology

## 2020-11-24 ENCOUNTER — Inpatient Hospital Stay: Payer: BC Managed Care – PPO

## 2020-11-24 ENCOUNTER — Encounter: Payer: Self-pay | Admitting: *Deleted

## 2020-11-24 ENCOUNTER — Other Ambulatory Visit: Payer: BC Managed Care – PPO

## 2020-11-24 ENCOUNTER — Other Ambulatory Visit: Payer: Self-pay

## 2020-11-24 DIAGNOSIS — Z5189 Encounter for other specified aftercare: Secondary | ICD-10-CM | POA: Diagnosis not present

## 2020-11-24 DIAGNOSIS — Z79899 Other long term (current) drug therapy: Secondary | ICD-10-CM | POA: Diagnosis not present

## 2020-11-24 DIAGNOSIS — Z170 Estrogen receptor positive status [ER+]: Secondary | ICD-10-CM

## 2020-11-24 DIAGNOSIS — C50111 Malignant neoplasm of central portion of right female breast: Secondary | ICD-10-CM

## 2020-11-24 DIAGNOSIS — Z5111 Encounter for antineoplastic chemotherapy: Secondary | ICD-10-CM | POA: Diagnosis not present

## 2020-11-24 LAB — CBC WITH DIFFERENTIAL (CANCER CENTER ONLY)
Abs Immature Granulocytes: 0.06 10*3/uL (ref 0.00–0.07)
Basophils Absolute: 0.1 10*3/uL (ref 0.0–0.1)
Basophils Relative: 0 %
Eosinophils Absolute: 0 10*3/uL (ref 0.0–0.5)
Eosinophils Relative: 0 %
HCT: 36.1 % (ref 36.0–46.0)
Hemoglobin: 12 g/dL (ref 12.0–15.0)
Immature Granulocytes: 1 %
Lymphocytes Relative: 22 %
Lymphs Abs: 2.6 10*3/uL (ref 0.7–4.0)
MCH: 30.7 pg (ref 26.0–34.0)
MCHC: 33.2 g/dL (ref 30.0–36.0)
MCV: 92.3 fL (ref 80.0–100.0)
Monocytes Absolute: 0.7 10*3/uL (ref 0.1–1.0)
Monocytes Relative: 6 %
Neutro Abs: 8.5 10*3/uL — ABNORMAL HIGH (ref 1.7–7.7)
Neutrophils Relative %: 71 %
Platelet Count: 312 10*3/uL (ref 150–400)
RBC: 3.91 MIL/uL (ref 3.87–5.11)
RDW: 17.7 % — ABNORMAL HIGH (ref 11.5–15.5)
WBC Count: 11.9 10*3/uL — ABNORMAL HIGH (ref 4.0–10.5)
nRBC: 0 % (ref 0.0–0.2)

## 2020-11-24 LAB — CMP (CANCER CENTER ONLY)
ALT: 17 U/L (ref 0–44)
AST: 13 U/L — ABNORMAL LOW (ref 15–41)
Albumin: 3.7 g/dL (ref 3.5–5.0)
Alkaline Phosphatase: 83 U/L (ref 38–126)
Anion gap: 12 (ref 5–15)
BUN: 14 mg/dL (ref 6–20)
CO2: 23 mmol/L (ref 22–32)
Calcium: 9.3 mg/dL (ref 8.9–10.3)
Chloride: 107 mmol/L (ref 98–111)
Creatinine: 0.74 mg/dL (ref 0.44–1.00)
GFR, Estimated: >60 mL/min (ref >60)
Glucose, Bld: 112 mg/dL — ABNORMAL HIGH (ref 70–99)
Potassium: 3.9 mmol/L (ref 3.5–5.1)
Sodium: 142 mmol/L (ref 135–145)
Total Bilirubin: 0.5 mg/dL (ref 0.3–1.2)
Total Protein: 7.1 g/dL (ref 6.5–8.1)

## 2020-11-24 MED ORDER — PALONOSETRON HCL INJECTION 0.25 MG/5ML
INTRAVENOUS | Status: AC
  Filled 2020-11-24: qty 5

## 2020-11-24 MED ORDER — CYCLOPHOSPHAMIDE CHEMO INJECTION 1 GM
500.0000 mg/m2 | Freq: Once | INTRAMUSCULAR | Status: AC
  Administered 2020-11-24: 1440 mg via INTRAVENOUS
  Filled 2020-11-24: qty 72

## 2020-11-24 MED ORDER — SODIUM CHLORIDE 0.9 % IV SOLN
10.0000 mg | Freq: Once | INTRAVENOUS | Status: AC
  Administered 2020-11-24: 10 mg via INTRAVENOUS
  Filled 2020-11-24: qty 10

## 2020-11-24 MED ORDER — PALONOSETRON HCL INJECTION 0.25 MG/5ML
0.2500 mg | Freq: Once | INTRAVENOUS | Status: AC
  Administered 2020-11-24: 0.25 mg via INTRAVENOUS

## 2020-11-24 MED ORDER — HEPARIN SOD (PORK) LOCK FLUSH 100 UNIT/ML IV SOLN
500.0000 [IU] | Freq: Once | INTRAVENOUS | Status: AC | PRN
  Administered 2020-11-24: 500 [IU]
  Filled 2020-11-24: qty 5

## 2020-11-24 MED ORDER — SODIUM CHLORIDE 0.9 % IV SOLN
70.0000 mg/m2 | Freq: Once | INTRAVENOUS | Status: AC
  Administered 2020-11-24: 200 mg via INTRAVENOUS
  Filled 2020-11-24: qty 20

## 2020-11-24 MED ORDER — SODIUM CHLORIDE 0.9% FLUSH
10.0000 mL | INTRAVENOUS | Status: DC | PRN
  Administered 2020-11-24: 10 mL
  Filled 2020-11-24: qty 10

## 2020-11-24 MED ORDER — SODIUM CHLORIDE 0.9 % IV SOLN
Freq: Once | INTRAVENOUS | Status: AC
  Filled 2020-11-24: qty 250

## 2020-11-24 NOTE — Assessment & Plan Note (Signed)
07/04/2020:Screening mammogram detected right breast mass and right axillary lymph nodes. Right breast nipple retraction and retroareolar thickening. 2.4 cm mass with 2 abnormal lymph nodes. Biopsy revealed invasive lobular cancer grade 1-2 ER 95%, PR 95%, Ki-67 30%, HER2 equivocal by IHC negative by FISH ratio 1.39 T2N1 stageIIA  Right lumpectomy: 08/09/2020:ILC 3.8 cm, 19/19 LN Positive, Margins Neg  Plan: 1. Staging scans 2. Adjuvant chemo with TC X 6(because of her propensity for diabetes she may only end up getting 4) 3. XRT 4. Adj Anti estrogen therapy(with Verzenio) ------------------------------------------------------------------------------------------------------------------------------------ 08/30/2020: CT CAP:No evidence of metastatic disease 08/30/2020: Bone scan:No evidence of bone metastases -------------------------------------------------------------------------------------------------------------------- Current treatment: Cycle4Taxotere and Cytoxan Chemo Toxicities: 1.Severe bone pain due to Neulasta: Instructed her to take Ultram next cycle. Denies any nausea vomiting or diarrhea or constipation. 2. severe fatigue related to chemotherapy  Return to clinic in3weeks for cycle 5  Mentally she thinks she can only handle 5 treatments.

## 2020-11-24 NOTE — Progress Notes (Signed)
Patient Care Team: [name], NP as PCP - General (Nurse Practitioner) Mauro Kaufmann, RN as Oncology Nurse Navigator Rockwell Germany, RN as Oncology Nurse Navigator  DIAGNOSIS:  Encounter Diagnosis  Name Primary?  . Malignant neoplasm of central portion of right breast in female, estrogen receptor positive ([place])     SUMMARY OF ONCOLOGIC HISTORY: Oncology History  Malignant neoplasm of central portion of right breast in female, estrogen receptor positive ([place])  07/04/2020 Initial Diagnosis   Screening mammogram detected right breast mass and right axillary lymph nodes.  Right breast nipple retraction and retroareolar thickening.  2.4 cm mass with 2 abnormal lymph nodes.  Biopsy revealed invasive lobular cancer grade 1-2 ER 95%, PR 95%, Ki-67 30%, HER2 equivocal by IHC negative by FISH ratio 1.39   07/17/2020 Cancer Staging   Staging form: Breast, AJCC 8th Edition - Clinical stage from 07/17/2020: Stage IIA (cT2, cN1, cM0, G2, ER+, PR+, HER2-) - Signed by Nicholas Lose, MD on 07/17/2020   08/09/2020 Surgery   Rght lumpectomy ([place]): invasive lobular carcinoma, 3.8cm, clear margins, 19/19 right axillary lymph nodes positive for metastatic carcinoma.    08/09/2020 Cancer Staging   Staging form: Breast, AJCC 8th Edition - Pathologic stage from 08/09/2020: Stage IIIA (pT2, pN3a, cM0, G2, ER+, PR+, HER2-) - Signed by Gardenia Phlegm, NP on 08/23/2020 Stage prefix: Initial diagnosis Histologic grading system: 3 grade system   09/15/2020 -  Chemotherapy    Patient is on Treatment Plan: BREAST TC Q21D        CHIEF COMPLIANT: Cycle 4 Taxotere and Cytoxan  INTERVAL HISTORY: Shelby [name] is a 50 year old with above-mentioned history of right breast cancer who is currently on adjuvant chemotherapy with Taxotere and Cytoxan.  Today cycle 4 of treatment.  She will complete her chemotherapy after this round.  After last cycle of chemotherapy she did extremely well with very limited  nausea vomiting.  Bone pain is quite manageable.  She is tolerating chemo extremely well.  Does not have any nausea or vomiting.   ALLERGIES:  is allergic to codeine and peach flavor.  MEDICATIONS:  Current Outpatient Medications  Medication Sig Dispense Refill  . ALPRAZolam (XANAX) 0.5 MG tablet Take 1 tablet (0.5 mg total) by mouth at bedtime as needed for anxiety. 40 tablet 0  . buPROPion (WELLBUTRIN XL) 150 MG 24 hr tablet Take 1 tablet (150 mg total) by mouth daily. 60 tablet 0  . cetirizine (ZYRTEC) 10 MG tablet Take 1 tablet (10 mg total) by mouth daily. 30 tablet 7  . citalopram (CELEXA) 20 MG tablet Take 1 tablet (20 mg total) by mouth daily. (Needs to be seen before next refill) (Patient not taking: Reported on 07/31/2020) 30 tablet 11  . dexamethasone (DECADRON) 4 MG tablet Take 2 tablets (8 mg total) by mouth daily. Take 1 tablet day before chemo and 1 tablet day after chemo with food 12 tablet 0  . fluconazole (DIFLUCAN) 150 MG tablet Take 1 tablet (150 mg total) by mouth every three (3) days as needed. 3 tablet 0  . fluticasone (FLONASE) 50 MCG/ACT nasal spray Place 2 sprays into both nostrils daily. 16 g 6  . ibuprofen (ADVIL) 800 MG tablet Take 1 tablet (800 mg total) by mouth every 8 (eight) hours as needed. 30 tablet 0  . lidocaine-prilocaine (EMLA) cream Apply to affected area once 30 g 3  . loratadine (CLARITIN) 10 MG tablet Take 1 tablet (10 mg total) by mouth daily. 30 tablet  11  . metFORMIN (GLUCOPHAGE) 500 MG tablet Take 1 tablet (500 mg total) by mouth 2 (two) times daily with a meal. 90 tablet 0  . metoprolol tartrate (LOPRESSOR) 50 MG tablet Take 1 tablet (50 mg total) by mouth 2 (two) times daily. 60 tablet 6  . mupirocin cream (BACTROBAN) 2 % Apply 1 application topically 2 (two) times daily. 15 g 2  . nystatin (MYCOSTATIN/NYSTOP) powder Apply 1 application topically 2 (two) times daily. 15 g 4  . ondansetron (ZOFRAN) 8 MG tablet Take 1 tablet (8 mg total) by mouth 2  (two) times daily as needed for refractory nausea / vomiting. Start on day 3 after chemo. 30 tablet 1  . pantoprazole (PROTONIX) 40 MG tablet Take 1 tablet (40 mg total) by mouth daily. 30 tablet 2  . prochlorperazine (COMPAZINE) 10 MG tablet Take 1 tablet (10 mg total) by mouth every 6 (six) hours as needed (Nausea or vomiting). 30 tablet 1  . tolterodine (DETROL LA) 2 MG 24 hr capsule Take 1 capsule (2 mg total) by mouth daily. 60 capsule 1  . traMADol (ULTRAM) 50 MG tablet Take 1 tablet (50 mg total) by mouth every 6 (six) hours as needed. 20 tablet 0   No current facility-administered medications for this visit.    PHYSICAL EXAMINATION: ECOG PERFORMANCE STATUS: 1 - Symptomatic but completely ambulatory  Vitals:   11/24/20 1035  BP: 123/74  Pulse: 74  Resp: 19  Temp: 97.7 F (36.5 C)  SpO2: 99%   Filed Weights   11/24/20 1035  Weight: (!) 396 lb 12.8 oz (180 kg)      LABORATORY DATA:  I have reviewed the data as listed CMP Latest Ref Rng & Units 11/03/2020 10/09/2020 09/21/2020  Glucose 70 - 99 mg/dL 119(H) 112(H) 93  BUN 6 - 20 mg/dL 12 16 11   Creatinine 0.44 - 1.00 mg/dL 0.71 0.75 0.68  Sodium 135 - 145 mmol/L 141 140 142  Potassium 3.5 - 5.1 mmol/L 4.1 4.2 3.9  Chloride 98 - 111 mmol/L 108 105 107  CO2 22 - 32 mmol/L 24 21(L) 26  Calcium 8.9 - 10.3 mg/dL 9.1 9.7 8.6(L)  Total Protein 6.5 - 8.1 g/dL 7.2 7.5 6.6  Total Bilirubin 0.3 - 1.2 mg/dL 0.4 0.3 0.8  Alkaline Phos 38 - 126 U/L 82 100 98  AST 15 - 41 U/L 13(L) 11(L) 19  ALT 0 - 44 U/L 17 17 34    Lab Results  Component Value Date   WBC 11.9 (H) 11/24/2020   HGB 12.0 11/24/2020   HCT 36.1 11/24/2020   MCV 92.3 11/24/2020   PLT 312 11/24/2020   NEUTROABS 8.5 (H) 11/24/2020    ASSESSMENT & PLAN:  Malignant neoplasm of central portion of right breast in female, estrogen receptor positive ([place]) 07/04/2020:Screening mammogram detected right breast mass and right axillary lymph nodes. Right breast nipple  retraction and retroareolar thickening. 2.4 cm mass with 2 abnormal lymph nodes. Biopsy revealed invasive lobular cancer grade 1-2 ER 95%, PR 95%, Ki-67 30%, HER2 equivocal by IHC negative by FISH ratio 1.39 T2N1 stageIIA  Right lumpectomy: 08/09/2020:ILC 3.8 cm, 19/19 LN Positive, Margins Neg  Plan: 1. Staging scans 2. Adjuvant chemo with TC X 6(because of her propensity for diabetes she may only end up getting 4) 3. XRT 4. Adj Anti estrogen therapy(with Verzenio) ------------------------------------------------------------------------------------------------------------------------------------ 08/30/2020: CT CAP:No evidence of metastatic disease 08/30/2020: Bone scan:No evidence of bone metastases -------------------------------------------------------------------------------------------------------------------- Current treatment: Cycle4Taxotere and Cytoxan Chemo Toxicities: 1.Severe bone  pain due to Neulasta: No further issues with bone pain Denies any nausea vomiting or diarrhea or constipation. 2.  fatigue: Manageable  Return to clinic in3weeks for cycle 5  She is agreeable to finish 6 cycles    No orders of the defined types were placed in this encounter.  The patient has a good understanding of the overall plan. she agrees with it. she will call with any problems that may develop before the next visit here. Total time spent: 30 mins including face to face time and time spent for planning, charting and co-ordination of care   Harriette Ohara, MD 11/24/20

## 2020-11-28 ENCOUNTER — Other Ambulatory Visit: Payer: Self-pay

## 2020-11-28 ENCOUNTER — Inpatient Hospital Stay: Payer: BC Managed Care – PPO

## 2020-11-28 VITALS — BP 120/69 | HR 75 | Temp 98.4°F | Resp 18

## 2020-11-28 DIAGNOSIS — Z170 Estrogen receptor positive status [ER+]: Secondary | ICD-10-CM

## 2020-11-28 DIAGNOSIS — C50111 Malignant neoplasm of central portion of right female breast: Secondary | ICD-10-CM

## 2020-11-28 DIAGNOSIS — Z5111 Encounter for antineoplastic chemotherapy: Secondary | ICD-10-CM | POA: Diagnosis not present

## 2020-11-28 DIAGNOSIS — Z5189 Encounter for other specified aftercare: Secondary | ICD-10-CM | POA: Diagnosis not present

## 2020-11-28 DIAGNOSIS — Z79899 Other long term (current) drug therapy: Secondary | ICD-10-CM | POA: Diagnosis not present

## 2020-11-28 MED ORDER — PEGFILGRASTIM-CBQV 6 MG/0.6ML ~~LOC~~ SOSY
PREFILLED_SYRINGE | SUBCUTANEOUS | Status: AC
  Filled 2020-11-28: qty 0.6

## 2020-11-28 MED ORDER — PEGFILGRASTIM-CBQV 6 MG/0.6ML ~~LOC~~ SOSY
6.0000 mg | PREFILLED_SYRINGE | Freq: Once | SUBCUTANEOUS | Status: AC
  Administered 2020-11-28: 6 mg via SUBCUTANEOUS

## 2020-11-28 NOTE — Patient Instructions (Signed)
Pegfilgrastim injection What is this medicine? PEGFILGRASTIM (PEG fil gra stim) is a long-acting granulocyte colony-stimulating factor that stimulates the growth of neutrophils, a type of white blood cell important in the body's fight against infection. It is used to reduce the incidence of fever and infection in patients with certain types of cancer who are receiving chemotherapy that affects the bone marrow, and to increase survival after being exposed to high doses of radiation. This medicine may be used for other purposes; ask your health care provider or pharmacist if you have questions. COMMON BRAND NAME(S): Fulphila, Neulasta, Nyvepria, UDENYCA, Ziextenzo What should I tell my health care provider before I take this medicine? They need to know if you have any of these conditions:  kidney disease  latex allergy  ongoing radiation therapy  sickle cell disease  skin reactions to acrylic adhesives (On-Body Injector only)  an unusual or allergic reaction to pegfilgrastim, filgrastim, other medicines, foods, dyes, or preservatives  pregnant or trying to get pregnant  breast-feeding How should I use this medicine? This medicine is for injection under the skin. If you get this medicine at home, you will be taught how to prepare and give the pre-filled syringe or how to use the On-body Injector. Refer to the patient Instructions for Use for detailed instructions. Use exactly as directed. Tell your healthcare provider immediately if you suspect that the On-body Injector may not have performed as intended or if you suspect the use of the On-body Injector resulted in a missed or partial dose. It is important that you put your used needles and syringes in a special sharps container. Do not put them in a trash can. If you do not have a sharps container, call your pharmacist or healthcare provider to get one. Talk to your pediatrician regarding the use of this medicine in children. While this drug  may be prescribed for selected conditions, precautions do apply. Overdosage: If you think you have taken too much of this medicine contact a poison control center or emergency room at once. NOTE: This medicine is only for you. Do not share this medicine with others. What if I miss a dose? It is important not to miss your dose. Call your doctor or health care professional if you miss your dose. If you miss a dose due to an On-body Injector failure or leakage, a new dose should be administered as soon as possible using a single prefilled syringe for manual use. What may interact with this medicine? Interactions have not been studied. This list may not describe all possible interactions. Give your health care provider a list of all the medicines, herbs, non-prescription drugs, or dietary supplements you use. Also tell them if you smoke, drink alcohol, or use illegal drugs. Some items may interact with your medicine. What should I watch for while using this medicine? Your condition will be monitored carefully while you are receiving this medicine. You may need blood work done while you are taking this medicine. Talk to your health care provider about your risk of cancer. You may be more at risk for certain types of cancer if you take this medicine. If you are going to need a MRI, CT scan, or other procedure, tell your doctor that you are using this medicine (On-Body Injector only). What side effects may I notice from receiving this medicine? Side effects that you should report to your doctor or health care professional as soon as possible:  allergic reactions (skin rash, itching or hives, swelling of   the face, lips, or tongue)  back pain  dizziness  fever  pain, redness, or irritation at site where injected  pinpoint red spots on the skin  red or dark-brown urine  shortness of breath or breathing problems  stomach or side pain, or pain at the shoulder  swelling  tiredness  trouble  passing urine or change in the amount of urine  unusual bruising or bleeding Side effects that usually do not require medical attention (report to your doctor or health care professional if they continue or are bothersome):  bone pain  muscle pain This list may not describe all possible side effects. Call your doctor for medical advice about side effects. You may report side effects to FDA at 1-800-FDA-1088. Where should I keep my medicine? Keep out of the reach of children. If you are using this medicine at home, you will be instructed on how to store it. Throw away any unused medicine after the expiration date on the label. NOTE: This sheet is a summary. It may not cover all possible information. If you have questions about this medicine, talk to your doctor, pharmacist, or health care provider.  2021 Elsevier/Gold Standard (2019-07-09 13:20:51)  

## 2020-12-11 ENCOUNTER — Encounter: Payer: Self-pay | Admitting: *Deleted

## 2020-12-11 DIAGNOSIS — T8140XA Infection following a procedure, unspecified, initial encounter: Secondary | ICD-10-CM | POA: Diagnosis not present

## 2020-12-11 DIAGNOSIS — T8149XA Infection following a procedure, other surgical site, initial encounter: Secondary | ICD-10-CM | POA: Diagnosis not present

## 2020-12-11 DIAGNOSIS — N611 Abscess of the breast and nipple: Secondary | ICD-10-CM | POA: Diagnosis not present

## 2020-12-11 DIAGNOSIS — B999 Unspecified infectious disease: Secondary | ICD-10-CM | POA: Diagnosis not present

## 2020-12-11 NOTE — Progress Notes (Signed)
Received notification from Dr. Brantley Stage pt had breast abscess that had to be drained. Per Dr. Lindi Adie hold tx on 6/17 and will resume on 7/8.

## 2020-12-12 ENCOUNTER — Telehealth: Payer: Self-pay | Admitting: Hematology and Oncology

## 2020-12-12 NOTE — Telephone Encounter (Signed)
Scheduled appointment per 06/14 sch msg. Patient will receive updated calender.

## 2020-12-14 MED FILL — Dexamethasone Sodium Phosphate Inj 100 MG/10ML: INTRAMUSCULAR | Qty: 1 | Status: AC

## 2020-12-14 NOTE — Progress Notes (Signed)
Patient Care Team: [name], NP as PCP - General (Nurse Practitioner) Mauro Kaufmann, RN as Oncology Nurse Navigator Rockwell Germany, RN as Oncology Nurse Navigator  DIAGNOSIS:    ICD-10-CM   1. Malignant neoplasm of central portion of right breast in female, estrogen receptor positive ([place])  C50.111    Z17.0       SUMMARY OF ONCOLOGIC HISTORY: Oncology History  Malignant neoplasm of central portion of right breast in female, estrogen receptor positive ([place])  07/04/2020 Initial Diagnosis   Screening mammogram detected right breast mass and right axillary lymph nodes.  Right breast nipple retraction and retroareolar thickening.  2.4 cm mass with 2 abnormal lymph nodes.  Biopsy revealed invasive lobular cancer grade 1-2 ER 95%, PR 95%, Ki-67 30%, HER2 equivocal by IHC negative by FISH ratio 1.39   07/17/2020 Cancer Staging   Staging form: Breast, AJCC 8th Edition - Clinical stage from 07/17/2020: Stage IIA (cT2, cN1, cM0, G2, ER+, PR+, HER2-) - Signed by Nicholas Lose, MD on 07/17/2020    08/09/2020 Surgery   Rght lumpectomy ([facility]): invasive lobular carcinoma, 3.8cm, clear margins, 19/19 right axillary lymph nodes positive for metastatic carcinoma.    08/09/2020 Cancer Staging   Staging form: Breast, AJCC 8th Edition - Pathologic stage from 08/09/2020: Stage IIIA (pT2, pN3a, cM0, G2, ER+, PR+, HER2-) - Signed by Gardenia Phlegm, NP on 08/23/2020  Stage prefix: Initial diagnosis  Histologic grading system: 3 grade system    09/15/2020 -  Chemotherapy    Patient is on Treatment Plan: BREAST TC Q21D         CHIEF COMPLIANT: Canceling chemo with Taxotere and Cytoxan  INTERVAL HISTORY: Shelby [name] is a 50 y.o. with above-mentioned history of breast cancer who is currently on adjuvant chemotherapy with Taxotere and Cytoxan. She had developed an abscess of the Right breast which lead to incision and drainage and removal of purulent material. Her fevers went away and  pain is better. She needs dressing changes daily. Chemo will be held.  ALLERGIES:  is allergic to codeine and peach flavor.  MEDICATIONS:  Current Outpatient Medications  Medication Sig Dispense Refill   ALPRAZolam (XANAX) 0.5 MG tablet Take 1 tablet (0.5 mg total) by mouth at bedtime as needed for anxiety. 40 tablet 0   buPROPion (WELLBUTRIN XL) 150 MG 24 hr tablet Take 1 tablet (150 mg total) by mouth daily. 60 tablet 0   cetirizine (ZYRTEC) 10 MG tablet Take 1 tablet (10 mg total) by mouth daily. 30 tablet 7   citalopram (CELEXA) 20 MG tablet Take 1 tablet (20 mg total) by mouth daily. (Needs to be seen before next refill) (Patient not taking: Reported on 07/31/2020) 30 tablet 11   dexamethasone (DECADRON) 4 MG tablet Take 2 tablets (8 mg total) by mouth daily. Take 1 tablet day before chemo and 1 tablet day after chemo with food 12 tablet 0   fluconazole (DIFLUCAN) 150 MG tablet Take 1 tablet (150 mg total) by mouth every three (3) days as needed. 3 tablet 0   fluticasone (FLONASE) 50 MCG/ACT nasal spray Place 2 sprays into both nostrils daily. 16 g 6   ibuprofen (ADVIL) 800 MG tablet Take 1 tablet (800 mg total) by mouth every 8 (eight) hours as needed. 30 tablet 0   lidocaine-prilocaine (EMLA) cream Apply to affected area once 30 g 3   loratadine (CLARITIN) 10 MG tablet Take 1 tablet (10 mg total) by mouth daily. 30 tablet 11  metFORMIN (GLUCOPHAGE) 500 MG tablet Take 1 tablet (500 mg total) by mouth 2 (two) times daily with a meal. 90 tablet 0   metoprolol tartrate (LOPRESSOR) 50 MG tablet Take 1 tablet (50 mg total) by mouth 2 (two) times daily. 60 tablet 6   mupirocin cream (BACTROBAN) 2 % Apply 1 application topically 2 (two) times daily. 15 g 2   nystatin (MYCOSTATIN/NYSTOP) powder Apply 1 application topically 2 (two) times daily. 15 g 4   ondansetron (ZOFRAN) 8 MG tablet Take 1 tablet (8 mg total) by mouth 2 (two) times daily as needed for refractory nausea / vomiting. Start on day  3 after chemo. 30 tablet 1   pantoprazole (PROTONIX) 40 MG tablet Take 1 tablet (40 mg total) by mouth daily. 30 tablet 2   prochlorperazine (COMPAZINE) 10 MG tablet Take 1 tablet (10 mg total) by mouth every 6 (six) hours as needed (Nausea or vomiting). 30 tablet 1   tolterodine (DETROL LA) 2 MG 24 hr capsule Take 1 capsule (2 mg total) by mouth daily. 60 capsule 1   traMADol (ULTRAM) 50 MG tablet Take 1 tablet (50 mg total) by mouth every 6 (six) hours as needed. 20 tablet 0   No current facility-administered medications for this visit.    PHYSICAL EXAMINATION: ECOG PERFORMANCE STATUS: 1 - Symptomatic but completely ambulatory  Vitals:   12/15/20 1050  BP: 126/82  Pulse: 79  Resp: 18  Temp: 97.6 F (36.4 C)  SpO2: 100%   Filed Weights   12/15/20 1050  Weight: (!) 394 lb 1.6 oz (178.8 kg)     LABORATORY DATA:  I have reviewed the data as listed CMP Latest Ref Rng & Units 12/15/2020 11/24/2020 11/03/2020  Glucose 70 - 99 mg/dL 106(H) 112(H) 119(H)  BUN 6 - 20 mg/dL 14 14 12   Creatinine 0.44 - 1.00 mg/dL 0.76 0.74 0.71  Sodium 135 - 145 mmol/L 138 142 141  Potassium 3.5 - 5.1 mmol/L 4.0 3.9 4.1  Chloride 98 - 111 mmol/L 105 107 108  CO2 22 - 32 mmol/L 25 23 24   Calcium 8.9 - 10.3 mg/dL 8.8(L) 9.3 9.1  Total Protein 6.5 - 8.1 g/dL 6.8 7.1 7.2  Total Bilirubin 0.3 - 1.2 mg/dL 0.3 0.5 0.4  Alkaline Phos 38 - 126 U/L 56 83 82  AST 15 - 41 U/L 17 13(L) 13(L)  ALT 0 - 44 U/L 17 17 17     Lab Results  Component Value Date   WBC 8.0 12/15/2020   HGB 10.5 (L) 12/15/2020   HCT 32.3 (L) 12/15/2020   MCV 93.9 12/15/2020   PLT 374 12/15/2020   NEUTROABS 5.3 12/15/2020    ASSESSMENT & PLAN:  Malignant neoplasm of central portion of right breast in female, estrogen receptor positive ([place]) 07/04/2020:Screening mammogram detected right breast mass and right axillary lymph nodes.  Right breast nipple retraction and retroareolar thickening.  2.4 cm mass with 2 abnormal lymph nodes.   Biopsy revealed invasive lobular cancer grade 1-2 ER 95%, PR 95%, Ki-67 30%, HER2 equivocal by IHC negative by FISH ratio 1.39 T2N1 stage IIA   Right lumpectomy: 08/09/2020: ILC 3.8 cm, 19/19 LN Positive, Margins Neg   Plan: 1. Staging scans 2. Adjuvant chemo with TC X 6 (because of her propensity for diabetes she may only end up getting 4) 3. XRT 4. Adj Anti estrogen therapy (with Verzenio) ------------------------------------------------------------------------------------------------------------------------------------ 08/30/2020: CT CAP: No evidence of metastatic disease 08/30/2020: Bone scan: No evidence of bone metastases -------------------------------------------------------------------------------------------------------------------- Current treatment:  Canceling further chemo Right breast Abscess: S/P I and D We had a lengthy discussion about pros and cons of continuing chemo. Decided to stop it here. Once she heals from the infection, she can start XRT I will see her after XRT  No orders of the defined types were placed in this encounter.  The patient has a good understanding of the overall plan. she agrees with it. she will call with any problems that may develop before the next visit here.  Total time spent: 30 mins including face to face time and time spent for planning, charting and coordination of care  Rulon Eisenmenger, MD, MPH 12/15/2020  I, Thana Ates, am acting as scribe for Dr. Nicholas Lose.  I have reviewed the above documentation for accuracy and completeness, and I agree with the above.

## 2020-12-14 NOTE — Assessment & Plan Note (Signed)
07/04/2020:Screening mammogram detected right breast mass and right axillary lymph nodes. Right breast nipple retraction and retroareolar thickening. 2.4 cm mass with 2 abnormal lymph nodes. Biopsy revealed invasive lobular cancer grade 1-2 ER 95%, PR 95%, Ki-67 30%, HER2 equivocal by IHC negative by FISH ratio 1.39 T2N1 stageIIA  Right lumpectomy: 08/09/2020:ILC 3.8 cm, 19/19 LN Positive, Margins Neg  Plan: 1. Staging scans 2. Adjuvant chemo with TC X 6(because of her propensity for diabetes she may only end up getting 4) 3. XRT 4. Adj Anti estrogen therapy(with Verzenio) ------------------------------------------------------------------------------------------------------------------------------------ 08/30/2020: CT CAP:No evidence of metastatic disease 08/30/2020: Bone scan:No evidence of bone metastases -------------------------------------------------------------------------------------------------------------------- Current treatment: Cycle5Taxotere and Cytoxan Chemo Toxicities: 1.Severe bone pain due to Neulasta: No further issues with bone pain Denies any nausea vomiting or diarrhea or constipation. 2. fatigue: Manageable  Return to clinic in3weeks for cycle6  She is agreeable to finish 6 cycles

## 2020-12-15 ENCOUNTER — Inpatient Hospital Stay: Payer: BC Managed Care – PPO | Attending: Hematology and Oncology

## 2020-12-15 ENCOUNTER — Encounter: Payer: Self-pay | Admitting: *Deleted

## 2020-12-15 ENCOUNTER — Ambulatory Visit: Payer: BC Managed Care – PPO

## 2020-12-15 ENCOUNTER — Inpatient Hospital Stay (HOSPITAL_BASED_OUTPATIENT_CLINIC_OR_DEPARTMENT_OTHER): Payer: BC Managed Care – PPO | Admitting: Hematology and Oncology

## 2020-12-15 ENCOUNTER — Other Ambulatory Visit: Payer: Self-pay

## 2020-12-15 DIAGNOSIS — Z79899 Other long term (current) drug therapy: Secondary | ICD-10-CM | POA: Diagnosis not present

## 2020-12-15 DIAGNOSIS — C50111 Malignant neoplasm of central portion of right female breast: Secondary | ICD-10-CM

## 2020-12-15 DIAGNOSIS — Z170 Estrogen receptor positive status [ER+]: Secondary | ICD-10-CM | POA: Insufficient documentation

## 2020-12-15 DIAGNOSIS — Z95828 Presence of other vascular implants and grafts: Secondary | ICD-10-CM

## 2020-12-15 DIAGNOSIS — C773 Secondary and unspecified malignant neoplasm of axilla and upper limb lymph nodes: Secondary | ICD-10-CM | POA: Diagnosis not present

## 2020-12-15 DIAGNOSIS — Z9221 Personal history of antineoplastic chemotherapy: Secondary | ICD-10-CM | POA: Insufficient documentation

## 2020-12-15 LAB — CMP (CANCER CENTER ONLY)
ALT: 17 U/L (ref 0–44)
AST: 17 U/L (ref 15–41)
Albumin: 3.3 g/dL — ABNORMAL LOW (ref 3.5–5.0)
Alkaline Phosphatase: 56 U/L (ref 38–126)
Anion gap: 8 (ref 5–15)
BUN: 14 mg/dL (ref 6–20)
CO2: 25 mmol/L (ref 22–32)
Calcium: 8.8 mg/dL — ABNORMAL LOW (ref 8.9–10.3)
Chloride: 105 mmol/L (ref 98–111)
Creatinine: 0.76 mg/dL (ref 0.44–1.00)
GFR, Estimated: >60 mL/min (ref >60)
Glucose, Bld: 106 mg/dL — ABNORMAL HIGH (ref 70–99)
Potassium: 4 mmol/L (ref 3.5–5.1)
Sodium: 138 mmol/L (ref 135–145)
Total Bilirubin: 0.3 mg/dL (ref 0.3–1.2)
Total Protein: 6.8 g/dL (ref 6.5–8.1)

## 2020-12-15 LAB — CBC WITH DIFFERENTIAL (CANCER CENTER ONLY)
Abs Immature Granulocytes: 0.04 10*3/uL (ref 0.00–0.07)
Basophils Absolute: 0.1 10*3/uL (ref 0.0–0.1)
Basophils Relative: 1 %
Eosinophils Absolute: 0.1 10*3/uL (ref 0.0–0.5)
Eosinophils Relative: 1 %
HCT: 32.3 % — ABNORMAL LOW (ref 36.0–46.0)
Hemoglobin: 10.5 g/dL — ABNORMAL LOW (ref 12.0–15.0)
Immature Granulocytes: 1 %
Lymphocytes Relative: 26 %
Lymphs Abs: 2.1 10*3/uL (ref 0.7–4.0)
MCH: 30.5 pg (ref 26.0–34.0)
MCHC: 32.5 g/dL (ref 30.0–36.0)
MCV: 93.9 fL (ref 80.0–100.0)
Monocytes Absolute: 0.5 10*3/uL (ref 0.1–1.0)
Monocytes Relative: 6 %
Neutro Abs: 5.3 10*3/uL (ref 1.7–7.7)
Neutrophils Relative %: 65 %
Platelet Count: 374 10*3/uL (ref 150–400)
RBC: 3.44 MIL/uL — ABNORMAL LOW (ref 3.87–5.11)
RDW: 17 % — ABNORMAL HIGH (ref 11.5–15.5)
WBC Count: 8 10*3/uL (ref 4.0–10.5)
nRBC: 0 % (ref 0.0–0.2)

## 2020-12-15 MED ORDER — SODIUM CHLORIDE 0.9% FLUSH
10.0000 mL | INTRAVENOUS | Status: AC | PRN
  Administered 2020-12-15: 10 mL
  Filled 2020-12-15: qty 10

## 2020-12-15 MED ORDER — HEPARIN SOD (PORK) LOCK FLUSH 100 UNIT/ML IV SOLN
500.0000 [IU] | INTRAVENOUS | Status: AC | PRN
  Administered 2020-12-15: 500 [IU]
  Filled 2020-12-15: qty 5

## 2020-12-18 ENCOUNTER — Ambulatory Visit: Payer: BC Managed Care – PPO

## 2020-12-19 ENCOUNTER — Encounter: Payer: Self-pay | Admitting: *Deleted

## 2020-12-26 ENCOUNTER — Other Ambulatory Visit: Payer: BC Managed Care – PPO

## 2020-12-26 ENCOUNTER — Ambulatory Visit: Payer: BC Managed Care – PPO

## 2020-12-26 ENCOUNTER — Ambulatory Visit: Payer: BC Managed Care – PPO | Admitting: Hematology and Oncology

## 2021-01-05 ENCOUNTER — Other Ambulatory Visit: Payer: Self-pay | Admitting: *Deleted

## 2021-01-05 ENCOUNTER — Other Ambulatory Visit: Payer: BC Managed Care – PPO

## 2021-01-05 ENCOUNTER — Ambulatory Visit: Payer: BC Managed Care – PPO

## 2021-01-05 ENCOUNTER — Ambulatory Visit: Payer: BC Managed Care – PPO | Admitting: Hematology and Oncology

## 2021-01-05 DIAGNOSIS — R7303 Prediabetes: Secondary | ICD-10-CM

## 2021-01-05 MED ORDER — METFORMIN HCL 500 MG PO TABS: 500.0000 mg | ORAL_TABLET | Freq: Two times a day (BID) | ORAL | 0 refills | Status: DC

## 2021-01-08 ENCOUNTER — Ambulatory Visit: Payer: BC Managed Care – PPO

## 2021-01-08 ENCOUNTER — Ambulatory Visit: Payer: Self-pay | Admitting: Surgery

## 2021-01-08 ENCOUNTER — Ambulatory Visit (INDEPENDENT_AMBULATORY_CARE_PROVIDER_SITE_OTHER): Payer: BC Managed Care – PPO | Admitting: Family Medicine

## 2021-01-08 DIAGNOSIS — N3000 Acute cystitis without hematuria: Secondary | ICD-10-CM

## 2021-01-08 MED ORDER — FLUCONAZOLE 150 MG PO TABS: 150.0000 mg | ORAL_TABLET | Freq: Once | ORAL | 0 refills | Status: AC

## 2021-01-08 MED ORDER — CEPHALEXIN 500 MG PO CAPS: 500.0000 mg | ORAL_CAPSULE | Freq: Two times a day (BID) | ORAL | 0 refills | Status: DC

## 2021-01-08 NOTE — Progress Notes (Signed)
   Virtual Visit  Note Due to COVID-19 pandemic this visit was conducted virtually. This visit type was conducted due to national recommendations for restrictions regarding the COVID-19 Pandemic (e.g. social distancing, sheltering in place) in an effort to limit this patient's exposure and mitigate transmission in our community. All issues noted in this document were discussed and addressed.  A physical exam was not performed with this format.  I connected with Shelby [name] on 01/08/21 at 1533 by telephone and verified that I am speaking with the correct person using two identifiers. Shelby [name] is currently located in her car and no one is currently with her during the visit. The provider, Gwenlyn Perking, FNP is located in their office at time of visit.  I discussed the limitations, risks, security and privacy concerns of performing an evaluation and management service by telephone and the availability of in person appointments. I also discussed with the patient that there may be a patient responsible charge related to this service. The patient expressed understanding and agreed to proceed.  CC: dysuria  History and Present Illness:  HPI Shelby [name] reports dysuria and urgency x2 days. She denies fever, chills, flank pain, nausea, vomiting, or abdominal pain. Denies vaginal symptoms. She has been at the cancer center completing treatments all day so she has not be able to leave a urine sample for testing. She has had a UTI in the past and reports this feels the same for her.     ROS As per HPI.   Observations/Objective: Alert and oriented x 3. Able to speak in full sentences without difficulty.    Assessment and Plan: Shelby [name] was seen today for dysuria.  Diagnoses and all orders for this visit:  Acute cystitis without hematuria Keflex as below. Diflucan if needed following antibiotic as patient sometimes has yeast infections follow abx.  -     cephALEXin (KEFLEX) 500 MG capsule; Take 1  capsule (500 mg total) by mouth 2 (two) times daily. -     fluconazole (DIFLUCAN) 150 MG tablet; Take 1 tablet (150 mg total) by mouth once for 1 dose.    Follow Up Instructions: Return to office for new or worsening symptoms, or if symptoms persist.     I discussed the assessment and treatment plan with the patient. The patient was provided an opportunity to ask questions and all were answered. The patient agreed with the plan and demonstrated an understanding of the instructions.   The patient was advised to call back or seek an in-person evaluation if the symptoms worsen or if the condition fails to improve as anticipated.  The above assessment and management plan was discussed with the patient. The patient verbalized understanding of and has agreed to the management plan. Patient is aware to call the clinic if symptoms persist or worsen. Patient is aware when to return to the clinic for a follow-up visit. Patient educated on when it is appropriate to go to the emergency department.   Time call ended:  1545  I provided 12 minutes of  non face-to-face time during this encounter.    Gwenlyn Perking, FNP

## 2021-01-08 NOTE — H&P (Signed)
PROVIDER:  Kennieth Francois, MD   MRN: [id number] DOB: May 14, 1971 DATE OF ENCOUNTER: 01/08/2021 Subjective    Chief Complaint: Surgery Follow Up       History of Present Illness: Shelby [name] is a 50 y.o. female who is seen today for patient returns for follow-up of her right breast abscess.  She is 2 months out from right breast lumpectomy with sentinel lymph node mapping for stage II right breast cancer.  She is done with chemotherapy and needs to have her port removed.  Her infection in her right breast has resolved..       Review of Systems: A complete review of systems was obtained from the patient.  I have reviewed this information and discussed as appropriate with the patient.  See HPI as well for other ROS.   Review of Systems  Constitutional: Negative.   HENT: Negative.   Eyes: Negative.   Respiratory: Negative.   Cardiovascular: Negative.   Skin: Negative.         Medical History: Past Medical History      Past Medical History:  Diagnosis Date   History of cancer          There is no problem list on file for this patient.     Past Surgical History       Past Surgical History:  Procedure Laterality Date   breast surgery  N/A     gallbladder surgery N/A     hernia surgery  N/A          Allergies       Allergies  Allergen Reactions   Codeine Palpitations and Swelling      HBP                Current Outpatient Medications on File Prior to Visit  Medication Sig Dispense Refill   citalopram (CELEXA) 20 MG tablet Take by mouth       dexAMETHasone (DECADRON) 4 MG tablet Take by mouth       fluticasone propionate (FLONASE) 50 mcg/actuation nasal spray Place into one nostril       prochlorperazine (COMPAZINE) 10 MG tablet Take by mouth       tolterodine (DETROL LA) 2 MG LA capsule Take by mouth       cetirizine (ZYRTEC) 10 MG tablet Take 10 mg by mouth once daily       doxycycline (VIBRA-TABS) 100 MG tablet Take 1 (one) Tablet by mouth two times  daily       ibuprofen (MOTRIN) 800 MG tablet Take 1 tablet (800 mg total) by mouth every 8 (eight) hours as needed.       lidocaine-prilocaine (EMLA) cream APPLY TO AFFECTED AREA TOPICALLY ONCE       loratadine (CLARITIN) 10 mg tablet Take 10 mg by mouth once daily       metFORMIN (GLUCOPHAGE) 500 MG tablet TAKE ONE TABLET BY MOUTH TWO TIMES DAILY WITH A MEAL       mupirocin (BACTROBAN) 2 % ointment Apply 1 Application topically 2 (two) times daily       NYSTOP 100,000 unit/gram powder Apply 1 Application topically 2 (two) times daily       ondansetron (ZOFRAN) 8 MG tablet Take 1 tablet (8 mg total) by mouth 2 (two) times daily as needed for refractory nausea / vomiting. Start on day 3 after chemo.       pantoprazole (PROTONIX) 40 MG DR tablet Take 40 mg by mouth once daily  traMADoL (ULTRAM) 50 mg tablet TAKE ONE TABLET BY MOUTH EVERY SIX HOURS AS NEEDED        No current facility-administered medications on file prior to visit.      Family History  History reviewed. No pertinent family history.      Social History       Tobacco Use  Smoking Status Never Smoker  Smokeless Tobacco Never Used      Social History  Social History        Socioeconomic History   Marital status: Married  Tobacco Use   Smoking status: Never Smoker   Smokeless tobacco: Never Used  Scientific laboratory technician Use: Never used  Substance and Sexual Activity   Alcohol use: Never   Drug use: Never        Objective:      There were no vitals filed for this visit.  There is no height or weight on file to calculate BMI.   Physical Exam    General appearance - alert, well appearing, and in no distress Neck - supple, no significant adenopathy Breasts - breasts appear normal, no suspicious masses, no skin or nipple changes or axillary nodes, Right breast incision closed.  No further signs of infection redness or drainage down the right.  Right axillar incision clean dry intact port noted right  subclavian region Extremities - pedal edema 1 +     Labs, Imaging and Diagnostic Testing:   none     Assessment and Plan:      Postoperative state-she is doing chemo and report will be removed.  Discussed risk of bleeding, infection, catheter migration, catheter fragmentation, cardiovascular risk, pulmonary risk, and the risk of her elevated BMI   She agrees to proceed   Return in about 3 months (around 04/10/2021).   Kennieth Francois, MD    I had direct face-to-face contact with the patient for a total of 20 minutes and greater than 50% of that time was spent providing counseling and/or coordination of care for the patient regardingport removal/ wound care.

## 2021-01-11 NOTE — Progress Notes (Signed)
Location of Breast Cancer:  Malignant neoplasm of central portion of RIGHT breast, estrogen receptor positive  Histology per Pathology Report:  08/09/2020 FINAL MICROSCOPIC DIAGNOSIS:  A. BREAST, RIGHT, LUMPECTOMY:  - Invasive lobular carcinoma, pleomorphic type, 3.8 cm (pT2).  - Margins not involved.  - Invasive carcinoma 0.1 cm from inferior and posterior margins.  - Biopsy sites and biopsy clips.  - See oncology table.  B. LYMPH NODES, RIGHT AXILLARY CONTENTS:  - Metastatic carcinoma in nineteen of nineteen lymph nodes  (19/19)(pN3a).  - Extracapsular extension by tumor.  C. BREAST, RIGHT ANTERIOR MARGIN, EXCISION:  - Benign breast tissue.  - No malignancy identified.  - Final anterior margin free of tumor.  D. BREAST, RIGHT MEDIAL MARGIN, EXCISION:  - Benign breast tissue with ductal papilloma and focal usual ductal hyperplasia.  - No malignancy identified.  - Final medial margin free of tumor.   Receptor Status: ER(95%), PR (95%), Her2-neu (Negative via FISH), Ki-67(30%)  Did patient present with symptoms (if so, please note symptoms) or was this found on screening mammography?:  07/04/2020: Screening mammogram detected right breast mass and right axillary lymph nodes.  Right breast nipple retraction and retroareolar thickening.  2.4 cm mass with 2 abnormal lymph nodes  Past/Anticipated interventions by surgeon, if any:  08/09/2020 --Dr. Marcello Moores [facility] Right breast seed localized lumpectomy with targeted right axillary lymph node biopsy followed by subsequent axillary lymph node dissection due to matted gross nodal disease  Past/Anticipated interventions by medical oncology, if any:  Under care of Dr. Nicholas Lose 12/15/2020 --Plan: 1. Staging scans 2. Adjuvant chemo with TC X 6 (because of her propensity for diabetes she may only end up getting 4) 3. XRT 4. Adj Anti estrogen therapy (with Verzenio) --Current treatment: Canceling further chemo Right breast Abscess:  S/P I and D We had a lengthy discussion about pros and cons of continuing chemo. Decided to stop it here. Once she heals from the infection, she can start XRT I will see her after XRT   Lymphedema issues, if any:  Patient denies    Pain issues, if any:  Patient denies   SAFETY ISSUES: Prior radiation? No Pacemaker/ICD? No Possible current pregnancy? No--no menstrual cycle for ~1 year Is the patient on methotrexate? No  Current Complaints / other details:  Patient has received the first 3 Pfizer vaccines

## 2021-01-14 NOTE — Progress Notes (Signed)
Radiation Oncology         [phone] ________________________________  Name: Shelby [name] MRN: [id number]  Date: 01/15/2021  DOB: 03/05/1971  Follow-Up New - Outpatient Visit  CC: [name], NP  Nicholas Lose, MD  Diagnosis:      ICD-10-CM   1. Malignant neoplasm of central portion of right breast in female, estrogen receptor positive ([place])  C50.111 Ambulatory referral to Physical Therapy   Z17.0       Cancer Staging Malignant neoplasm of central portion of right breast in female, estrogen receptor positive ([place]) Staging form: Breast, AJCC 8th Edition - Clinical stage from 07/17/2020: Stage IIA (cT2, cN1, cM0, G2, ER+, PR+, HER2-) - Signed by Nicholas Lose, MD on 07/17/2020 Stage prefix: Initial diagnosis - Pathologic stage from 08/09/2020: Stage IIIA (pT2, pN3a, cM0, G2, ER+, PR+, HER2-) - Signed by Gardenia Phlegm, NP on 08/23/2020 Stage prefix: Initial diagnosis Histologic grading system: 3 grade system  Malignant neoplasm of central portion of right breast in female, estrogen receptor positive (HCC)  ( Invasive Lobular Carcinoma, ER+ / PR+ / Her2-, Grade 2 )  CHIEF COMPLAINT: Here to discuss management of right breast cancer  Narrative:  The patient returns today for follow-up with her significant other.     Since consultation date, she underwent the following imaging (dates and results as follows):   - Chest CT taken on 08/30/20 which showed postsurgical changes in the right breast and right axilla. Also seen were prominent right axillary lymph nodes measuring up to 9 mm, which were noted to be nonspecific but somewhat suspicious for disease involvement given their size, location and extent of lymph node involvement at surgery. Otherwise, findings showed no suspicious pulmonary nodules or evidence of metastatic disease in the abdomen or pelvis.  -Whole body bone scan taken on 08/30/20 revealed no areas on abnormal radiotracer localization to suggest metastatic  disease to the bone. Of note were however degenerative uptake to the shoulders, mid to lower thoracic spine, and both knees.  Breast or nodal biopsies, since consultation, included a right breast lumpectomy received on 08/09/20 which revealed: invasive lobular carcinoma, pleomorphic type, 3.8 cm, with margins not involved; the carcinoma measured 0.1 cm from the inferior and posterior margins. Also seen was metastatic carcinoma in nineteen of nineteen right axillary lymph nodes. Excision of the right anterior margin revealed benign breast tissue, and excision of the right medial margin revealed  benign breast tissue with ductal papilloma and focal usual ductal  hyperplasia. Prognostic indicators were significant for ER 95% positive with moderate staining intensity, and PR >95% positive with strong staining intensity, Her2 negative, Ki67: 30%. Pathologic staging classification: (pTNM, AJCC 8th Edition): pT2, pN3a   The patient received chemotherapy (Taxotere and Cytoxan) from 09/15/20-12/15/20 ( Dr. Lindi Adie canceled further chemo on 06/17).  Patient recently had an infection drained from her lumpectomy cavity, abscess, healing well without evidence of persistent infection.  She  states received 20 days of oral antibiotics.  Symptomatically, the patient reports (during her visit on 01/08/21 with Gwenlyn Perking NP): some dysuria and urgency for 2 days. She denied fever, chills, flank pain, nausea, vomiting, abdominal pain, and vaginal symptoms. During said visit the patient was found to have Acute cystitis without hematuria; she accordingly was given keflex and diflucan.         ALLERGIES:  is allergic to codeine and peach flavor.  Meds: Current Outpatient Medications  Medication Sig Dispense Refill   ALPRAZolam (XANAX) 0.5 MG tablet Take  1 tablet (0.5 mg total) by mouth at bedtime as needed for anxiety. 40 tablet 0   buPROPion (WELLBUTRIN XL) 150 MG 24 hr tablet Take 1 tablet (150 mg total) by mouth  daily. 60 tablet 0   cephALEXin (KEFLEX) 500 MG capsule Take 1 capsule (500 mg total) by mouth 2 (two) times daily. 14 capsule 0   cetirizine (ZYRTEC) 10 MG tablet Take 1 tablet (10 mg total) by mouth daily. 30 tablet 7   citalopram (CELEXA) 20 MG tablet Take 1 tablet (20 mg total) by mouth daily. (Needs to be seen before next refill) (Patient not taking: Reported on 07/31/2020) 30 tablet 11   dexamethasone (DECADRON) 4 MG tablet Take 2 tablets (8 mg total) by mouth daily. Take 1 tablet day before chemo and 1 tablet day after chemo with food 12 tablet 0   fluticasone (FLONASE) 50 MCG/ACT nasal spray Place 2 sprays into both nostrils daily. 16 g 6   ibuprofen (ADVIL) 800 MG tablet Take 1 tablet (800 mg total) by mouth every 8 (eight) hours as needed. 30 tablet 0   lidocaine-prilocaine (EMLA) cream Apply to affected area once 30 g 3   loratadine (CLARITIN) 10 MG tablet Take 1 tablet (10 mg total) by mouth daily. 30 tablet 11   metFORMIN (GLUCOPHAGE) 500 MG tablet Take 1 tablet (500 mg total) by mouth 2 (two) times daily with a meal. 90 tablet 0   metoprolol tartrate (LOPRESSOR) 50 MG tablet Take 1 tablet (50 mg total) by mouth 2 (two) times daily. 60 tablet 6   mupirocin cream (BACTROBAN) 2 % Apply 1 application topically 2 (two) times daily. 15 g 2   nystatin (MYCOSTATIN/NYSTOP) powder Apply 1 application topically 2 (two) times daily. 15 g 4   ondansetron (ZOFRAN) 8 MG tablet Take 1 tablet (8 mg total) by mouth 2 (two) times daily as needed for refractory nausea / vomiting. Start on day 3 after chemo. 30 tablet 1   pantoprazole (PROTONIX) 40 MG tablet Take 1 tablet (40 mg total) by mouth daily. 30 tablet 2   prochlorperazine (COMPAZINE) 10 MG tablet Take 1 tablet (10 mg total) by mouth every 6 (six) hours as needed (Nausea or vomiting). 30 tablet 1   tolterodine (DETROL LA) 2 MG 24 hr capsule Take 1 capsule (2 mg total) by mouth daily. 60 capsule 1   traMADol (ULTRAM) 50 MG tablet Take 1 tablet (50  mg total) by mouth every 6 (six) hours as needed. 20 tablet 0   No current facility-administered medications for this encounter.    Physical Findings:  height is 5' 6"  (1.676 m) and weight is 403 lb 6 oz (183 kg) (abnormal). Her temporal temperature is 96.8 F (36 C) (abnormal). Her blood pressure is 150/97 (abnormal) and her pulse is 91. Her respiration is 22 (abnormal) and oxygen saturation is 100%. .     General: Alert and oriented, in no acute distress HEENT: Alopecia of scalp.    Extremities: No evidence of upper extremity asymmetry or focal edema Psychiatric: Judgment and insight are intact. Affect is appropriate. Breast exam reveals satisfactory healing of lumpectomy scar, right breast.  The skin is retracted in this area.  Axillary scar is unremarkable on right.  No cellulitis or evidence of infection persisting in right breast  Lab Findings: Lab Results  Component Value Date   WBC 8.0 12/15/2020   HGB 10.5 (L) 12/15/2020   HCT 32.3 (L) 12/15/2020   MCV 93.9 12/15/2020   PLT 374  12/15/2020     Radiographic Findings: As above  Impression/Plan: Locally advanced right breast cancer  We discussed adjuvant radiotherapy today.  I recommend radiation therapy to the right breast and regional nodes in order to reduce risk of local regional recurrence by two thirds.  If reasonably possible based on her anatomy, I will try to treat the internal mammary nodes as well as the supraclavicular and axillary nodes.  I reviewed the logistics, benefits, risks, and potential side effects of this treatment in detail. Risks may include but not necessary be limited to acute and late injury tissue in the radiation fields such as skin irritation (change in color/pigmentation, itching, dryness, pain, peeling). She may experience fatigue. We also discussed possible risk of long term cosmetic changes or scar tissue. There is also a smaller risk for lung toxicity, brachial plexopathy, lymphedema,  musculoskeletal changes, rib fragility or induction of a second malignancy, late chronic non-healing soft tissue wound.    The patient asked good questions which I answered to her satisfaction. She is enthusiastic about proceeding with treatment. A consent form has been signed and placed in her chart.   Pregnancy test ordered today.  She understands importance of avoiding pregnancy during radiation.  Physical therapy referral made today to prevent lymphedema moving forward after significant axillary surgery.  The patient is due for her fourth COVID shot.  She and her husband are both interested in receiving their shots to get up-to-date on vaccinations against COVID.  Nursing will arrange.  Treatment planning will take place today and treatment will start next week.  She anticipates vacation in mid September and will be done with radiation by then.  On date of service, in total, I spent 30 minutes on this encounter. Patient was seen in person.  _____________________________________   Eppie Gibson, MD  This document serves as a record of services personally performed by Eppie Gibson, MD. It was created on her behalf by Roney Mans, a trained medical scribe. The creation of this record is based on the scribe's personal observations and the provider's statements to them. This document has been checked and approved by the attending provider.

## 2021-01-15 ENCOUNTER — Other Ambulatory Visit: Payer: Self-pay

## 2021-01-15 ENCOUNTER — Ambulatory Visit
Admission: RE | Admit: 2021-01-15 | Discharge: 2021-01-15 | Disposition: A | Discharge status: Home / Self Care | Payer: BC Managed Care – PPO | Source: Ambulatory Visit | Attending: Radiation Oncology | Admitting: Radiation Oncology

## 2021-01-15 ENCOUNTER — Encounter: Payer: Self-pay | Admitting: Radiation Oncology

## 2021-01-15 VITALS — BP 150/97 | HR 91 | Temp 96.8°F | Resp 22 | Ht 66.0 in | Wt >= 6400 oz

## 2021-01-15 DIAGNOSIS — Z7984 Long term (current) use of oral hypoglycemic drugs: Secondary | ICD-10-CM | POA: Diagnosis not present

## 2021-01-15 DIAGNOSIS — Z170 Estrogen receptor positive status [ER+]: Secondary | ICD-10-CM | POA: Insufficient documentation

## 2021-01-15 DIAGNOSIS — C50111 Malignant neoplasm of central portion of right female breast: Secondary | ICD-10-CM | POA: Insufficient documentation

## 2021-01-15 DIAGNOSIS — Z7952 Long term (current) use of systemic steroids: Secondary | ICD-10-CM | POA: Insufficient documentation

## 2021-01-15 DIAGNOSIS — Z510 Encounter for antineoplastic radiation therapy: Secondary | ICD-10-CM | POA: Diagnosis not present

## 2021-01-15 DIAGNOSIS — Z79899 Other long term (current) drug therapy: Secondary | ICD-10-CM | POA: Diagnosis not present

## 2021-01-15 LAB — PREGNANCY, URINE: Preg Test, Ur: NEGATIVE

## 2021-01-16 ENCOUNTER — Telehealth: Payer: Self-pay | Admitting: Radiation Oncology

## 2021-01-16 ENCOUNTER — Telehealth: Payer: Self-pay | Admitting: *Deleted

## 2021-01-16 NOTE — Telephone Encounter (Signed)
Scheduled appts per 7/18 sch msg. Pt aware.  

## 2021-01-16 NOTE — Telephone Encounter (Signed)
PATIENT WILL HAVE PT APPT. ON 01-17-21 @ 8AM @ [facility] [place]

## 2021-01-17 ENCOUNTER — Ambulatory Visit: Payer: BC Managed Care – PPO | Attending: Radiation Oncology | Admitting: Rehabilitation

## 2021-01-17 ENCOUNTER — Other Ambulatory Visit: Payer: Self-pay

## 2021-01-17 ENCOUNTER — Encounter: Payer: Self-pay | Admitting: Rehabilitation

## 2021-01-17 DIAGNOSIS — Z9189 Other specified personal risk factors, not elsewhere classified: Secondary | ICD-10-CM | POA: Insufficient documentation

## 2021-01-17 DIAGNOSIS — Z483 Aftercare following surgery for neoplasm: Secondary | ICD-10-CM | POA: Diagnosis not present

## 2021-01-17 NOTE — Therapy (Signed)
[place], [place], [address] Phone: [phone]   Fax:  [phone]  Physical Therapy Evaluation  Patient Details  Name: Shelby [name] MRN: [id number] Date of Birth: 09/01/1970 Referring Provider (PT): Dr. Isidore Moos   Encounter Date: 01/17/2021   PT End of Session - 01/17/21 0857     Visit Number 1    Number of Visits 1    PT Start Time 0802    PT Stop Time 0856    PT Time Calculation (min) 54 min    Activity Tolerance Patient tolerated treatment well    Behavior During Therapy [facility] for tasks assessed/performed             Past Medical History:  Diagnosis Date   Cancer ([place])    right breast   Complication of anesthesia    hard to wake up  per pt   Diabetes mellitus without complication ([place])    Dyspnea    Heart palpitations    Obesity     Past Surgical History:  Procedure Laterality Date   BREAST LUMPECTOMY WITH RADIOACTIVE SEED AND SENTINEL LYMPH NODE BIOPSY Right 08/09/2020   Procedure: RIGHT BREAST LUMPECTOMY WITH RADIOACTIVE SEED AND RIGHT BREAST RADIOACTIVE SEED TARGETED SENTINEL LYMPH NODE BIOPSY, RIGHT BREAST SENTINEL LYMPH NODE [place];  Surgeon: Erroll Luna, MD;  Location: [facility];  Service: General;  Laterality: Right;  PEC BLOCK   CHOLECYSTECTOMY     HERNIA REPAIR     IR IMAGING GUIDED PORT INSERTION  08/29/2020   IR IMAGING GUIDED PORT INSERTION  08/29/2020   lumpectomy right breast      There were no vitals filed for this visit.    Subjective Assessment - 01/17/21 0801     Subjective I am not sure why I am here    Pertinent History Rt lumpectomy by Dr. Brantley Stage on 08/09/20 with 19/19 nodes positive.  Recently stopped chemotherapy due to side effects.  Had an abcess in the Rt breast and radiation will start as soon as the abcess has cleared.   Other history includes morbid obesity.    Limitations Lifting    Patient Stated Goals not sure    Currently in Pain? No/denies   only  when I stretch it out 3-4/10               [facility] PT Assessment - 01/17/21 0001       Assessment   Medical Diagnosis Rt breast cancer    Referring Provider (PT) Dr. Isidore Moos    Onset Date/Surgical Date 08/09/20    Hand Dominance Right    Next MD Visit will be doing radiation    Prior Therapy no      Precautions   Precaution Comments fall, lymphedema Rt,      Balance Screen   Has the patient fallen in the past 6 months No    Has the patient had a decrease in activity level because of a fear of falling?  No    Is the patient reluctant to leave their home because of a fear of falling?  No      Home Environment   Living Environment Private residence    Living Arrangements Spouse/significant other    Available Help at Discharge Family    Type of [place] Access Level entry    [place] One level    Home Equipment Wheelchair - power    Additional Comments ambulatory but uses power chair  left foot - old break      Prior Function   Level of Independence Independent    Vocation Self employed    Landscape architect    Leisure none      Cognition   Overall Cognitive Status Within Functional Limits for tasks assessed      Observation/Other Assessments   Observations Right breast healing well - 3 incisions 1 from abcess excision.  No edema noted - some tenderness pectoralis muscle      Posture/Postural Control   Posture/Postural Control Postural limitations    Postural Limitations Rounded Shoulders;Forward head      ROM / Strength   AROM / PROM / Strength AROM      AROM   AROM Assessment Site Shoulder    Right/Left Shoulder Right;Left    Right Shoulder Flexion 140 Degrees   pain axilla   Right Shoulder ABduction 153 Degrees    Right Shoulder External Rotation --   behind any the head WNL no pain   Left Shoulder Flexion 150 Degrees    Left Shoulder ABduction 153 Degrees    Left Shoulder External Rotation --   behind the head WNL and no pain               LYMPHEDEMA/ONCOLOGY QUESTIONNAIRE - 01/17/21 0001       Type   Cancer Type right      Surgeries   Lumpectomy Date 08/09/20    Number Lymph Nodes Removed 19      Treatment   Active Chemotherapy Treatment No    Past Chemotherapy Treatment Yes    Active Radiation Treatment No    Past Radiation Treatment No      What other symptoms do you have   Are you Having Heaviness or Tightness No    Are you having Pain No      Lymphedema Assessments   Lymphedema Assessments Upper extremities      Right Upper Extremity Lymphedema   At Axilla  44.5 cm    15 cm Proximal to Olecranon Process 43.5 cm    10 cm Proximal to Olecranon Process 40.3 cm    Olecranon Process 32.5 cm    15 cm Proximal to Ulnar Styloid Process 30.3 cm    10 cm Proximal to Ulnar Styloid Process 28.9 cm    Just Proximal to Ulnar Styloid Process 19.5 cm    Across Hand at [employer] 20.3 cm    At [place] of 2nd Digit 7.3 cm    Other 42-42.5      Left Upper Extremity Lymphedema   15 cm Proximal to Olecranon Process 44.5 cm    10 cm Proximal to Olecranon Process 43 cm    Olecranon Process 33.8 cm    15 cm Proximal to Ulnar Styloid Process 31.2 cm    10 cm Proximal to Ulnar Styloid Process 38.5 cm    Just Proximal to Ulnar Styloid Process 19.6 cm    Across Hand at [employer] 20.4 cm    At [place] of 2nd Digit 7.2 cm                     Objective measurements completed on examination: See above findings.       [facility] Adult PT Treatment/Exercise - 01/17/21 0001       Self-Care   Self-Care Other Self-Care Comments    Other Self-Care Comments  discussed lymphedema risk and risk reduction, measured for sleeve but  then rememebered that [facility] only allows you to order 4 at a time so pt was given information for [facility] to order the same.  Discussed doorway stretch to overhead wall stretch for mild ROM limitations and pectoralis tenderness.                    PT  Education - 01/17/21 0857     Education Details how to get sleeve, radiation side effects and importance of walking and posture    Person(s) Educated Patient;Spouse    Methods Explanation;Handout;Verbal cues    Comprehension Verbalized understanding              PT Short Term Goals - 01/17/21 0901       PT SHORT TERM GOAL #1   Title Pt will be education on lymphedema surveillance and risk reduction    Status Achieved      PT SHORT TERM GOAL #2   Title Pt will be educated on radiation side effects and how to minimize side effects    Status Achieved                       Plan - 01/17/21 0858     Clinical Impression Statement Pt presents to PT doing very well overall post recent Rt breast abcess after lumpectomy.  ROM is approaching equal to the otherside with tenderness and pull in the pectoralis as the only noted symptom.  Due to history and 19 nodes removed focused on lymphedema education, how to get a sleeve, and when to return to therapy if needed.  Also focused on 2 home stretches and how to fight radiation side effects.  Pt knows when to return to PT if needed.    Personal Factors and Comorbidities Age;Comorbidity 3+    Comorbidities ALND, abcess, breast cancer chemotherapy    Examination-Activity Limitations Lift;Carry    Stability/Clinical Decision Making Stable/Uncomplicated    Clinical Decision Making Low    Rehab Potential Excellent    PT Frequency One time visit    PT Treatment/Interventions ADLs/Self Care Home Management;Therapeutic exercise;Patient/family education;Manual lymph drainage;Manual techniques;Passive range of motion    PT Next Visit Plan reassess if freturn    PT Home Exercise Plan doorway stretch and overhead flexion, posture, walking    Consulted and Agree with Plan of Care Patient             Patient will benefit from skilled therapeutic intervention in order to improve the following deficits and impairments:  Decreased skin  integrity  Visit Diagnosis: Aftercare following surgery for neoplasm  At risk for lymphedema     Problem List Patient Active Problem List   Diagnosis Date Noted   Malignant neoplasm of central portion of right breast in female, estrogen receptor positive ([place]) 07/17/2020   Annual physical exam 07/11/2020   Wound check, abscess 07/11/2020   Allergic rhinitis 08/18/2017   Anxiety 08/18/2017   Nonhealing nonsurgical wound 05/13/2016   Gastroesophageal reflux disease without esophagitis 04/22/2016   Eustachian tube dysfunction, bilateral 04/22/2016   Moderate single current episode of major depressive disorder ([place]) 04/22/2016   Essential hypertension 03/19/2016   Hypertriglyceridemia 03/19/2016   Morbid obesity ([place]) 03/19/2016   Palpitations 03/19/2016    Stark Bray 01/17/2021, 9:02 AM  [place], [place], [address] Phone: [phone]   Fax:  [phone]  Name: Shelby [name] MRN: [id number] Date of Birth: 07/15/1970

## 2021-01-18 ENCOUNTER — Encounter: Payer: Self-pay | Admitting: *Deleted

## 2021-01-22 ENCOUNTER — Other Ambulatory Visit: Payer: Self-pay

## 2021-01-22 ENCOUNTER — Ambulatory Visit
Admission: RE | Admit: 2021-01-22 | Payer: BC Managed Care – PPO | Source: Ambulatory Visit | Admitting: Radiation Oncology

## 2021-01-22 ENCOUNTER — Ambulatory Visit: Payer: BC Managed Care – PPO

## 2021-01-23 ENCOUNTER — Inpatient Hospital Stay: Payer: BC Managed Care – PPO | Attending: Hematology and Oncology

## 2021-01-23 ENCOUNTER — Ambulatory Visit
Admission: RE | Admit: 2021-01-23 | Discharge: 2021-01-23 | Disposition: A | Discharge status: Home / Self Care | Payer: BC Managed Care – PPO | Source: Ambulatory Visit | Attending: Radiation Oncology | Admitting: Radiation Oncology

## 2021-01-23 DIAGNOSIS — Z23 Encounter for immunization: Secondary | ICD-10-CM | POA: Diagnosis not present

## 2021-01-23 DIAGNOSIS — Z510 Encounter for antineoplastic radiation therapy: Secondary | ICD-10-CM | POA: Diagnosis not present

## 2021-01-23 DIAGNOSIS — Z170 Estrogen receptor positive status [ER+]: Secondary | ICD-10-CM

## 2021-01-23 DIAGNOSIS — C50111 Malignant neoplasm of central portion of right female breast: Secondary | ICD-10-CM

## 2021-01-23 MED ORDER — ALRA NON-METALLIC DEODORANT (RAD-ONC)
1.0000 "application " | Freq: Once | TOPICAL | Status: AC
  Administered 2021-01-23: 1 via TOPICAL

## 2021-01-23 MED ORDER — RADIAPLEXRX EX GEL: Freq: Once | CUTANEOUS | Status: AC

## 2021-01-23 NOTE — Progress Notes (Signed)

## 2021-01-23 NOTE — Progress Notes (Signed)
   Covid-19 Vaccination Clinic  Name:  Shelby [name]    MRN: [id number] DOB: [birth year]  01/23/2021  Shelby [name] was observed post Covid-19 immunization for 15 minutes without incident. She was provided with Vaccine Information Sheet and instruction to access the V-Safe system.   Shelby [name] was instructed to call 911 with any severe reactions post vaccine: Difficulty breathing  Swelling of face and throat  A fast heartbeat  A bad rash all over body  Dizziness and weakness

## 2021-01-24 ENCOUNTER — Ambulatory Visit: Payer: BC Managed Care – PPO

## 2021-01-25 ENCOUNTER — Ambulatory Visit
Admission: RE | Admit: 2021-01-25 | Discharge: 2021-01-25 | Disposition: A | Discharge status: Home / Self Care | Payer: BC Managed Care – PPO | Source: Ambulatory Visit | Attending: Radiation Oncology | Admitting: Radiation Oncology

## 2021-01-25 ENCOUNTER — Encounter: Payer: Self-pay | Admitting: General Practice

## 2021-01-25 DIAGNOSIS — Z170 Estrogen receptor positive status [ER+]: Secondary | ICD-10-CM | POA: Diagnosis not present

## 2021-01-25 DIAGNOSIS — Z510 Encounter for antineoplastic radiation therapy: Secondary | ICD-10-CM | POA: Diagnosis not present

## 2021-01-25 DIAGNOSIS — C50111 Malignant neoplasm of central portion of right female breast: Secondary | ICD-10-CM | POA: Diagnosis not present

## 2021-01-25 NOTE — Progress Notes (Signed)
[facility] Spiritual Care Note  Referred by Willodean Rosenthal for emotional support resources. Left voicemail of introduction, encouraging return call.   [place], [name], [facility] Pager [phone] Voicemail [phone]

## 2021-01-26 ENCOUNTER — Other Ambulatory Visit: Payer: Self-pay

## 2021-01-26 ENCOUNTER — Ambulatory Visit
Admission: RE | Admit: 2021-01-26 | Discharge: 2021-01-26 | Disposition: A | Discharge status: Home / Self Care | Payer: BC Managed Care – PPO | Source: Ambulatory Visit | Attending: Radiation Oncology | Admitting: Radiation Oncology

## 2021-01-26 ENCOUNTER — Telehealth (INDEPENDENT_AMBULATORY_CARE_PROVIDER_SITE_OTHER): Payer: BC Managed Care – PPO | Admitting: Family Medicine

## 2021-01-26 DIAGNOSIS — Z510 Encounter for antineoplastic radiation therapy: Secondary | ICD-10-CM | POA: Diagnosis not present

## 2021-01-26 DIAGNOSIS — C50111 Malignant neoplasm of central portion of right female breast: Secondary | ICD-10-CM | POA: Diagnosis not present

## 2021-01-26 DIAGNOSIS — R399 Unspecified symptoms and signs involving the genitourinary system: Secondary | ICD-10-CM | POA: Diagnosis not present

## 2021-01-26 DIAGNOSIS — Z170 Estrogen receptor positive status [ER+]: Secondary | ICD-10-CM | POA: Diagnosis not present

## 2021-01-26 MED ORDER — SULFAMETHOXAZOLE-TRIMETHOPRIM 800-160 MG PO TABS
1.0000 | ORAL_TABLET | Freq: Two times a day (BID) | ORAL | 0 refills | Status: AC
Start: 2021-01-26 — End: 2021-02-02

## 2021-01-26 NOTE — Progress Notes (Signed)
   Virtual Visit  Note Due to COVID-19 pandemic this visit was conducted virtually. This visit type was conducted due to national recommendations for restrictions regarding the COVID-19 Pandemic (e.g. social distancing, sheltering in place) in an effort to limit this patient's exposure and mitigate transmission in our community. All issues noted in this document were discussed and addressed.  A physical exam was not performed with this format.  I connected with Shelby [name] on 01/26/21 at 1627 by telephone and verified that I am speaking with the correct person using two identifiers. Shelby [name] is currently located at home and no one is currently with her during the visit. The provider, Gwenlyn Perking, FNP is located in their office at time of visit.  I discussed the limitations, risks, security and privacy concerns of performing an evaluation and management service by telephone and the availability of in person appointments. I also discussed with the patient that there may be a patient responsible charge related to this service. The patient expressed understanding and agreed to proceed.   History and Present Illness:  HPI Shelby [name] report urinary frequency and dysuria x 1 day. Denies fever, chills, nausea, vomiting, flank pain. She has had chemo treatments all day today so she has not been able to leave a specimen for testing. She recently was treated for a UTI about 2 weeks ago. She reports a previous history of UTIs, although not this frequent. She wonders if this is a reaction to some of the dyes in her medications as she has had UTIs from this before.    ROS As per HPI.   Observations/Objective: Alert and oriented x 3. Able to speak in full sentences without difficulty.   Assessment and Plan: Diagnoses and all orders for this visit:  UTI symptoms Unable to leave specimen for testing today. Given symptoms, will treat for UTI. Discussed to follow up in office if symptoms persist, worsen,  or return. Stay well hydrated.  -     sulfamethoxazole-trimethoprim (BACTRIM DS) 800-160 MG tablet; Take 1 tablet by mouth 2 (two) times daily for 7 days.    Follow Up Instructions: Return to office for new or worsening symptoms, or if symptoms persist.     I discussed the assessment and treatment plan with the patient. The patient was provided an opportunity to ask questions and all were answered. The patient agreed with the plan and demonstrated an understanding of the instructions.   The patient was advised to call back or seek an in-person evaluation if the symptoms worsen or if the condition fails to improve as anticipated.  The above assessment and management plan was discussed with the patient. The patient verbalized understanding of and has agreed to the management plan. Patient is aware to call the clinic if symptoms persist or worsen. Patient is aware when to return to the clinic for a follow-up visit. Patient educated on when it is appropriate to go to the emergency department.   Time call ended:  1638  I provided 11 minutes of  non face-to-face time during this encounter.    Gwenlyn Perking, FNP

## 2021-01-29 ENCOUNTER — Ambulatory Visit
Admission: RE | Admit: 2021-01-29 | Discharge: 2021-01-29 | Disposition: A | Discharge status: Home / Self Care | Payer: BC Managed Care – PPO | Source: Ambulatory Visit | Attending: Radiation Oncology | Admitting: Radiation Oncology

## 2021-01-29 ENCOUNTER — Encounter: Payer: Self-pay | Admitting: Family Medicine

## 2021-01-29 DIAGNOSIS — Z510 Encounter for antineoplastic radiation therapy: Secondary | ICD-10-CM | POA: Diagnosis not present

## 2021-01-29 DIAGNOSIS — C50111 Malignant neoplasm of central portion of right female breast: Secondary | ICD-10-CM | POA: Diagnosis not present

## 2021-01-29 DIAGNOSIS — Z170 Estrogen receptor positive status [ER+]: Secondary | ICD-10-CM | POA: Insufficient documentation

## 2021-01-30 ENCOUNTER — Ambulatory Visit
Admission: RE | Admit: 2021-01-30 | Discharge: 2021-01-30 | Disposition: A | Discharge status: Home / Self Care | Payer: BC Managed Care – PPO | Source: Ambulatory Visit | Attending: Radiation Oncology | Admitting: Radiation Oncology

## 2021-01-30 ENCOUNTER — Other Ambulatory Visit: Payer: Self-pay

## 2021-01-30 DIAGNOSIS — Z510 Encounter for antineoplastic radiation therapy: Secondary | ICD-10-CM | POA: Diagnosis not present

## 2021-01-30 DIAGNOSIS — Z170 Estrogen receptor positive status [ER+]: Secondary | ICD-10-CM | POA: Diagnosis not present

## 2021-01-30 DIAGNOSIS — C50111 Malignant neoplasm of central portion of right female breast: Secondary | ICD-10-CM | POA: Diagnosis not present

## 2021-01-31 ENCOUNTER — Ambulatory Visit
Admission: RE | Admit: 2021-01-31 | Discharge: 2021-01-31 | Disposition: A | Discharge status: Home / Self Care | Payer: BC Managed Care – PPO | Source: Ambulatory Visit | Attending: Radiation Oncology | Admitting: Radiation Oncology

## 2021-01-31 DIAGNOSIS — C50111 Malignant neoplasm of central portion of right female breast: Secondary | ICD-10-CM | POA: Diagnosis not present

## 2021-01-31 DIAGNOSIS — Z510 Encounter for antineoplastic radiation therapy: Secondary | ICD-10-CM | POA: Diagnosis not present

## 2021-01-31 DIAGNOSIS — Z170 Estrogen receptor positive status [ER+]: Secondary | ICD-10-CM | POA: Diagnosis not present

## 2021-02-01 ENCOUNTER — Ambulatory Visit
Admission: RE | Admit: 2021-02-01 | Discharge: 2021-02-01 | Disposition: A | Discharge status: Home / Self Care | Payer: BC Managed Care – PPO | Source: Ambulatory Visit | Attending: Radiation Oncology | Admitting: Radiation Oncology

## 2021-02-01 DIAGNOSIS — Z510 Encounter for antineoplastic radiation therapy: Secondary | ICD-10-CM | POA: Diagnosis not present

## 2021-02-01 DIAGNOSIS — C50111 Malignant neoplasm of central portion of right female breast: Secondary | ICD-10-CM | POA: Diagnosis not present

## 2021-02-01 DIAGNOSIS — Z170 Estrogen receptor positive status [ER+]: Secondary | ICD-10-CM | POA: Diagnosis not present

## 2021-02-02 ENCOUNTER — Ambulatory Visit
Admission: RE | Admit: 2021-02-02 | Discharge: 2021-02-02 | Disposition: A | Discharge status: Home / Self Care | Payer: BC Managed Care – PPO | Source: Ambulatory Visit | Attending: Radiation Oncology | Admitting: Radiation Oncology

## 2021-02-02 DIAGNOSIS — Z510 Encounter for antineoplastic radiation therapy: Secondary | ICD-10-CM | POA: Diagnosis not present

## 2021-02-02 DIAGNOSIS — C50111 Malignant neoplasm of central portion of right female breast: Secondary | ICD-10-CM | POA: Diagnosis not present

## 2021-02-02 DIAGNOSIS — Z170 Estrogen receptor positive status [ER+]: Secondary | ICD-10-CM | POA: Diagnosis not present

## 2021-02-05 ENCOUNTER — Ambulatory Visit
Admission: RE | Admit: 2021-02-05 | Discharge: 2021-02-05 | Disposition: A | Discharge status: Home / Self Care | Payer: BC Managed Care – PPO | Source: Ambulatory Visit | Attending: Radiation Oncology | Admitting: Radiation Oncology

## 2021-02-05 ENCOUNTER — Other Ambulatory Visit: Payer: Self-pay

## 2021-02-05 DIAGNOSIS — C50111 Malignant neoplasm of central portion of right female breast: Secondary | ICD-10-CM | POA: Diagnosis not present

## 2021-02-05 DIAGNOSIS — Z170 Estrogen receptor positive status [ER+]: Secondary | ICD-10-CM | POA: Diagnosis not present

## 2021-02-05 DIAGNOSIS — Z510 Encounter for antineoplastic radiation therapy: Secondary | ICD-10-CM | POA: Diagnosis not present

## 2021-02-06 ENCOUNTER — Ambulatory Visit
Admission: RE | Admit: 2021-02-06 | Discharge: 2021-02-06 | Disposition: A | Discharge status: Home / Self Care | Payer: BC Managed Care – PPO | Source: Ambulatory Visit | Attending: Radiation Oncology | Admitting: Radiation Oncology

## 2021-02-06 DIAGNOSIS — C50111 Malignant neoplasm of central portion of right female breast: Secondary | ICD-10-CM | POA: Diagnosis not present

## 2021-02-06 DIAGNOSIS — Z170 Estrogen receptor positive status [ER+]: Secondary | ICD-10-CM | POA: Diagnosis not present

## 2021-02-06 DIAGNOSIS — Z510 Encounter for antineoplastic radiation therapy: Secondary | ICD-10-CM | POA: Diagnosis not present

## 2021-02-07 ENCOUNTER — Other Ambulatory Visit: Payer: Self-pay

## 2021-02-07 ENCOUNTER — Ambulatory Visit
Admission: RE | Admit: 2021-02-07 | Discharge: 2021-02-07 | Disposition: A | Discharge status: Home / Self Care | Payer: BC Managed Care – PPO | Source: Ambulatory Visit | Attending: Radiation Oncology | Admitting: Radiation Oncology

## 2021-02-07 DIAGNOSIS — Z170 Estrogen receptor positive status [ER+]: Secondary | ICD-10-CM | POA: Diagnosis not present

## 2021-02-07 DIAGNOSIS — C50111 Malignant neoplasm of central portion of right female breast: Secondary | ICD-10-CM | POA: Diagnosis not present

## 2021-02-07 DIAGNOSIS — Z510 Encounter for antineoplastic radiation therapy: Secondary | ICD-10-CM | POA: Diagnosis not present

## 2021-02-08 ENCOUNTER — Ambulatory Visit
Admission: RE | Admit: 2021-02-08 | Discharge: 2021-02-08 | Disposition: A | Discharge status: Home / Self Care | Payer: BC Managed Care – PPO | Source: Ambulatory Visit | Attending: Radiation Oncology | Admitting: Radiation Oncology

## 2021-02-08 DIAGNOSIS — Z170 Estrogen receptor positive status [ER+]: Secondary | ICD-10-CM | POA: Diagnosis not present

## 2021-02-08 DIAGNOSIS — C50111 Malignant neoplasm of central portion of right female breast: Secondary | ICD-10-CM | POA: Diagnosis not present

## 2021-02-08 DIAGNOSIS — Z510 Encounter for antineoplastic radiation therapy: Secondary | ICD-10-CM | POA: Diagnosis not present

## 2021-02-09 ENCOUNTER — Other Ambulatory Visit: Payer: Self-pay

## 2021-02-09 ENCOUNTER — Ambulatory Visit
Admission: RE | Admit: 2021-02-09 | Discharge: 2021-02-09 | Disposition: A | Discharge status: Home / Self Care | Payer: BC Managed Care – PPO | Source: Ambulatory Visit | Attending: Radiation Oncology | Admitting: Radiation Oncology

## 2021-02-09 DIAGNOSIS — Z170 Estrogen receptor positive status [ER+]: Secondary | ICD-10-CM | POA: Diagnosis not present

## 2021-02-09 DIAGNOSIS — Z510 Encounter for antineoplastic radiation therapy: Secondary | ICD-10-CM | POA: Diagnosis not present

## 2021-02-09 DIAGNOSIS — C50111 Malignant neoplasm of central portion of right female breast: Secondary | ICD-10-CM | POA: Diagnosis not present

## 2021-02-12 ENCOUNTER — Ambulatory Visit
Admission: RE | Admit: 2021-02-12 | Discharge: 2021-02-12 | Disposition: A | Discharge status: Home / Self Care | Payer: BC Managed Care – PPO | Source: Ambulatory Visit | Attending: Radiation Oncology | Admitting: Radiation Oncology

## 2021-02-12 ENCOUNTER — Other Ambulatory Visit: Payer: Self-pay

## 2021-02-12 DIAGNOSIS — Z170 Estrogen receptor positive status [ER+]: Secondary | ICD-10-CM | POA: Diagnosis not present

## 2021-02-12 DIAGNOSIS — Z510 Encounter for antineoplastic radiation therapy: Secondary | ICD-10-CM | POA: Diagnosis not present

## 2021-02-12 DIAGNOSIS — C50111 Malignant neoplasm of central portion of right female breast: Secondary | ICD-10-CM | POA: Diagnosis not present

## 2021-02-13 ENCOUNTER — Ambulatory Visit
Admission: RE | Admit: 2021-02-13 | Discharge: 2021-02-13 | Disposition: A | Discharge status: Home / Self Care | Payer: BC Managed Care – PPO | Source: Ambulatory Visit | Attending: Radiation Oncology | Admitting: Radiation Oncology

## 2021-02-13 DIAGNOSIS — Z510 Encounter for antineoplastic radiation therapy: Secondary | ICD-10-CM | POA: Diagnosis not present

## 2021-02-13 DIAGNOSIS — Z170 Estrogen receptor positive status [ER+]: Secondary | ICD-10-CM | POA: Diagnosis not present

## 2021-02-13 DIAGNOSIS — C50111 Malignant neoplasm of central portion of right female breast: Secondary | ICD-10-CM | POA: Diagnosis not present

## 2021-02-14 ENCOUNTER — Other Ambulatory Visit: Payer: Self-pay

## 2021-02-14 ENCOUNTER — Ambulatory Visit
Admission: RE | Admit: 2021-02-14 | Discharge: 2021-02-14 | Disposition: A | Discharge status: Home / Self Care | Payer: BC Managed Care – PPO | Source: Ambulatory Visit | Attending: Radiation Oncology | Admitting: Radiation Oncology

## 2021-02-14 DIAGNOSIS — C50111 Malignant neoplasm of central portion of right female breast: Secondary | ICD-10-CM | POA: Diagnosis not present

## 2021-02-14 DIAGNOSIS — Z510 Encounter for antineoplastic radiation therapy: Secondary | ICD-10-CM | POA: Diagnosis not present

## 2021-02-14 DIAGNOSIS — Z170 Estrogen receptor positive status [ER+]: Secondary | ICD-10-CM | POA: Diagnosis not present

## 2021-02-15 ENCOUNTER — Ambulatory Visit
Admission: RE | Admit: 2021-02-15 | Discharge: 2021-02-15 | Disposition: A | Discharge status: Home / Self Care | Payer: BC Managed Care – PPO | Source: Ambulatory Visit | Attending: Radiation Oncology | Admitting: Radiation Oncology

## 2021-02-15 DIAGNOSIS — Z170 Estrogen receptor positive status [ER+]: Secondary | ICD-10-CM | POA: Diagnosis not present

## 2021-02-15 DIAGNOSIS — Z510 Encounter for antineoplastic radiation therapy: Secondary | ICD-10-CM | POA: Diagnosis not present

## 2021-02-15 DIAGNOSIS — C50111 Malignant neoplasm of central portion of right female breast: Secondary | ICD-10-CM | POA: Diagnosis not present

## 2021-02-16 ENCOUNTER — Ambulatory Visit
Admission: RE | Admit: 2021-02-16 | Discharge: 2021-02-16 | Disposition: A | Discharge status: Home / Self Care | Payer: BC Managed Care – PPO | Source: Ambulatory Visit | Attending: Radiation Oncology | Admitting: Radiation Oncology

## 2021-02-16 ENCOUNTER — Other Ambulatory Visit: Payer: Self-pay

## 2021-02-16 DIAGNOSIS — Z510 Encounter for antineoplastic radiation therapy: Secondary | ICD-10-CM | POA: Diagnosis not present

## 2021-02-16 DIAGNOSIS — C50111 Malignant neoplasm of central portion of right female breast: Secondary | ICD-10-CM | POA: Diagnosis not present

## 2021-02-16 DIAGNOSIS — Z170 Estrogen receptor positive status [ER+]: Secondary | ICD-10-CM | POA: Diagnosis not present

## 2021-02-19 ENCOUNTER — Ambulatory Visit: Payer: BC Managed Care – PPO | Admitting: Radiation Oncology

## 2021-02-19 ENCOUNTER — Ambulatory Visit
Admission: RE | Admit: 2021-02-19 | Discharge: 2021-02-19 | Disposition: A | Discharge status: Home / Self Care | Payer: BC Managed Care – PPO | Source: Ambulatory Visit | Attending: Radiation Oncology | Admitting: Radiation Oncology

## 2021-02-19 DIAGNOSIS — Z170 Estrogen receptor positive status [ER+]: Secondary | ICD-10-CM | POA: Diagnosis not present

## 2021-02-19 DIAGNOSIS — Z510 Encounter for antineoplastic radiation therapy: Secondary | ICD-10-CM | POA: Diagnosis not present

## 2021-02-19 DIAGNOSIS — C50111 Malignant neoplasm of central portion of right female breast: Secondary | ICD-10-CM | POA: Diagnosis not present

## 2021-02-19 MED ORDER — RADIAPLEXRX EX GEL
Freq: Once | CUTANEOUS | Status: AC
Start: 2021-02-19 — End: 2021-02-19

## 2021-02-20 ENCOUNTER — Other Ambulatory Visit: Payer: Self-pay

## 2021-02-20 ENCOUNTER — Ambulatory Visit
Admission: RE | Admit: 2021-02-20 | Discharge: 2021-02-20 | Disposition: A | Discharge status: Home / Self Care | Payer: BC Managed Care – PPO | Source: Ambulatory Visit | Attending: Radiation Oncology | Admitting: Radiation Oncology

## 2021-02-20 DIAGNOSIS — Z170 Estrogen receptor positive status [ER+]: Secondary | ICD-10-CM | POA: Diagnosis not present

## 2021-02-20 DIAGNOSIS — C50111 Malignant neoplasm of central portion of right female breast: Secondary | ICD-10-CM | POA: Diagnosis not present

## 2021-02-20 DIAGNOSIS — Z510 Encounter for antineoplastic radiation therapy: Secondary | ICD-10-CM | POA: Diagnosis not present

## 2021-02-21 ENCOUNTER — Ambulatory Visit
Admission: RE | Admit: 2021-02-21 | Discharge: 2021-02-21 | Disposition: A | Discharge status: Home / Self Care | Payer: BC Managed Care – PPO | Source: Ambulatory Visit | Attending: Radiation Oncology | Admitting: Radiation Oncology

## 2021-02-21 DIAGNOSIS — C50111 Malignant neoplasm of central portion of right female breast: Secondary | ICD-10-CM | POA: Diagnosis not present

## 2021-02-21 DIAGNOSIS — Z510 Encounter for antineoplastic radiation therapy: Secondary | ICD-10-CM | POA: Diagnosis not present

## 2021-02-21 DIAGNOSIS — Z170 Estrogen receptor positive status [ER+]: Secondary | ICD-10-CM | POA: Diagnosis not present

## 2021-02-22 ENCOUNTER — Other Ambulatory Visit: Payer: Self-pay

## 2021-02-22 ENCOUNTER — Ambulatory Visit
Admission: RE | Admit: 2021-02-22 | Discharge: 2021-02-22 | Disposition: A | Discharge status: Home / Self Care | Payer: BC Managed Care – PPO | Source: Ambulatory Visit | Attending: Radiation Oncology | Admitting: Radiation Oncology

## 2021-02-22 DIAGNOSIS — Z510 Encounter for antineoplastic radiation therapy: Secondary | ICD-10-CM | POA: Diagnosis not present

## 2021-02-22 DIAGNOSIS — Z170 Estrogen receptor positive status [ER+]: Secondary | ICD-10-CM | POA: Diagnosis not present

## 2021-02-22 DIAGNOSIS — C50111 Malignant neoplasm of central portion of right female breast: Secondary | ICD-10-CM | POA: Diagnosis not present

## 2021-02-23 ENCOUNTER — Ambulatory Visit: Payer: BC Managed Care – PPO | Admitting: Radiation Oncology

## 2021-02-23 ENCOUNTER — Ambulatory Visit
Admission: RE | Admit: 2021-02-23 | Discharge: 2021-02-23 | Disposition: A | Discharge status: Home / Self Care | Payer: BC Managed Care – PPO | Source: Ambulatory Visit | Attending: Radiation Oncology | Admitting: Radiation Oncology

## 2021-02-23 ENCOUNTER — Other Ambulatory Visit: Payer: Self-pay

## 2021-02-23 DIAGNOSIS — C50111 Malignant neoplasm of central portion of right female breast: Secondary | ICD-10-CM | POA: Diagnosis not present

## 2021-02-23 DIAGNOSIS — Z170 Estrogen receptor positive status [ER+]: Secondary | ICD-10-CM | POA: Diagnosis not present

## 2021-02-23 DIAGNOSIS — Z510 Encounter for antineoplastic radiation therapy: Secondary | ICD-10-CM | POA: Diagnosis not present

## 2021-02-26 ENCOUNTER — Ambulatory Visit: Payer: BC Managed Care – PPO | Admitting: Radiation Oncology

## 2021-02-26 ENCOUNTER — Other Ambulatory Visit: Payer: Self-pay

## 2021-02-26 ENCOUNTER — Ambulatory Visit
Admission: RE | Admit: 2021-02-26 | Discharge: 2021-02-26 | Disposition: A | Discharge status: Home / Self Care | Payer: BC Managed Care – PPO | Source: Ambulatory Visit | Attending: Radiation Oncology | Admitting: Radiation Oncology

## 2021-02-26 DIAGNOSIS — Z170 Estrogen receptor positive status [ER+]: Secondary | ICD-10-CM | POA: Diagnosis not present

## 2021-02-26 DIAGNOSIS — C50111 Malignant neoplasm of central portion of right female breast: Secondary | ICD-10-CM | POA: Diagnosis not present

## 2021-02-26 DIAGNOSIS — Z510 Encounter for antineoplastic radiation therapy: Secondary | ICD-10-CM | POA: Diagnosis not present

## 2021-02-27 ENCOUNTER — Ambulatory Visit: Payer: BC Managed Care – PPO

## 2021-02-27 ENCOUNTER — Ambulatory Visit
Admission: RE | Admit: 2021-02-27 | Discharge: 2021-02-27 | Disposition: A | Discharge status: Home / Self Care | Payer: BC Managed Care – PPO | Source: Ambulatory Visit | Attending: Radiation Oncology | Admitting: Radiation Oncology

## 2021-02-27 ENCOUNTER — Encounter: Payer: Self-pay | Admitting: General Practice

## 2021-02-27 ENCOUNTER — Other Ambulatory Visit: Payer: Self-pay | Admitting: Family Medicine

## 2021-02-27 DIAGNOSIS — C50111 Malignant neoplasm of central portion of right female breast: Secondary | ICD-10-CM | POA: Diagnosis not present

## 2021-02-27 DIAGNOSIS — Z170 Estrogen receptor positive status [ER+]: Secondary | ICD-10-CM | POA: Diagnosis not present

## 2021-02-27 DIAGNOSIS — Z510 Encounter for antineoplastic radiation therapy: Secondary | ICD-10-CM | POA: Diagnosis not present

## 2021-02-27 DIAGNOSIS — K219 Gastro-esophageal reflux disease without esophagitis: Secondary | ICD-10-CM

## 2021-02-27 DIAGNOSIS — J301 Allergic rhinitis due to pollen: Secondary | ICD-10-CM

## 2021-02-27 DIAGNOSIS — R7303 Prediabetes: Secondary | ICD-10-CM

## 2021-02-27 NOTE — Progress Notes (Signed)
[facility] Note  Nathaneil Canary by phone per referral from [facility], providing empathic listening, pastoral reflection, emotional support, and encouragement as she shared and processed life review about challenges she has faced and overcome with grace, empathy, and a spiritual gift for encouraging others. Will mail her a full packet of [facility] support programming, info including Finding Your New Normal [facility]) brochure (post-treatment survivorship support series). Encouraged Tenn[place] to reach out whenever needed/desired for spiritual and emotional support related to meaning-making, coping, healing, and thriving.    [place], [name], [facility] Pager [phone] Voicemail [phone]

## 2021-02-28 ENCOUNTER — Other Ambulatory Visit: Payer: Self-pay

## 2021-02-28 ENCOUNTER — Ambulatory Visit
Admission: RE | Admit: 2021-02-28 | Discharge: 2021-02-28 | Disposition: A | Discharge status: Home / Self Care | Payer: BC Managed Care – PPO | Source: Ambulatory Visit | Attending: Radiation Oncology | Admitting: Radiation Oncology

## 2021-02-28 DIAGNOSIS — Z510 Encounter for antineoplastic radiation therapy: Secondary | ICD-10-CM | POA: Diagnosis not present

## 2021-02-28 DIAGNOSIS — C50111 Malignant neoplasm of central portion of right female breast: Secondary | ICD-10-CM | POA: Diagnosis not present

## 2021-02-28 DIAGNOSIS — Z170 Estrogen receptor positive status [ER+]: Secondary | ICD-10-CM | POA: Diagnosis not present

## 2021-02-28 NOTE — Progress Notes (Signed)
Patient Care Team: [name], NP as PCP - General (Nurse Practitioner) Mauro Kaufmann, RN as Oncology Nurse Navigator Rockwell Germany, RN as Oncology Nurse Navigator  DIAGNOSIS:    ICD-10-CM   1. Malignant neoplasm of central portion of right breast in female, estrogen receptor positive ([place])  C50.111    Z17.0       SUMMARY OF ONCOLOGIC HISTORY: Oncology History  Malignant neoplasm of central portion of right breast in female, estrogen receptor positive ([place])  07/04/2020 Initial Diagnosis   Screening mammogram detected right breast mass and right axillary lymph nodes.  Right breast nipple retraction and retroareolar thickening.  2.4 cm mass with 2 abnormal lymph nodes.  Biopsy revealed invasive lobular cancer grade 1-2 ER 95%, PR 95%, Ki-67 30%, HER2 equivocal by IHC negative by FISH ratio 1.39   07/17/2020 Cancer Staging   Staging form: Breast, AJCC 8th Edition - Clinical stage from 07/17/2020: Stage IIA (cT2, cN1, cM0, G2, ER+, PR+, HER2-) - Signed by Nicholas Lose, MD on 07/17/2020   08/09/2020 Surgery   Rght lumpectomy ([place]): invasive lobular carcinoma, 3.8cm, clear margins, 19/19 right axillary lymph nodes positive for metastatic carcinoma.    08/09/2020 Cancer Staging   Staging form: Breast, AJCC 8th Edition - Pathologic stage from 08/09/2020: Stage IIIA (pT2, pN3a, cM0, G2, ER+, PR+, HER2-) - Signed by Gardenia Phlegm, NP on 08/23/2020 Stage prefix: Initial diagnosis Histologic grading system: 3 grade system   09/15/2020 - 11/24/2020 Chemotherapy   Adjuvant chemotherapy with TC Q21D x4       01/25/2021 - 03/07/2021 Radiation Therapy   Adjuvant radiation     CHIEF COMPLIANT: Follow-up of breast cancer  INTERVAL HISTORY: Shelby [name] is a 50 y.o. with above-mentioned history of breast cancer. Adjuvant chemotherapy with Taxotere and Cytoxan was stopped due to a right breast abscess. She presents to the clinic today for follow-up.  She does have radiation  dermatitis.  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  ALLERGIES:  is allergic to codeine and peach flavor.  MEDICATIONS:  Current Outpatient Medications  Medication Sig Dispense Refill   ALPRAZolam (XANAX) 0.5 MG tablet Take 1 tablet (0.5 mg total) by mouth at bedtime as needed for anxiety. 40 tablet 0   buPROPion (WELLBUTRIN XL) 150 MG 24 hr tablet Take 1 tablet (150 mg total) by mouth daily. 60 tablet 0   cephALEXin (KEFLEX) 500 MG capsule Take 1 capsule (500 mg total) by mouth 2 (two) times daily. 14 capsule 0   cetirizine (ZYRTEC) 10 MG tablet Take 1 tablet (10 mg total) by mouth daily. 30 tablet 0   citalopram (CELEXA) 20 MG tablet Take 1 tablet (20 mg total) by mouth daily. (Needs to be seen before next refill) 30 tablet 11   dexamethasone (DECADRON) 4 MG tablet Take 2 tablets (8 mg total) by mouth daily. Take 1 tablet day before chemo and 1 tablet day after chemo with food 12 tablet 0   fluticasone (FLONASE) 50 MCG/ACT nasal spray Place 2 sprays into both nostrils daily. 16 g 6   ibuprofen (ADVIL) 800 MG tablet Take 1 tablet (800 mg total) by mouth every 8 (eight) hours as needed. 30 tablet 0   lidocaine-prilocaine (EMLA) cream Apply to affected area once 30 g 3   loratadine (CLARITIN) 10 MG tablet Take 1 tablet (10 mg total) by mouth daily. 30 tablet 11   metFORMIN (GLUCOPHAGE) 500 MG tablet Take 1 tablet (500 mg total) by mouth 2 (two)  times daily with a meal. (NEEDS TO BE SEEN BEFORE NEXT REFILL) 60 tablet 0   metoprolol tartrate (LOPRESSOR) 50 MG tablet Take 1 tablet (50 mg total) by mouth 2 (two) times daily. 60 tablet 6   mupirocin cream (BACTROBAN) 2 % Apply 1 application topically 2 (two) times daily. 15 g 2   nystatin (MYCOSTATIN/NYSTOP) powder Apply 1 application topically 2 (two) times daily. 15 g 4   ondansetron (ZOFRAN) 8 MG tablet Take 1 tablet (8 mg total) by mouth 2 (two) times daily as needed for refractory nausea / vomiting. Start on day 3 after chemo.  30 tablet 1   pantoprazole (PROTONIX) 40 MG tablet Take 1 tablet (40 mg total) by mouth daily. (NEEDS TO BE SEEN BEFORE NEXT REFILL) 30 tablet 0   prochlorperazine (COMPAZINE) 10 MG tablet Take 1 tablet (10 mg total) by mouth every 6 (six) hours as needed (Nausea or vomiting). 30 tablet 1   tolterodine (DETROL LA) 2 MG 24 hr capsule Take 1 capsule (2 mg total) by mouth daily. 60 capsule 1   traMADol (ULTRAM) 50 MG tablet Take 1 tablet (50 mg total) by mouth every 6 (six) hours as needed. 20 tablet 0   No current facility-administered medications for this visit.    PHYSICAL EXAMINATION: ECOG PERFORMANCE STATUS: 1 - Symptomatic but completely ambulatory  Vitals:   03/02/21 0809  BP: (!) 131/95  Pulse: 76  Resp: 20  Temp: 97.9 F (36.6 C)  SpO2: 100%   Filed Weights   03/02/21 0809  Weight: (!) 394 lb 8 oz (178.9 kg)      LABORATORY DATA:  I have reviewed the data as listed CMP Latest Ref Rng & Units 12/15/2020 11/24/2020 11/03/2020  Glucose 70 - 99 mg/dL 106(H) 112(H) 119(H)  BUN 6 - 20 mg/dL 14 14 12   Creatinine 0.44 - 1.00 mg/dL 0.76 0.74 0.71  Sodium 135 - 145 mmol/L 138 142 141  Potassium 3.5 - 5.1 mmol/L 4.0 3.9 4.1  Chloride 98 - 111 mmol/L 105 107 108  CO2 22 - 32 mmol/L 25 23 24   Calcium 8.9 - 10.3 mg/dL 8.8(L) 9.3 9.1  Total Protein 6.5 - 8.1 g/dL 6.8 7.1 7.2  Total Bilirubin 0.3 - 1.2 mg/dL 0.3 0.5 0.4  Alkaline Phos 38 - 126 U/L 56 83 82  AST 15 - 41 U/L 17 13(L) 13(L)  ALT 0 - 44 U/L 17 17 17     Lab Results  Component Value Date   WBC 8.0 12/15/2020   HGB 10.5 (L) 12/15/2020   HCT 32.3 (L) 12/15/2020   MCV 93.9 12/15/2020   PLT 374 12/15/2020   NEUTROABS 5.3 12/15/2020    ASSESSMENT & PLAN:  Malignant neoplasm of central portion of right breast in female, estrogen receptor positive ([place]) 07/04/2020:Screening mammogram detected right breast mass and right axillary lymph nodes.  Right breast nipple retraction and retroareolar thickening.  2.4 cm mass with 2  abnormal lymph nodes.  Biopsy revealed invasive lobular cancer grade 1-2 ER 95%, PR 95%, Ki-67 30%, HER2 equivocal by IHC negative by FISH ratio 1.39 T2N1 stage IIA   Right lumpectomy: 08/09/2020: ILC 3.8 cm, 19/19 LN Positive, Margins Neg   Plan: 1. Staging scans 2. Adjuvant chemo with TC X 4 completed 11/24/2020 3. XRT 01/25/2021- 03/07/21 4. Adj Anti estrogen therapy (with Verzenio) ------------------------------------------------------------------------------------------------------------------------------------ 08/30/2020: CT CAP: No evidence of metastatic disease 08/30/2020: Bone scan: No evidence of bone metastases --------------------------------------------------------------------------------------------------------------------  Anastrozole counseling: We discussed the risks and benefits of  anti-estrogen therapy with aromatase inhibitors. These include but not limited to insomnia, hot flashes, mood changes, vaginal dryness, bone density loss, and weight gain. We strongly believe that the benefits far outweigh the risks. Patient understands these risks and consented to starting treatment. Planned treatment duration is 7 years.  Abemaciclib counseling: I discussed at length the risks and benefits of Abemaciclib in combination with letrozole. Adverse effects of Abemaciclib include decreasing neutrophil count, pneumonia, blood clots in lungs as well as nausea and GI symptoms. Side effects of letrozole include hot flashes, muscle aches and pains, uterine bleeding/spotting/cancer, osteoporosis, risk of blood clots. We will consider starting abemaciclib in 6 months.  I recommended participation in the phase 2 ACCRU [place] 2102 study of N-Palmitoylrthanolamide vs placebo for the treatment of chemo induced peripheral neuropathy.  We will provide her with information and see if she is eligible to participate.   Return to clinic in 3 months for survivorship care plan visit   No orders of the defined types  were placed in this encounter.  The patient has a good understanding of the overall plan. she agrees with it. she will call with any problems that may develop before the next visit here.  Total time spent: 30 mins including face to face time and time spent for planning, charting and coordination of care  Rulon Eisenmenger, MD, MPH 03/02/2021  I, Thana Ates, am acting as scribe for Dr. Nicholas Lose.  I have reviewed the above documentation for accuracy and completeness, and I agree with the above.

## 2021-03-01 ENCOUNTER — Ambulatory Visit
Admission: RE | Admit: 2021-03-01 | Discharge: 2021-03-01 | Disposition: A | Discharge status: Home / Self Care | Payer: BC Managed Care – PPO | Source: Ambulatory Visit | Attending: Radiation Oncology | Admitting: Radiation Oncology

## 2021-03-01 ENCOUNTER — Encounter: Payer: Self-pay | Admitting: *Deleted

## 2021-03-01 DIAGNOSIS — C50111 Malignant neoplasm of central portion of right female breast: Secondary | ICD-10-CM

## 2021-03-01 DIAGNOSIS — Z170 Estrogen receptor positive status [ER+]: Secondary | ICD-10-CM | POA: Diagnosis not present

## 2021-03-01 NOTE — Assessment & Plan Note (Signed)
07/04/2020:Screening mammogram detected right breast mass and right axillary lymph nodes. Right breast nipple retraction and retroareolar thickening. 2.4 cm mass with 2 abnormal lymph nodes. Biopsy revealed invasive lobular cancer grade 1-2 ER 95%, PR 95%, Ki-67 30%, HER2 equivocal by IHC negative by FISH ratio 1.39 T2N1 stageIIA  Right lumpectomy: 08/09/2020:ILC 3.8 cm, 19/19 LN Positive, Margins Neg  Plan: 1. Staging scans 2. Adjuvant chemo with TC X 6(because of her propensity for diabetes she may only end up getting 4) 3. XRT 01/25/2021- 03/07/21 4. Adj Anti estrogen therapy(with Verzenio) ------------------------------------------------------------------------------------------------------------------------------------ 08/30/2020: CT CAP:No evidence of metastatic disease 08/30/2020: Bone scan:No evidence of bone metastases --------------------------------------------------------------------------------------------------------------------  Anastrozole counseling: We discussed the risks and benefits of anti-estrogen therapy with aromatase inhibitors. These include but not limited to insomnia, hot flashes, mood changes, vaginal dryness, bone density loss, and weight gain. We strongly believe that the benefits far outweigh the risks. Patient understands these risks and consented to starting treatment. Planned treatment duration is 7 years.  Abemaciclib counseling: I discussed at length the risks and benefits of Abemaciclib in combination with letrozole. Adverse effects of Abemaciclib include decreasing neutrophil count, pneumonia, blood clots in lungs as well as nausea and GI symptoms. Side effects of letrozole include hot flashes, muscle aches and pains, uterine bleeding/spotting/cancer, osteoporosis, risk of blood clots.

## 2021-03-02 ENCOUNTER — Ambulatory Visit
Admission: RE | Admit: 2021-03-02 | Discharge: 2021-03-02 | Disposition: A | Discharge status: Home / Self Care | Payer: BC Managed Care – PPO | Source: Ambulatory Visit | Attending: Radiation Oncology | Admitting: Radiation Oncology

## 2021-03-02 ENCOUNTER — Encounter: Payer: Self-pay | Admitting: *Deleted

## 2021-03-02 ENCOUNTER — Other Ambulatory Visit: Payer: Self-pay

## 2021-03-02 ENCOUNTER — Ambulatory Visit: Payer: BC Managed Care – PPO

## 2021-03-02 ENCOUNTER — Inpatient Hospital Stay: Payer: BC Managed Care – PPO | Attending: Hematology and Oncology | Admitting: Hematology and Oncology

## 2021-03-02 DIAGNOSIS — Z9221 Personal history of antineoplastic chemotherapy: Secondary | ICD-10-CM | POA: Insufficient documentation

## 2021-03-02 DIAGNOSIS — Z170 Estrogen receptor positive status [ER+]: Secondary | ICD-10-CM | POA: Diagnosis not present

## 2021-03-02 DIAGNOSIS — G620 Drug-induced polyneuropathy: Secondary | ICD-10-CM | POA: Diagnosis not present

## 2021-03-02 DIAGNOSIS — Z923 Personal history of irradiation: Secondary | ICD-10-CM | POA: Insufficient documentation

## 2021-03-02 DIAGNOSIS — T451X5A Adverse effect of antineoplastic and immunosuppressive drugs, initial encounter: Secondary | ICD-10-CM | POA: Insufficient documentation

## 2021-03-02 DIAGNOSIS — C773 Secondary and unspecified malignant neoplasm of axilla and upper limb lymph nodes: Secondary | ICD-10-CM | POA: Diagnosis not present

## 2021-03-02 DIAGNOSIS — C50111 Malignant neoplasm of central portion of right female breast: Secondary | ICD-10-CM

## 2021-03-02 MED ORDER — BUPROPION HCL ER (XL) 150 MG PO TB24: 150.0000 mg | ORAL_TABLET | Freq: Every day | ORAL | 3 refills | Status: DC

## 2021-03-02 MED ORDER — LETROZOLE 2.5 MG PO TABS
2.5000 mg | ORAL_TABLET | Freq: Every day | ORAL | 3 refills | Status: DC
Start: 2021-03-02 — End: 2021-11-27

## 2021-03-02 NOTE — Research (Unsigned)
Trial:  ACCRU-[place]-2102 - TREATMENT OF ESTABLISHED CHEMOTHERAPY-INDUCED NEUROPATHY WITH N-PALMITOYLETHANOLAMIDE, A CANNABIMIMETIC NUTRACEUTICAL: A RANDOMIZED DOUBLE-BLIND PHASE II PILOT TRIAL  [name] [name] was identified by Dr. Lindi Adie as a potential candidate for the above listed study.  This Clinical Research Nurse met with ANAAYA FUSTER, [id number], on 03/02/21 in a manner and location that ensures patient privacy to discuss participation in the above listed research study.  Patient is Accompanied by her spouse .  A copy of the informed consent document and separate HIPAA Authorization was provided to the patient.  Patient reads, speaks, and understands [place].   Patient was provided with the business card of this Nurse and encouraged to contact the research team with any questions.  Approximately 10 minutes were spent with the patient reviewing the informed consent documents.  Patient was provided the option of taking informed consent documents home to review and was encouraged to review at their convenience with their support network, including other care providers. Patient took the consent documents home to review.  Thanked patient for her time and she agreed for research nurse to contact her in a few weeks to follow up on her interest in this study.  Foye Spurling, BSN, RN Clinical Research Nurse 03/02/2021 11:45 AM

## 2021-03-05 ENCOUNTER — Ambulatory Visit: Payer: BC Managed Care – PPO

## 2021-03-06 ENCOUNTER — Other Ambulatory Visit: Payer: Self-pay | Admitting: Hematology and Oncology

## 2021-03-06 ENCOUNTER — Telehealth: Payer: Self-pay | Admitting: Hematology and Oncology

## 2021-03-06 ENCOUNTER — Ambulatory Visit: Payer: BC Managed Care – PPO

## 2021-03-06 ENCOUNTER — Ambulatory Visit
Admission: RE | Admit: 2021-03-06 | Discharge: 2021-03-06 | Disposition: A | Discharge status: Home / Self Care | Payer: BC Managed Care – PPO | Source: Ambulatory Visit | Attending: Radiation Oncology | Admitting: Radiation Oncology

## 2021-03-06 ENCOUNTER — Other Ambulatory Visit: Payer: Self-pay

## 2021-03-06 ENCOUNTER — Other Ambulatory Visit: Payer: Self-pay | Admitting: *Deleted

## 2021-03-06 DIAGNOSIS — C50111 Malignant neoplasm of central portion of right female breast: Secondary | ICD-10-CM | POA: Diagnosis not present

## 2021-03-06 DIAGNOSIS — R7303 Prediabetes: Secondary | ICD-10-CM

## 2021-03-06 DIAGNOSIS — Z170 Estrogen receptor positive status [ER+]: Secondary | ICD-10-CM

## 2021-03-06 MED ORDER — METFORMIN HCL 500 MG PO TABS: 500.0000 mg | ORAL_TABLET | Freq: Two times a day (BID) | ORAL | 3 refills | Status: DC

## 2021-03-06 MED ORDER — RADIAPLEXRX EX GEL: Freq: Once | CUTANEOUS | Status: AC

## 2021-03-06 NOTE — Telephone Encounter (Signed)
Scheduled per 9/2 los. Called and spoke with pt confirmed 12/2 appt

## 2021-03-07 ENCOUNTER — Ambulatory Visit
Admission: RE | Admit: 2021-03-07 | Discharge: 2021-03-07 | Disposition: A | Discharge status: Home / Self Care | Payer: BC Managed Care – PPO | Source: Ambulatory Visit | Attending: Radiation Oncology | Admitting: Radiation Oncology

## 2021-03-07 ENCOUNTER — Encounter: Payer: Self-pay | Admitting: Radiation Oncology

## 2021-03-07 DIAGNOSIS — Z170 Estrogen receptor positive status [ER+]: Secondary | ICD-10-CM | POA: Diagnosis not present

## 2021-03-07 DIAGNOSIS — C50111 Malignant neoplasm of central portion of right female breast: Secondary | ICD-10-CM | POA: Diagnosis not present

## 2021-04-02 ENCOUNTER — Telehealth: Payer: Self-pay | Admitting: *Deleted

## 2021-04-02 NOTE — Telephone Encounter (Signed)
ACCRU-[place]-2102 - TREATMENT OF ESTABLISHED CHEMOTHERAPY-INDUCED NEUROPATHY WITH N-PALMITOYLETHANOLAMIDE, A CANNABIMIMETIC NUTRACEUTICAL: A RANDOMIZED DOUBLE-BLIND PHASE II PILOT TRIAL  Called patient to follow up on her interest in the above study.  Patient reports her neuropathy has been improving. It is not completely resolved, but she would consider it "mild" now. It doesn't bother her as much as it did when she last saw Dr. Lindi Adie one month ago. Patient understands she would not be eligible for the study since her neuropathy is improving on it's own, nor does she want to participate since it is getting better with time. Thanked patient for her time and willingness to discuss this study. Dr. Lindi Adie notified.  Foye Spurling, BSN, RN Clinical Research Nurse 04/02/2021 1:51 PM

## 2021-04-04 ENCOUNTER — Other Ambulatory Visit: Payer: Self-pay

## 2021-04-04 ENCOUNTER — Other Ambulatory Visit: Payer: Self-pay | Admitting: Nurse Practitioner

## 2021-04-04 ENCOUNTER — Encounter (HOSPITAL_COMMUNITY): Payer: Self-pay | Admitting: Surgery

## 2021-04-04 DIAGNOSIS — I10 Essential (primary) hypertension: Secondary | ICD-10-CM

## 2021-04-04 DIAGNOSIS — K219 Gastro-esophageal reflux disease without esophagitis: Secondary | ICD-10-CM

## 2021-04-04 DIAGNOSIS — R002 Palpitations: Secondary | ICD-10-CM

## 2021-04-04 NOTE — Telephone Encounter (Signed)
Je NTBS 30 days given 02/27/21

## 2021-04-04 NOTE — Progress Notes (Signed)
PCP - Dr. Lenna Sciara- [facility]  Cardiologist - Denies  EP-Denies  Endocrine- Denies  Pulm-Denies  Chest x-ray - Denies  EKG - 08/03/20 (E)  Stress Test - Denies  ECHO - Denies  Cardiac Cath - 6/158/13 (E)  AICD- na PM- na LOOP- na  Dialysis-Denies  Sleep Study - Denies CPAP - Denies  LABS- 04/05/21: CBC, BMP  ASA-Denies  ERAS- Yes-clears until 1145  HA1C-08/03/20 (E): 5.7 Fasting Blood Sugar - Denies Checks Blood Sugar ___0__ times a day- Pt states she is pre-diabetic and does not have check her blood sugar, but she does have a meter.  Anesthesia- Yes- EKG- NP Levada Dy made aware.  Pt denies having chest pain, sob, or fever during the pre-op phone call. All instructions explained to the pt, with a verbal understanding of the material including: as of today, stop taking all Aspirin (unless instructed by your doctor) and Other Aspirin containing products, Vitamins, Fish oils, and Herbal medications. Also stop all NSAIDS i.e. Advil, Ibuprofen, Motrin, Aleve, Anaprox, Naproxen, BC, Goody Powders, and all Supplements.    WHAT DO I DO ABOUT MY DIABETES MEDICATION?  Do not take Metformin the morning of surgery.  How do I manage my blood sugar before surgery? Check your blood sugar at least 4 times a day, starting 2 days before surgery, to make sure that the level is not too high or low. Check your blood sugar the morning of your surgery when you wake up and every 2 hours until you get to the Short Stay unit. If your blood sugar is less than 70 mg/dL, you will need to treat for low blood sugar: Do not take insulin. Treat a low blood sugar (less than 70 mg/dL) with  cup of clear juice (cranberry or apple), 4 glucose tablets, OR glucose gel. Recheck blood sugar in 15 minutes after treatment (to make sure it is greater than 70 mg/dL). If your blood sugar is not greater than 70 mg/dL on recheck, call [phone]  for further instructions.       If your CBG is greater  than 220 mg/dL, call the number above for further instructions.  If you are admitted to the hospital after surgery: Your blood sugar will be checked by the staff and you will probably be given insulin after surgery (instead of oral diabetes medicines) to make sure you have good blood sugar levels. The goal for blood sugar control after surgery is 80-180 mg/dl   Reviewed and Endorsed by [facility] Patient Education Committee, August 2015   Pt also instructed to wear a mask and social distance if she needs to go out before her procedure. The opportunity to ask questions was provided.   Coronavirus Screening  Have you experienced the following symptoms:  Cough yes/no: No Fever (>100.37F)  yes/no: No Runny nose yes/no: No Sore throat yes/no: No Difficulty breathing/shortness of breath  yes/no: No  Have you or a family member traveled in the last 14 days and where? yes/no: No   If the patient indicates "YES" to the above questions, their PAT will be rescheduled to limit the exposure to others and, the surgeon will be notified. THE PATIENT WILL NEED TO BE ASYMPTOMATIC FOR 14 DAYS.   If the patient is not experiencing any of these symptoms, the PAT nurse will instruct them to NOT bring anyone with them to their appointment since they may have these symptoms or traveled as well.   Please remind your patients and families that hospital visitation  restrictions are in effect and the importance of the restrictions.

## 2021-04-05 ENCOUNTER — Telehealth: Payer: Self-pay

## 2021-04-05 ENCOUNTER — Encounter (HOSPITAL_COMMUNITY): Payer: Self-pay | Admitting: Surgery

## 2021-04-05 ENCOUNTER — Encounter (HOSPITAL_COMMUNITY)
Admission: RE | Disposition: A | Discharge status: Home / Self Care | Payer: Self-pay | Source: Home / Self Care | Attending: Surgery

## 2021-04-05 ENCOUNTER — Ambulatory Visit (HOSPITAL_COMMUNITY): Payer: BC Managed Care – PPO | Admitting: Emergency Medicine

## 2021-04-05 ENCOUNTER — Ambulatory Visit (HOSPITAL_COMMUNITY)
Admission: RE | Admit: 2021-04-05 | Discharge: 2021-04-05 | Disposition: A | Discharge status: Home / Self Care | Payer: BC Managed Care – PPO | Attending: Surgery | Admitting: Surgery

## 2021-04-05 DIAGNOSIS — I10 Essential (primary) hypertension: Secondary | ICD-10-CM | POA: Diagnosis not present

## 2021-04-05 DIAGNOSIS — Z853 Personal history of malignant neoplasm of breast: Secondary | ICD-10-CM | POA: Insufficient documentation

## 2021-04-05 DIAGNOSIS — Z9221 Personal history of antineoplastic chemotherapy: Secondary | ICD-10-CM | POA: Insufficient documentation

## 2021-04-05 DIAGNOSIS — Z452 Encounter for adjustment and management of vascular access device: Secondary | ICD-10-CM | POA: Insufficient documentation

## 2021-04-05 DIAGNOSIS — C50111 Malignant neoplasm of central portion of right female breast: Secondary | ICD-10-CM | POA: Diagnosis not present

## 2021-04-05 DIAGNOSIS — Z7984 Long term (current) use of oral hypoglycemic drugs: Secondary | ICD-10-CM | POA: Diagnosis not present

## 2021-04-05 DIAGNOSIS — Z885 Allergy status to narcotic agent status: Secondary | ICD-10-CM | POA: Insufficient documentation

## 2021-04-05 DIAGNOSIS — Z79899 Other long term (current) drug therapy: Secondary | ICD-10-CM | POA: Insufficient documentation

## 2021-04-05 DIAGNOSIS — Z170 Estrogen receptor positive status [ER+]: Secondary | ICD-10-CM | POA: Diagnosis not present

## 2021-04-05 HISTORY — DX: Gastro-esophageal reflux disease without esophagitis: K21.9

## 2021-04-05 HISTORY — PX: PORT-A-CATH REMOVAL: SHX5289

## 2021-04-05 HISTORY — DX: Prediabetes: R73.03

## 2021-04-05 LAB — CBC
HCT: 44.1 % (ref 36.0–46.0)
Hemoglobin: 14.3 g/dL (ref 12.0–15.0)
MCH: 28.8 pg (ref 26.0–34.0)
MCHC: 32.4 g/dL (ref 30.0–36.0)
MCV: 88.7 fL (ref 80.0–100.0)
Platelets: 224 10*3/uL (ref 150–400)
RBC: 4.97 MIL/uL (ref 3.87–5.11)
RDW: 13.3 % (ref 11.5–15.5)
WBC: 7.2 10*3/uL (ref 4.0–10.5)
nRBC: 0 % (ref 0.0–0.2)

## 2021-04-05 LAB — BASIC METABOLIC PANEL
Anion gap: 11 (ref 5–15)
BUN: 12 mg/dL (ref 6–20)
CO2: 22 mmol/L (ref 22–32)
Calcium: 9.1 mg/dL (ref 8.9–10.3)
Chloride: 105 mmol/L (ref 98–111)
Creatinine, Ser: 0.69 mg/dL (ref 0.44–1.00)
GFR, Estimated: >60 mL/min (ref >60)
Glucose, Bld: 109 mg/dL — ABNORMAL HIGH (ref 70–99)
Potassium: 4.4 mmol/L (ref 3.5–5.1)
Sodium: 138 mmol/L (ref 135–145)

## 2021-04-05 LAB — GLUCOSE, CAPILLARY: Glucose-Capillary: 116 mg/dL — ABNORMAL HIGH (ref 70–99)

## 2021-04-05 SURGERY — REMOVAL PORT-A-CATH
Anesthesia: Monitor Anesthesia Care

## 2021-04-05 MED ORDER — FENTANYL CITRATE (PF) 100 MCG/2ML IJ SOLN
INTRAMUSCULAR | Status: AC
  Filled 2021-04-05: qty 2

## 2021-04-05 MED ORDER — ONDANSETRON HCL 4 MG/2ML IJ SOLN
INTRAMUSCULAR | Status: DC | PRN
  Administered 2021-04-05: 4 mg via INTRAVENOUS

## 2021-04-05 MED ORDER — CHLORHEXIDINE GLUCONATE CLOTH 2 % EX PADS: 6.0000 | MEDICATED_PAD | Freq: Once | CUTANEOUS | Status: DC

## 2021-04-05 MED ORDER — MIDAZOLAM HCL 5 MG/5ML IJ SOLN
INTRAMUSCULAR | Status: DC | PRN
  Administered 2021-04-05: 2 mg via INTRAVENOUS

## 2021-04-05 MED ORDER — CHLORHEXIDINE GLUCONATE 0.12 % MT SOLN
15.0000 mL | Freq: Once | OROMUCOSAL | Status: AC
  Administered 2021-04-05: 15 mL via OROMUCOSAL
  Filled 2021-04-05: qty 15

## 2021-04-05 MED ORDER — PROPOFOL 500 MG/50ML IV EMUL
INTRAVENOUS | Status: DC | PRN
  Administered 2021-04-05: 75 ug/kg/min via INTRAVENOUS

## 2021-04-05 MED ORDER — BUPIVACAINE-EPINEPHRINE (PF) 0.25% -1:200000 IJ SOLN
INTRAMUSCULAR | Status: AC
  Filled 2021-04-05: qty 30

## 2021-04-05 MED ORDER — ACETAMINOPHEN 325 MG PO TABS: 325.0000 mg | ORAL_TABLET | ORAL | Status: DC | PRN

## 2021-04-05 MED ORDER — OXYCODONE HCL 5 MG PO TABS
ORAL_TABLET | ORAL | Status: AC
  Filled 2021-04-05: qty 1

## 2021-04-05 MED ORDER — BUPIVACAINE-EPINEPHRINE 0.25% -1:200000 IJ SOLN
INTRAMUSCULAR | Status: DC | PRN
  Administered 2021-04-05: 10 mL

## 2021-04-05 MED ORDER — ACETAMINOPHEN 10 MG/ML IV SOLN
INTRAVENOUS | Status: AC
  Filled 2021-04-05: qty 100

## 2021-04-05 MED ORDER — ORAL CARE MOUTH RINSE: 15.0000 mL | Freq: Once | OROMUCOSAL | Status: AC

## 2021-04-05 MED ORDER — FENTANYL CITRATE (PF) 100 MCG/2ML IJ SOLN
25.0000 ug | INTRAMUSCULAR | Status: DC | PRN
  Administered 2021-04-05 (×2): 50 ug via INTRAVENOUS

## 2021-04-05 MED ORDER — ONDANSETRON HCL 4 MG/2ML IJ SOLN
INTRAMUSCULAR | Status: AC
  Filled 2021-04-05: qty 2

## 2021-04-05 MED ORDER — ACETAMINOPHEN 160 MG/5ML PO SOLN: 325.0000 mg | ORAL | Status: DC | PRN

## 2021-04-05 MED ORDER — PROMETHAZINE HCL 25 MG/ML IJ SOLN: 6.2500 mg | INTRAMUSCULAR | Status: DC | PRN

## 2021-04-05 MED ORDER — AMISULPRIDE (ANTIEMETIC) 5 MG/2ML IV SOLN: 10.0000 mg | Freq: Once | INTRAVENOUS | Status: DC | PRN

## 2021-04-05 MED ORDER — LIDOCAINE 2% (20 MG/ML) 5 ML SYRINGE
INTRAMUSCULAR | Status: DC | PRN
Start: 2021-04-05 — End: 2021-04-05
  Administered 2021-04-05: 60 mg via INTRAVENOUS

## 2021-04-05 MED ORDER — LACTATED RINGERS IV SOLN: INTRAVENOUS | Status: DC

## 2021-04-05 MED ORDER — OXYCODONE HCL 5 MG/5ML PO SOLN
5.0000 mg | Freq: Once | ORAL | Status: AC | PRN
Start: 2021-04-05 — End: 2021-04-05

## 2021-04-05 MED ORDER — PROPOFOL 10 MG/ML IV BOLUS
INTRAVENOUS | Status: AC
  Filled 2021-04-05: qty 20

## 2021-04-05 MED ORDER — ACETAMINOPHEN 10 MG/ML IV SOLN
1000.0000 mg | Freq: Once | INTRAVENOUS | Status: DC | PRN
  Administered 2021-04-05: 1000 mg via INTRAVENOUS

## 2021-04-05 MED ORDER — CEFAZOLIN IN SODIUM CHLORIDE 3-0.9 GM/100ML-% IV SOLN
3.0000 g | INTRAVENOUS | Status: AC
  Administered 2021-04-05: 3 g via INTRAVENOUS
  Filled 2021-04-05: qty 100

## 2021-04-05 MED ORDER — IBUPROFEN 800 MG PO TABS: 800.0000 mg | ORAL_TABLET | Freq: Three times a day (TID) | ORAL | 0 refills | Status: DC | PRN

## 2021-04-05 MED ORDER — MIDAZOLAM HCL 2 MG/2ML IJ SOLN
INTRAMUSCULAR | Status: AC
  Filled 2021-04-05: qty 2

## 2021-04-05 MED ORDER — OXYCODONE HCL 5 MG PO TABS
5.0000 mg | ORAL_TABLET | Freq: Once | ORAL | Status: AC | PRN
  Administered 2021-04-05: 5 mg via ORAL

## 2021-04-05 MED ORDER — LIDOCAINE 2% (20 MG/ML) 5 ML SYRINGE
INTRAMUSCULAR | Status: AC
  Filled 2021-04-05: qty 5

## 2021-04-05 SURGICAL SUPPLY — 33 items
ADH SKN CLS APL DERMABOND .7 (GAUZE/BANDAGES/DRESSINGS) ×1
BAG COUNTER SPONGE SURGICOUNT (BAG) ×2 IMPLANT
BAG SPNG CNTER NS LX DISP (BAG) ×1
CHLORAPREP W/TINT 10.5 ML (MISCELLANEOUS) ×2 IMPLANT
COVER SURGICAL LIGHT HANDLE (MISCELLANEOUS) ×2 IMPLANT
DECANTER SPIKE VIAL GLASS SM (MISCELLANEOUS) ×2 IMPLANT
DERMABOND ADVANCED (GAUZE/BANDAGES/DRESSINGS) ×1
DERMABOND ADVANCED .7 DNX12 (GAUZE/BANDAGES/DRESSINGS) ×1 IMPLANT
DRAPE LAPAROTOMY 100X72 PEDS (DRAPES) ×2 IMPLANT
ELECT REM PT RETURN 9FT ADLT (ELECTROSURGICAL) ×2
ELECTRODE REM PT RTRN 9FT ADLT (ELECTROSURGICAL) ×1 IMPLANT
GAUZE 4X4 16PLY ~~LOC~~+RFID DBL (SPONGE) ×2 IMPLANT
GLOVE SRG 8 PF TXTR STRL LF DI (GLOVE) ×1 IMPLANT
GLOVE SURG ENC MOIS LTX SZ8 (GLOVE) ×2 IMPLANT
GLOVE SURG UNDER POLY LF SZ8 (GLOVE) ×2
GOWN STRL REUS W/ TWL LRG LVL3 (GOWN DISPOSABLE) ×1 IMPLANT
GOWN STRL REUS W/ TWL XL LVL3 (GOWN DISPOSABLE) ×1 IMPLANT
GOWN STRL REUS W/TWL LRG LVL3 (GOWN DISPOSABLE) ×2
GOWN STRL REUS W/TWL XL LVL3 (GOWN DISPOSABLE) ×2
KIT BASIN OR (CUSTOM PROCEDURE TRAY) ×2 IMPLANT
KIT TURNOVER KIT B (KITS) ×2 IMPLANT
NDL HYPO 25GX1X1/2 BEV (NEEDLE) ×1 IMPLANT
NEEDLE HYPO 25GX1X1/2 BEV (NEEDLE) ×2 IMPLANT
NS IRRIG 1000ML POUR BTL (IV SOLUTION) ×2 IMPLANT
PACK GENERAL/GYN (CUSTOM PROCEDURE TRAY) ×2 IMPLANT
PAD ARMBOARD 7.5X6 YLW CONV (MISCELLANEOUS) ×4 IMPLANT
PENCIL SMOKE EVACUATOR (MISCELLANEOUS) ×2 IMPLANT
SUT MNCRL AB 4-0 PS2 18 (SUTURE) ×2 IMPLANT
SUT VIC AB 3-0 SH 27 (SUTURE) ×2
SUT VIC AB 3-0 SH 27X BRD (SUTURE) ×1 IMPLANT
SYR CONTROL 10ML LL (SYRINGE) ×2 IMPLANT
TOWEL GREEN STERILE (TOWEL DISPOSABLE) ×2 IMPLANT
TOWEL GREEN STERILE FF (TOWEL DISPOSABLE) ×2 IMPLANT

## 2021-04-05 NOTE — Transfer of Care (Signed)
Immediate Anesthesia Transfer of Care Note  Patient: Shelby [name]  Procedure(s) Performed: PORT REMOVAL  Patient Location: PACU  Anesthesia Type:MAC  Level of Consciousness: awake, alert  and oriented  Airway & Oxygen Therapy: Patient Spontanous Breathing  Post-op Assessment: Report given to RN and Post -op Vital signs reviewed and stable  Post vital signs: Reviewed and stable  Last Vitals:  Vitals Value Taken Time  BP 144/89   Temp 36.5 C 04/05/21 1450  Pulse    Resp 14   SpO2 99%     Last Pain:  Vitals:   04/05/21 1242  TempSrc:   PainSc: 2          Complications: No notable events documented.

## 2021-04-05 NOTE — Interval H&P Note (Signed)
History and Physical Interval Note:  04/05/2021 1:53 PM  Shelby [name]  has presented today for surgery, with the diagnosis of PORT IN PLACE.  The various methods of treatment have been discussed with the patient and family. After consideration of risks, benefits and other options for treatment, the patient has consented to  Procedure(s): PORT REMOVAL (N/A) as a surgical intervention.  The patient's history has been reviewed, patient examined, no change in status, stable for surgery.  I have reviewed the patient's chart and labs.  Questions were answered to the patient's satisfaction.     [place]

## 2021-04-05 NOTE — Anesthesia Preprocedure Evaluation (Addendum)
Anesthesia Evaluation  Patient identified by MRN, date of birth, ID band Patient awake    Reviewed: Allergy & Precautions, NPO status , Patient's Chart, lab work & pertinent test results  Airway Mallampati: II  TM Distance: >3 FB Neck ROM: Full    Dental  (+) Teeth Intact, Dental Advisory Given   Pulmonary neg pulmonary ROS,    breath sounds clear to auscultation       Cardiovascular hypertension,  Rhythm:Regular Rate:Normal     Neuro/Psych PSYCHIATRIC DISORDERS Anxiety Depression negative neurological ROS     GI/Hepatic Neg liver ROS, GERD  Medicated and Controlled,  Endo/Other  diabetes, Type 2, Oral Hypoglycemic Agents  Renal/GU negative Renal ROS     Musculoskeletal negative musculoskeletal ROS (+)   Abdominal (+) + obese,   Peds  Hematology negative hematology ROS (+)   Anesthesia Other Findings   Reproductive/Obstetrics                            Anesthesia Physical Anesthesia Plan  ASA: 4  Anesthesia Plan: MAC   Post-op Pain Management:    Induction: Intravenous  PONV Risk Score and Plan: 3 and Ondansetron, Dexamethasone and Propofol infusion  Airway Management Planned: Natural Airway and Simple Face Mask  Additional Equipment: None  Intra-op Plan:   Post-operative Plan:   Informed Consent: I have reviewed the patients History and Physical, chart, labs and discussed the procedure including the risks, benefits and alternatives for the proposed anesthesia with the patient or authorized representative who has indicated his/her understanding and acceptance.     Dental advisory given  Plan Discussed with: CRNA  Anesthesia Plan Comments:        Anesthesia Quick Evaluation

## 2021-04-05 NOTE — Progress Notes (Signed)
Feels better she states and is asking if she may go home. States her chest discomfort is better but she just feels warm.

## 2021-04-05 NOTE — Telephone Encounter (Signed)
Noticed patient was currently in recovery after having her port removed. Called and left a VM on her phone to see if she would prefer to do her 1 month F/U over the phone tomorrow, or reschedule to another day when Dr. Isidore Moos will be in clinic. Provided direct call back number for patient to update me later when she's had time to recover from procedure.

## 2021-04-05 NOTE — Progress Notes (Signed)
Dr Smith Robert at bedside after being notified pt c/o ant /central chest pain  that is worse with inspiration / no ectopy on monitor & no associated sxs / reassurance offered by MD and RN. Will monitor

## 2021-04-05 NOTE — H&P (Signed)
History of Present Illness: Shelby [name] is a 50 y.o. female who is seen today for patient returns for follow-up of her right breast abscess. She is 2 months out from right breast lumpectomy with sentinel lymph node mapping for stage II right breast cancer. She is done with chemotherapy and needs to have her port removed. Her infection in her right breast has resolved..  Review of Systems: A complete review of systems was obtained from the patient. I have reviewed this information and discussed as appropriate with the patient. See HPI as well for other ROS.  Review of Systems  Constitutional: Negative.  HENT: Negative.  Eyes: Negative.  Respiratory: Negative.  Cardiovascular: Negative.  Skin: Negative.    Medical History: Past Medical History:  Diagnosis Date   History of cancer   There is no problem list on file for this patient.  Past Surgical History:  Procedure Laterality Date   breast surgery N/A   gallbladder surgery N/A   hernia surgery N/A    Allergies  Allergen Reactions   Codeine Palpitations and Swelling  HBP   Current Outpatient Medications on File Prior to Visit  Medication Sig Dispense Refill   citalopram (CELEXA) 20 MG tablet Take by mouth   dexAMETHasone (DECADRON) 4 MG tablet Take by mouth   fluticasone propionate (FLONASE) 50 mcg/actuation nasal spray Place into one nostril   prochlorperazine (COMPAZINE) 10 MG tablet Take by mouth   tolterodine (DETROL LA) 2 MG LA capsule Take by mouth   cetirizine (ZYRTEC) 10 MG tablet Take 10 mg by mouth once daily   doxycycline (VIBRA-TABS) 100 MG tablet Take 1 (one) Tablet by mouth two times daily   ibuprofen (MOTRIN) 800 MG tablet Take 1 tablet (800 mg total) by mouth every 8 (eight) hours as needed.   lidocaine-prilocaine (EMLA) cream APPLY TO AFFECTED AREA TOPICALLY ONCE   loratadine (CLARITIN) 10 mg tablet Take 10 mg by mouth once daily   metFORMIN (GLUCOPHAGE) 500 MG tablet TAKE ONE TABLET BY MOUTH TWO TIMES  DAILY WITH A MEAL   mupirocin (BACTROBAN) 2 % ointment Apply 1 Application topically 2 (two) times daily   NYSTOP 100,000 unit/gram powder Apply 1 Application topically 2 (two) times daily   ondansetron (ZOFRAN) 8 MG tablet Take 1 tablet (8 mg total) by mouth 2 (two) times daily as needed for refractory nausea / vomiting. Start on day 3 after chemo.   pantoprazole (PROTONIX) 40 MG DR tablet Take 40 mg by mouth once daily   traMADoL (ULTRAM) 50 mg tablet TAKE ONE TABLET BY MOUTH EVERY SIX HOURS AS NEEDED   No current facility-administered medications on file prior to visit.   History reviewed. No pertinent family history.   Social History   Tobacco Use  Smoking Status Never Smoker  Smokeless Tobacco Never Used    Social History   Socioeconomic History   Marital status: Married  Tobacco Use   Smoking status: Never Smoker   Smokeless tobacco: Never Used  Scientific laboratory technician Use: Never used  Substance and Sexual Activity   Alcohol use: Never   Drug use: Never   Objective:   There were no vitals filed for this visit.  There is no height or weight on file to calculate BMI.  Physical Exam   General appearance - alert, well appearing, and in no distress Neck - supple, no significant adenopathy Breasts - breasts appear normal, no suspicious masses, no skin or nipple changes or axillary nodes, Right breast incision closed.  No further signs of infection redness or drainage down the right. Right axillar incision clean dry intact port noted right subclavian region Extremities - pedal edema 1 +  Labs, Imaging and Diagnostic Testing:  none  Assessment and Plan:    Postoperative state-she is doing chemo and report will be removed. Discussed risk of bleeding, infection, catheter migration, catheter fragmentation, cardiovascular risk, pulmonary risk, and the risk of her elevated BMI  She agrees to proceed  Return in about 3 months (around 04/10/2021).  Kennieth Francois, MD    I had direct face-to-face contact with the patient for a total of 20 minutes and greater than 50% of that time was spent providing counseling and/or coordination of care for the patient regardingport removal/ wound care. Electronically signed by Kennieth Francois, MD at 01/08/2021 11:35 AM EDT

## 2021-04-05 NOTE — Op Note (Signed)
Preop diagnosis: Indwelling port a catheter for chemotherapy  Postop diagnosis: Same  Procedure: Removal of port a catheter  Surgeon: Erroll Luna M.D.   Assistant: Dr Fonnie Mu MD    I was personally present during the key and critical portions of this procedure and immediately available throughout the entire procedure, as documented in my operative note.   Anesthesia: MAC with local  EBL: Minimal  Specimen none  Drains: None  Indications for procedure: The patient presents for removal of port a catheter after completing chemotherapy. The patient no longer requires central venous access. Risks of bleeding, infection, catheter fragmentation, embolization, arrhythmias and damage to arteries, veins and nerves and possibly other mediastinal structures discussed. The patient agrees to proceed.  Description of procedure: The patient was seen in the holding area. Questions were answered. The patient agreed to proceed. The patient was taken to the operating room. The patient was placed supine. Anesthesia was initiated. The skin on the upper chest was prepped and draped in a sterile fashion. Timeout was done. The patient received preoperative antibiotics. Incision was made through the old port site and the hub of the Port-A-Cath was seen. The sutures were cut to release the port from the chest wall. The catheter was removed in its entirety without difficulty. The tract was closed with 3-0 Vicryl. 4 Monocryl was used to close the skin. All final counts were correct. The patient was taken to recovery in satisfactory condition.

## 2021-04-05 NOTE — Anesthesia Postprocedure Evaluation (Signed)
Anesthesia Post Note  Patient: Shelby [name]  Procedure(s) Performed: PORT REMOVAL     Patient location during evaluation: PACU Anesthesia Type: MAC Level of consciousness: awake and alert Pain management: pain level controlled Vital Signs Assessment: post-procedure vital signs reviewed and stable Respiratory status: spontaneous breathing, nonlabored ventilation, respiratory function stable and patient connected to nasal cannula oxygen Cardiovascular status: stable and blood pressure returned to baseline Postop Assessment: no apparent nausea or vomiting Anesthetic complications: no   No notable events documented.  Last Vitals:  Vitals:   04/05/21 1520 04/05/21 1535  BP: (!) 143/79 (!) 149/63  Pulse: 70 74  Resp:  18  Temp:  36.4 C  SpO2:  95%    Last Pain:  Vitals:   04/05/21 1535  TempSrc:   PainSc: Asleep                 Effie Berkshire

## 2021-04-05 NOTE — Discharge Instructions (Signed)
#######################################################  GENERAL SURGERY: POST OP INSTRUCTIONS  ######################################################################  EAT Gradually transition to a high fiber diet with a fiber supplement over the next few weeks after discharge.  Start with a pureed / full liquid diet (see below)  WALK Walk an hour a day.  Control your pain to do that.    CONTROL PAIN Control pain so that you can walk, sleep, tolerate sneezing/coughing, go up/down stairs.  HAVE A BOWEL MOVEMENT DAILY Keep your bowels regular to avoid problems.  OK to try a laxative to override constipation.  OK to use an antidairrheal to slow down diarrhea.  Call if not better after 2 tries  CALL IF YOU HAVE PROBLEMS/CONCERNS Call if you are still struggling despite following these instructions. Call if you have concerns not answered by these instructions  ######################################################################    DIET: Follow a light bland diet & liquids the first 24 hours after arrival home, such as soup, liquids, starches, etc.  Be sure to drink plenty of fluids.  Quickly advance to a usual solid diet within a few days.  Avoid fast food or heavy meals as your are more likely to get nauseated or have irregular bowels.  A low-fat, high-fiber diet for the rest of your life is ideal.    Take your usually prescribed home medications unless otherwise directed.  PAIN CONTROL: Pain is best controlled by a usual combination of three different methods TOGETHER: Ice/Heat Over the counter pain medication Prescription pain medication Most patients will experience some swelling and bruising around the incisions.  Ice packs or heating pads (30-60 minutes up to 6 times a day) will help. Use ice for the first few days to help decrease swelling and bruising, then switch to heat to help relax tight/sore spots and speed recovery.  Some people prefer to use ice alone, heat alone,  alternating between ice & heat.  Experiment to what works for you.  Swelling and bruising can take several weeks to resolve.   It is helpful to take an over-the-counter pain medication regularly for the first few weeks.  Choose one of the following that works best for you: Naproxen (Aleve, etc)  Two 220mg tabs twice a day Ibuprofen (Advil, etc) Three 200mg tabs four times a day (every meal & bedtime) Acetaminophen (Tylenol, etc) 500-650mg four times a day (every meal & bedtime) A  prescription for pain medication (such as oxycodone, hydrocodone, etc) should be given to you upon discharge.  Take your pain medication as prescribed.  If you are having problems/concerns with the prescription medicine (does not control pain, nausea, vomiting, rash, itching, etc), please call us [phone] to see if we need to switch you to a different pain medicine that will work better for you and/or control your side effect better. If you need a refill on your pain medication, please contact your pharmacy.  They will contact our office to request authorization. Prescriptions will not be filled after 5 pm or on week-ends.  Avoid getting constipated.  Between the surgery and the pain medications, it is common to experience some constipation.  Increasing fluid intake and taking a fiber supplement (such as Metamucil, Citrucel, FiberCon, MiraLax, etc) 1-2 times a day regularly will usually help prevent this problem from occurring.  A mild laxative (prune juice, Milk of Magnesia, MiraLax, etc) should be taken according to package directions if there are no bowel movements after 48 hours.   Watch out for diarrhea.  If you have many loose bowel movements, simplify your   diet to bland foods & liquids for a few days.  Stop any stool softeners and decrease your fiber supplement.  Switching to mild anti-diarrheal medications (Loperamide/Imodium, Kayopectate, Pepto Bismol) can help.  If this worsens or does not improve, please call  us.  Wash / shower every day.  You may shower over the dressings as they are waterproof.  Continue to shower over incision(s) after the dressing is off. Remove your waterproof bandages 5 days after surgery.  You may leave the incision open to air.  You may have skin tapes (Steri Strips) covering the incision(s).  Leave them on until one week, then remove.  You may replace a dressing/Band-Aid to cover the incision for comfort if you wish.   ACTIVITIES as tolerated:   You may resume regular (light) daily activities beginning the next day--such as daily self-care, walking, climbing stairs--gradually increasing activities as tolerated.  If you can walk 30 minutes without difficulty, it is safe to try more intense activity such as jogging, treadmill, bicycling, low-impact aerobics, swimming, etc. Save the most intensive and strenuous activity for last such as sit-ups, heavy lifting, contact sports, etc  Refrain from any heavy lifting or straining until you are off narcotics for pain control.   DO NOT PUSH THROUGH PAIN.  Let pain be your guide: If it hurts to do something, don't do it.  Pain is your body warning you to avoid that activity for another week until the pain goes down. You may drive when you are no longer taking prescription pain medication, you can comfortably wear a seatbelt, and you can safely maneuver your car and apply brakes. You may have sexual intercourse when it is comfortable.   FOLLOW UP in our office Please call [facility] at [phone] to set up an appointment to see your surgeon in the office for a follow-up appointment approximately 2-3 weeks after your surgery. Make sure that you call for this appointment the day you arrive home to insure a convenient appointment time.  9. IF YOU HAVE DISABILITY OR FAMILY LEAVE FORMS, BRING THEM TO THE OFFICE FOR PROCESSING.  DO NOT GIVE THEM TO YOUR DOCTOR.   WHEN TO CALL US [phone]: Poor pain control Reactions / problems with new  medications (rash/itching, nausea, etc)  Fever over 101.5 F (38.5 C) Worsening swelling or bruising Continued bleeding from incision. Increased pain, redness, or drainage from the incision Difficulty breathing / swallowing   The clinic staff is available to answer your questions during regular business hours (8:30am-5pm).  Please don't hesitate to call and ask to speak to one of our nurses for clinical concerns.   If you have a medical emergency, go to the nearest emergency room or call 911.  A surgeon from [facility] is always on call at the hospitals   [facility] [address], [address], [place], [place]  [address] ? MAIN: [phone] ? TOLL FREE: [phone] ?  FAX [phone] [email or web address]  #######################################################  

## 2021-04-05 NOTE — Progress Notes (Signed)
Dr Smith Robert notified ekg completed/ still c/o mid chest discomfort, whose with deep breaths

## 2021-04-06 ENCOUNTER — Ambulatory Visit: Payer: BC Managed Care – PPO | Admitting: Radiation Oncology

## 2021-04-06 ENCOUNTER — Encounter (HOSPITAL_COMMUNITY): Payer: Self-pay | Admitting: Surgery

## 2021-05-01 ENCOUNTER — Encounter: Payer: Self-pay | Admitting: Hematology and Oncology

## 2021-05-01 NOTE — Progress Notes (Signed)
Patient Name: Shelby [name] MRN: [id number] DOB: [birth year] Referring Physician: Erroll Luna (Profile Not Attached) Date of Service: 03/07/2021 [facility], [place] [facility]                                                        End Of Treatment Note  Diagnoses: C50.111-Malignant neoplasm of central portion of right female breast  Cancer Staging: Cancer Staging Malignant neoplasm of central portion of right breast in female, estrogen receptor positive ([place]) Staging form: Breast, AJCC 8th Edition - Clinical stage from 07/17/2020: Stage IIA (cT2, cN1, cM0, G2, ER+, PR+, HER2-) - Signed by Nicholas Lose, MD on 07/17/2020 Stage prefix: Initial diagnosis - Pathologic stage from 08/09/2020: Stage IIIA (pT2, pN3a, cM0, G2, ER+, PR+, HER2-) - Signed by Gardenia Phlegm, NP on 08/23/2020 Stage prefix: Initial diagnosis Histologic grading system: 3 grade system   Intent: Curative  Radiation Treatment Dates: 01/23/2021 through 03/07/2021 Site Technique Total Dose (Gy) Dose per Fx (Gy) Completed Fx Beam Energies  Breast, Right: Breast_Rt_IMN 3D 50/50 2 25/25 15X  Breast, Right: Breast_Rt_PAB_SCV 3D 50/50 2 25/25 15X  Breast, Right: Breast_Rt_Bst 3D 10/10 2 5/5 6X, 15X   Narrative: The patient tolerated radiation therapy relatively well.   Plan: The patient will follow-up with radiation oncology in [place] as needed. -----------------------------------  SEppie Gibson MD

## 2021-05-04 ENCOUNTER — Other Ambulatory Visit: Payer: Self-pay | Admitting: Nurse Practitioner

## 2021-05-04 DIAGNOSIS — I10 Essential (primary) hypertension: Secondary | ICD-10-CM

## 2021-05-04 DIAGNOSIS — R002 Palpitations: Secondary | ICD-10-CM

## 2021-05-04 DIAGNOSIS — J301 Allergic rhinitis due to pollen: Secondary | ICD-10-CM

## 2021-05-04 NOTE — Telephone Encounter (Signed)
Je NTBS 30 days given 04/04/21

## 2021-05-04 NOTE — Telephone Encounter (Signed)
Scheduled appointment for 05/08/21

## 2021-05-08 ENCOUNTER — Encounter: Payer: Self-pay | Admitting: Nurse Practitioner

## 2021-05-08 ENCOUNTER — Telehealth (INDEPENDENT_AMBULATORY_CARE_PROVIDER_SITE_OTHER): Payer: BC Managed Care – PPO | Admitting: Nurse Practitioner

## 2021-05-08 DIAGNOSIS — R002 Palpitations: Secondary | ICD-10-CM | POA: Diagnosis not present

## 2021-05-08 DIAGNOSIS — K219 Gastro-esophageal reflux disease without esophagitis: Secondary | ICD-10-CM

## 2021-05-08 DIAGNOSIS — J301 Allergic rhinitis due to pollen: Secondary | ICD-10-CM | POA: Diagnosis not present

## 2021-05-08 DIAGNOSIS — R7303 Prediabetes: Secondary | ICD-10-CM

## 2021-05-08 DIAGNOSIS — I10 Essential (primary) hypertension: Secondary | ICD-10-CM

## 2021-05-08 MED ORDER — PANTOPRAZOLE SODIUM 40 MG PO TBEC: 40.0000 mg | DELAYED_RELEASE_TABLET | Freq: Every day | ORAL | 1 refills | Status: DC

## 2021-05-08 MED ORDER — MUPIROCIN CALCIUM 2 % EX CREA: 1.0000 "application " | TOPICAL_CREAM | Freq: Two times a day (BID) | CUTANEOUS | 2 refills | Status: DC

## 2021-05-08 MED ORDER — METOPROLOL TARTRATE 50 MG PO TABS: 50.0000 mg | ORAL_TABLET | Freq: Every day | ORAL | 1 refills | Status: DC

## 2021-05-08 MED ORDER — LORATADINE 10 MG PO TABS: 10.0000 mg | ORAL_TABLET | Freq: Every day | ORAL | 11 refills | Status: DC

## 2021-05-08 MED ORDER — CETIRIZINE HCL 10 MG PO TABS: 10.0000 mg | ORAL_TABLET | Freq: Every day | ORAL | 2 refills | Status: DC

## 2021-05-08 MED ORDER — TRIPLE ANTIBIOTIC 5-400-5000 EX OINT: 1.0000 "application " | TOPICAL_OINTMENT | Freq: Two times a day (BID) | CUTANEOUS | 1 refills | Status: DC

## 2021-05-08 MED ORDER — METFORMIN HCL 500 MG PO TABS: 500.0000 mg | ORAL_TABLET | Freq: Two times a day (BID) | ORAL | 3 refills | Status: DC

## 2021-05-08 NOTE — Assessment & Plan Note (Signed)
Hypertension controlled on Metroprolol 50 mg tablet by mouth patient is currently taking 50 mg tablet by mouth daily.  Continue low-sodium diet exercise regimen as tolerated.  Rx refill sent to pharmacy.  Follow-up in 3 months.

## 2021-05-08 NOTE — Progress Notes (Signed)
Virtual Visit via Video Note   This visit type was conducted due to national recommendations for restrictions regarding the COVID-19 Pandemic (e.g. social distancing) in an effort to limit this patient's exposure and mitigate transmission in our community.  Due to her co-morbid illnesses, this patient is at least at moderate risk for complications without adequate follow up.  This format is felt to be most appropriate for this patient at this time.  All issues noted in this document were discussed and addressed.  A limited physical exam was performed with this format.  A verbal consent was obtained for the virtual visit.   Date:  05/08/2021   ID:  Shelby [name], DOB 02-11-1971, MRN [id number]  Patient Location: Home Provider Location: Office/Clinic  PCP:  [name], NP   Evaluation Performed:  Follow-Up Visit  Chief Complaint:  chronic disease management   History of Present Illness:    Shelby [name] is a 50 y.o. female with Pt presents for follow up of hypertension. Patient was diagnosed in 03/19/2016. The patient is tolerating the medication well without side effects. Compliance with treatment has been good; including taking medication as directed , maintains a healthy diet and regular exercise regimen , and following up as directed.   Gastroesophageal Reflux She reports no abdominal pain, no belching, no choking, no coughing, no heartburn or no hoarse voice. This is a chronic problem. The current episode started more than 1 year ago. The problem occurs rarely. The problem has been gradually improving. Nothing aggravates the symptoms. Pertinent negatives include no anemia or fatigue. Risk factors include obesity. The treatment provided significant relief.    The patient does not have symptoms concerning for COVID-19 infection (fever, chills, cough, or new shortness of breath).    Past Medical History:  Diagnosis Date   Cancer ([place])    right breast   Complication of anesthesia     hard to wake up  per pt   Dyspnea    GERD (gastroesophageal reflux disease)    Heart palpitations    Obesity    Pre-diabetes     Past Surgical History:  Procedure Laterality Date   BREAST LUMPECTOMY WITH RADIOACTIVE SEED AND SENTINEL LYMPH NODE BIOPSY Right 08/09/2020   Procedure: RIGHT BREAST LUMPECTOMY WITH RADIOACTIVE SEED AND RIGHT BREAST RADIOACTIVE SEED TARGETED SENTINEL LYMPH NODE BIOPSY, RIGHT BREAST SENTINEL LYMPH NODE MAPPING;  Surgeon: Erroll Luna, MD;  Location: [facility];  Service: General;  Laterality: Right;  PEC BLOCK   BREAST SURGERY     CARDIAC CATHETERIZATION     CHOLECYSTECTOMY     DILATION AND CURETTAGE OF UTERUS     x1   HERNIA REPAIR     IR IMAGING GUIDED PORT INSERTION  08/29/2020   IR IMAGING GUIDED PORT INSERTION  08/29/2020   lumpectomy right breast     PORT-A-CATH REMOVAL N/A 04/05/2021   Procedure: PORT REMOVAL;  Surgeon: Erroll Luna, MD;  Location: [facility];  Service: General;  Laterality: N/A;    Family History  Adopted: Yes    Social History   Socioeconomic History   Marital status: Married    Spouse name: Not on file   Number of children: Not on file   Years of education: Not on file   Highest education level: Not on file  Occupational History   Not on file  Tobacco Use   Smoking status: Never   Smokeless tobacco: Never  Vaping Use   Vaping Use: Never used  Substance and  Sexual Activity   Alcohol use: No   Drug use: No   Sexual activity: Yes    Partners: Male    Birth control/protection: Pill  Other Topics Concern   Not on file  Social History Narrative   Not on file   Social Determinants of Health   Financial Resource Strain: Not on file  Food Insecurity: Not on file  Transportation Needs: Not on file  Physical Activity: Not on file  Stress: Not on file  Social Connections: Not on file  Intimate Partner Violence: Not on file    Outpatient Medications Prior to Visit  Medication Sig Dispense Refill   ibuprofen  (ADVIL) 800 MG tablet Take 1 tablet (800 mg total) by mouth every 8 (eight) hours as needed. 30 tablet 0   letrozole (FEMARA) 2.5 MG tablet Take 1 tablet (2.5 mg total) by mouth daily. (Patient taking differently: Take 2.5 mg by mouth at bedtime.) 90 tablet 3   nystatin (MYCOSTATIN/NYSTOP) powder Apply 1 application topically 2 (two) times daily. (Patient not taking: No sig reported) 15 g 4   cetirizine (ZYRTEC) 10 MG tablet Take 1 tablet (10 mg total) by mouth daily. 30 tablet 0   loratadine (CLARITIN) 10 MG tablet Take 1 tablet (10 mg total) by mouth daily. 30 tablet 11   metFORMIN (GLUCOPHAGE) 500 MG tablet Take 1 tablet (500 mg total) by mouth 2 (two) times daily with a meal. (NEEDS TO BE SEEN BEFORE NEXT REFILL) 60 tablet 3   metoprolol tartrate (LOPRESSOR) 50 MG tablet Take 1 tablet (50 mg total) by mouth 2 (two) times daily. (Patient taking differently: Take 50 mg by mouth daily.) 60 tablet 6   mupirocin cream (BACTROBAN) 2 % Apply 1 application topically 2 (two) times daily. (Patient not taking: No sig reported) 15 g 2   neomycin-bacitracin-polymyxin (NEOSPORIN) [id number] ointment Apply 1 application topically in the morning and at bedtime.     pantoprazole (PROTONIX) 40 MG tablet Take 1 tablet (40 mg total) by mouth daily. (NEEDS TO BE SEEN BEFORE NEXT REFILL) (Patient taking differently: Take 40 mg by mouth at bedtime. (NEEDS TO BE SEEN BEFORE NEXT REFILL)) 30 tablet 0   No facility-administered medications prior to visit.    Allergies:   Codeine and Peach flavor   Social History   Tobacco Use   Smoking status: Never   Smokeless tobacco: Never  Vaping Use   Vaping Use: Never used  Substance Use Topics   Alcohol use: No   Drug use: No     Review of Systems  Constitutional: Negative.  Negative for fatigue.  HENT: Negative.  Negative for hoarse voice.   Respiratory:  Negative for cough and choking.   Cardiovascular: Negative.   Gastrointestinal:  Negative for abdominal pain  and heartburn.  Skin: Negative.  Negative for rash.  All other systems reviewed and are negative.   Labs/Other Tests and Data Reviewed:    Recent Labs: 12/15/2020: ALT 17 04/05/2021: BUN 12; Creatinine, Ser 0.69; Hemoglobin 14.3; Platelets 224; Potassium 4.4; Sodium 138   Recent Lipid Panel Lab Results  Component Value Date/Time   CHOL 154 07/11/2020 12:32 PM   TRIG 184 (H) 07/11/2020 12:32 PM   HDL 31 (L) 07/11/2020 12:32 PM   CHOLHDL 5.0 (H) 07/11/2020 12:32 PM   LDLCALC 91 07/11/2020 12:32 PM    Wt Readings from Last 3 Encounters:  04/05/21 (!) 394 lb (178.7 kg)  03/02/21 (!) 394 lb 8 oz (178.9 kg)  01/15/21 (!) 403 lb  6 oz (183 kg)     Objective:    Vital Signs:  LMP 05/13/2016    Physical Exam Vitals and nursing note reviewed.  Constitutional:      Appearance: Normal appearance.  HENT:     Head: Normocephalic.  Neurological:     Mental Status: She is alert.    Limited physical assessment due to virtual visit  ASSESSMENT & PLAN:   1. Seasonal allergic rhinitis due to pollen - cetirizine (ZYRTEC) 10 MG tablet; Take 1 tablet (10 mg total) by mouth daily.  Dispense: 30 tablet; Refill: 2 - loratadine (CLARITIN) 10 MG tablet; Take 1 tablet (10 mg total) by mouth daily.  Dispense: 30 tablet; Refill: 11  2. Prediabetes - metFORMIN (GLUCOPHAGE) 500 MG tablet; Take 1 tablet (500 mg total) by mouth 2 (two) times daily with a meal. (NEEDS TO BE SEEN BEFORE NEXT REFILL)  Dispense: 60 tablet; Refill: 3  3. Palpitations - metoprolol tartrate (LOPRESSOR) 50 MG tablet; Take 1 tablet (50 mg total) by mouth daily.  Dispense: 90 tablet; Refill: 1  4. Essential hypertension - metoprolol tartrate (LOPRESSOR) 50 MG tablet; Take 1 tablet (50 mg total) by mouth daily.  Dispense: 90 tablet; Refill: 1  5. Gastroesophageal reflux disease without esophagitis - pantoprazole (PROTONIX) 40 MG tablet; Take 1 tablet (40 mg total) by mouth at bedtime. (NEEDS TO BE SEEN BEFORE NEXT REFILL)   Dispense: 90 tablet; Refill: 1      COVID-19 Education: The signs and symptoms of COVID-19 were discussed with the patient and how to seek care for testing (follow up with PCP or arrange E-visit). The importance of social distancing was discussed today.  Time:   Today, I have spent 15 minutes with the patient with telehealth technology discussing the above problems.    Follow Up:  In Person in 3 month(s)  Signed, [name], NP  05/08/2021 7:24 PM    [place]

## 2021-05-08 NOTE — Assessment & Plan Note (Signed)
Gastroesophageal reflux disease without esophagitis symptoms are well managed with 40 mg Protonix daily.  Rx refill sent to pharmacy.

## 2021-06-01 ENCOUNTER — Encounter: Payer: Self-pay | Admitting: Adult Health

## 2021-06-01 ENCOUNTER — Inpatient Hospital Stay: Payer: BC Managed Care – PPO | Attending: Hematology and Oncology | Admitting: Adult Health

## 2021-06-01 ENCOUNTER — Other Ambulatory Visit: Payer: Self-pay

## 2021-06-01 VITALS — BP 133/72 | HR 73 | Temp 97.5°F | Resp 17 | Wt >= 6400 oz

## 2021-06-01 DIAGNOSIS — C50111 Malignant neoplasm of central portion of right female breast: Secondary | ICD-10-CM

## 2021-06-01 DIAGNOSIS — G629 Polyneuropathy, unspecified: Secondary | ICD-10-CM | POA: Insufficient documentation

## 2021-06-01 DIAGNOSIS — Z170 Estrogen receptor positive status [ER+]: Secondary | ICD-10-CM

## 2021-06-01 DIAGNOSIS — C773 Secondary and unspecified malignant neoplasm of axilla and upper limb lymph nodes: Secondary | ICD-10-CM | POA: Diagnosis not present

## 2021-06-01 DIAGNOSIS — E2839 Other primary ovarian failure: Secondary | ICD-10-CM | POA: Diagnosis not present

## 2021-06-01 DIAGNOSIS — R232 Flushing: Secondary | ICD-10-CM | POA: Diagnosis not present

## 2021-06-01 MED ORDER — GABAPENTIN 100 MG PO CAPS: 100.0000 mg | ORAL_CAPSULE | Freq: Every day | ORAL | 0 refills | Status: DC

## 2021-06-01 NOTE — Progress Notes (Signed)
SURVIVORSHIP VISIT:    BRIEF ONCOLOGIC HISTORY:  Oncology History  Malignant neoplasm of central portion of right breast in female, estrogen receptor positive ([place])  07/04/2020 Initial Diagnosis   Screening mammogram detected right breast mass and right axillary lymph nodes.  Right breast nipple retraction and retroareolar thickening.  2.4 cm mass with 2 abnormal lymph nodes.  Biopsy revealed invasive lobular cancer grade 1-2 ER 95%, PR 95%, Ki-67 30%, HER2 equivocal by IHC negative by FISH ratio 1.39   07/17/2020 Cancer Staging   Staging form: Breast, AJCC 8th Edition - Clinical stage from 07/17/2020: Stage IIA (cT2, cN1, cM0, G2, ER+, PR+, HER2-) - Signed by Nicholas Lose, MD on 07/17/2020    08/09/2020 Surgery   Rght lumpectomy ([facility]): invasive lobular carcinoma, 3.8cm, clear margins, 19/19 right axillary lymph nodes positive for metastatic carcinoma.    08/09/2020 Cancer Staging   Staging form: Breast, AJCC 8th Edition - Pathologic stage from 08/09/2020: Stage IIIA (pT2, pN3a, cM0, G2, ER+, PR+, HER2-) - Signed by Gardenia Phlegm, NP on 08/23/2020 Stage prefix: Initial diagnosis Histologic grading system: 3 grade system    09/15/2020 - 11/24/2020 Chemotherapy   Adjuvant chemotherapy with TC Q21D x4       01/25/2021 - 03/07/2021 Radiation Therapy   Adjuvant radiation   03/2021 -  Anti-estrogen oral therapy   Letrozole daily     INTERVAL HISTORY:  Shelby [name] to review her survivorship care plan detailing her treatment course for breast cancer, as well as monitoring long-term side effects of that treatment, education regarding health maintenance, screening, and overall wellness and health promotion.     Shelby is doing moderately well today.  She is taking letrozole.  She continues to struggle with neuropathy from her chemotherapy.  She is having hot flashes.  The neuropathy she has in her feet does keep her up at night.  She has occasional issues with balance.  She is concerned  about her weight, because she has lost down to 394 and now she is 4 and 6.  This is very discouraging to her because originally she was 420 pounds.  REVIEW OF SYSTEMS:  Review of Systems  Constitutional:  Positive for fatigue. Negative for appetite change, chills, fever and unexpected weight change.  HENT:   Negative for hearing loss, lump/mass and trouble swallowing.   Eyes:  Negative for eye problems and icterus.  Respiratory:  Negative for chest tightness, cough and shortness of breath.   Cardiovascular:  Negative for chest pain, leg swelling and palpitations.  Gastrointestinal:  Negative for abdominal distention, abdominal pain, constipation, diarrhea, nausea and vomiting.  Endocrine: Positive for hot flashes.  Genitourinary:  Negative for difficulty urinating.   Musculoskeletal:  Negative for arthralgias.  Skin:  Negative for itching and rash.  Neurological:  Positive for numbness. Negative for dizziness, extremity weakness and headaches.  Hematological:  Negative for adenopathy. Does not bruise/bleed easily.  Psychiatric/Behavioral:  Negative for depression. The patient is not nervous/anxious.   Breast: Denies any new nodularity, masses, tenderness, nipple changes, or nipple discharge.    ONCOLOGY TREATMENT TEAM:  1. Surgeon:  Dr. Brantley Stage at [facility] 2. Medical Oncologist: Dr. Lindi Adie  3. Radiation Oncologist: Dr. Isidore Moos    PAST MEDICAL/SURGICAL HISTORY:  Past Medical History:  Diagnosis Date   Cancer [facility])    right breast   Complication of anesthesia    hard to wake up  per pt   Dyspnea    GERD (gastroesophageal reflux disease)    Heart  palpitations    Obesity    Pre-diabetes    Past Surgical History:  Procedure Laterality Date   BREAST LUMPECTOMY WITH RADIOACTIVE SEED AND SENTINEL LYMPH NODE BIOPSY Right 08/09/2020   Procedure: RIGHT BREAST LUMPECTOMY WITH RADIOACTIVE SEED AND RIGHT BREAST RADIOACTIVE SEED TARGETED SENTINEL LYMPH NODE BIOPSY, RIGHT  BREAST SENTINEL LYMPH NODE MAPPING;  Surgeon: Erroll Luna, MD;  Location: [place];  Service: General;  Laterality: Right;  PEC BLOCK   BREAST SURGERY     CARDIAC CATHETERIZATION     CHOLECYSTECTOMY     DILATION AND CURETTAGE OF UTERUS     x1   HERNIA REPAIR     IR IMAGING GUIDED PORT INSERTION  08/29/2020   IR IMAGING GUIDED PORT INSERTION  08/29/2020   lumpectomy right breast     PORT-A-CATH REMOVAL N/A 04/05/2021   Procedure: PORT REMOVAL;  Surgeon: Erroll Luna, MD;  Location: [place];  Service: General;  Laterality: N/A;     ALLERGIES:  Allergies  Allergen Reactions   Codeine Swelling   Peach Flavor Other (See Comments)    Severe Migraines     CURRENT MEDICATIONS:  Outpatient Encounter Medications as of 06/01/2021  Medication Sig   cetirizine (ZYRTEC) 10 MG tablet Take 1 tablet (10 mg total) by mouth daily.   letrozole (FEMARA) 2.5 MG tablet Take 1 tablet (2.5 mg total) by mouth daily. (Patient taking differently: Take 2.5 mg by mouth at bedtime.)   loratadine (CLARITIN) 10 MG tablet Take 1 tablet (10 mg total) by mouth daily. (Patient taking differently: Take 10 mg by mouth 2 (two) times daily. Takes 1 in the morning and 1 in the the evening)   metFORMIN (GLUCOPHAGE) 500 MG tablet Take 1 tablet (500 mg total) by mouth 2 (two) times daily with a meal. (NEEDS TO BE SEEN BEFORE NEXT REFILL)   metoprolol tartrate (LOPRESSOR) 50 MG tablet Take 1 tablet (50 mg total) by mouth daily.   pantoprazole (PROTONIX) 40 MG tablet Take 1 tablet (40 mg total) by mouth at bedtime. (NEEDS TO BE SEEN BEFORE NEXT REFILL)   ibuprofen (ADVIL) 800 MG tablet Take 1 tablet (800 mg total) by mouth every 8 (eight) hours as needed. (Patient not taking: Reported on 06/01/2021)   mupirocin cream (BACTROBAN) 2 % Apply 1 application topically 2 (two) times daily. (Patient not taking: Reported on 06/01/2021)   neomycin-bacitracin-polymyxin (NEOSPORIN) [phone] ointment Apply 1 application topically in the  morning and at bedtime. (Patient not taking: Reported on 06/01/2021)   nystatin (MYCOSTATIN/NYSTOP) powder Apply 1 application topically 2 (two) times daily. (Patient not taking: Reported on 03/29/2021)   No facility-administered encounter medications on file as of 06/01/2021.     ONCOLOGIC FAMILY HISTORY:  Family History  Adopted: Yes     GENETIC COUNSELING/TESTING: Not at this time  SOCIAL HISTORY:  Social History   Socioeconomic History   Marital status: Married    Spouse name: Not on file   Number of children: Not on file   Years of education: Not on file   Highest education level: Not on file  Occupational History   Not on file  Tobacco Use   Smoking status: Never   Smokeless tobacco: Never  Vaping Use   Vaping Use: Never used  Substance and Sexual Activity   Alcohol use: No   Drug use: No   Sexual activity: Yes    Partners: Male    Birth control/protection: Pill  Other Topics Concern   Not on file  Social History  Narrative   Not on file   Social Determinants of Health   Financial Resource Strain: Not on file  Food Insecurity: Not on file  Transportation Needs: Not on file  Physical Activity: Not on file  Stress: Not on file  Social Connections: Not on file  Intimate Partner Violence: Not on file     OBSERVATIONS/OBJECTIVE:  BP 133/72 (BP Location: Left Wrist, Patient Position: Sitting)   Pulse 73   Temp (!) 97.5 F (36.4 C) (Temporal)   Resp 17   Wt (!) 406 lb 1.6 oz (184.2 kg)   LMP 05/13/2016   SpO2 100%   BMI 67.58 kg/m  GENERAL: Patient is a well appearing female in no acute distress HEENT:  Sclerae anicteric.  Oropharynx clear and moist. No ulcerations or evidence of oropharyngeal candidiasis. Neck is supple.  NODES:  No cervical, supraclavicular, or axillary lymphadenopathy palpated.  BREAST EXAM: Deferred LUNGS:  Clear to auscultation bilaterally.  No wheezes or rhonchi. HEART:  Regular rate and rhythm. No murmur appreciated. ABDOMEN:   Soft, nontender.  Positive, normoactive bowel sounds. No organomegaly palpated. MSK:  No focal spinal tenderness to palpation. Full range of motion bilaterally in the upper extremities. EXTREMITIES:  No peripheral edema.   SKIN:  Clear with no obvious rashes or skin changes. No nail dyscrasia. NEURO:  Nonfocal. Well oriented.  Appropriate affect.   LABORATORY DATA:  None for this visit.  DIAGNOSTIC IMAGING:  None for this visit.      ASSESSMENT AND PLAN:  Shelby [name] is a pleasant 50 y.o. female with Stage IIIA right breast invasive lobular carcinoma, ER+/PR+/HER2-, diagnosed in 07/2020, treated with lumpectomy, adjuvant chemotherapy, adjuvant radiation therapy, and anti-estrogen therapy with Letrozole beginning in 03/2021.  She presents to the Survivorship Clinic for our initial meeting and routine follow-up post-completion of treatment for breast cancer.    1. Stage IIIA right breast cancer:  Shelby [name] is continuing to recover from definitive treatment for breast cancer.  Considering her risk for recurrence, and the fact that she had 19 positive lymph nodes, she is eligible to start treatment with Verzenio.  We will give her a little bit of time to adjust to the gabapentin and await for some of the side effects from letrozole to decrease.  I have set her up with our clinical pharmacy practitioner John in a few weeks to discuss the [place].  And her scans from March she had some right axillary adenopathy.  She is due for repeat mammogram in January, at that time we will get a right breast and axillary ultrasound.  She will follow-up with Dr. Payton Mccallum after her mammogram and ultrasound to talk about the Verzenio, and started.  Today, a comprehensive survivorship care plan and treatment summary was reviewed with the patient today detailing her breast cancer diagnosis, treatment course, potential late/long-term effects of treatment, appropriate follow-up care with recommendations for the future, and  patient education resources.  A copy of this summary, along with a letter will be sent to the patient's primary care provider via mail/fax/In Basket message after today's visit.    2.  Peripheral neuropathy and hot flashes.  We discussed this in detail.  I have written for her to start taking gabapentin.  She will start this at 100 mg nightly and increase by 100 mg to 300 mg by mouth every night as tolerated.  She will do this for a few weeks and let us know how it goes.  This may also help with her  sleeping as well.  3. Bone health: We reviewed bone health today.  She has not undergone bone density testing and we will get this ordered.    4. Cancer screening:  Due to Shelby [name]'s history and her age, she should receive screening for skin cancers, colon cancer, and gynecologic cancers.  The information and recommendations are listed on the patient's comprehensive care plan/treatment summary and were reviewed in detail with the patient.    5. Health maintenance and wellness promotion: Shelby [name] was encouraged to consume 5-7 servings of fruits and vegetables per day. We reviewed the "Nutrition Rainbow" handout, as well as the handout "Take Control of Your Health and Reduce Your Cancer Risk" from the [facility].  She was also encouraged to engage in moderate to vigorous exercise for 30 minutes per day most days of the week. We discussed the [employer] fitness program, which is designed for cancer survivors to help them become more physically fit after cancer treatments.  She was instructed to limit her alcohol consumption and continue to abstain from tobacco use.     6. Support services/counseling: It is not uncommon for this period of the patient's cancer care trajectory to be one of many emotions and stressors.  We discussed how this can be increasingly difficult during the times of quarantine and social distancing due to the COVID-19 pandemic.   She was given information regarding our  available services and encouraged to contact me with any questions or for help enrolling in any of our support group/programs.    Follow up instructions:    -Return to cancer center for f/u with Gilford Rile CPP in 2 weeks -Follow-up with Dr. Lindi Adie in 07/2020  -Mammogram due in 07/2020 -Follow up with surgery in 3 months -She is welcome to return back to the Survivorship Clinic at any time; no additional follow-up needed at this time.  -Consider referral back to survivorship as a long-term survivor for continued surveillance  The patient was provided an opportunity to ask questions and all were answered. The patient agreed with the plan and demonstrated an understanding of the instructions.   Total encounter time: 60 minutes in face-to-face visit time, chart review, lab review, order entry, care coordination, discussion with Dr. Payton Mccallum, and documentation of the encounter.  Wilber Bihari, NP 06/01/21 8:45 AM Medical Oncology and Hematology [facility] [place], [place] [address] Tel. [phone]    Fax. [phone]  *Total Encounter Time as defined by the Centers for Medicare and Medicaid Services includes, in addition to the face-to-face time of a patient visit (documented in the note above) non-face-to-face time: obtaining and reviewing outside history, ordering and reviewing medications, tests or procedures, care coordination (communications with other health care professionals or caregivers) and documentation in the medical record.

## 2021-06-07 ENCOUNTER — Telehealth: Payer: Self-pay

## 2021-06-07 NOTE — Telephone Encounter (Signed)
Returned call to pt, pt expressed anxiety over waiting for her upcoming MM and wanted to know if she could increase her gabapentin to 2 capsules.  Per the instructions for the rx pt can taper dose up to 300mg  therefore I instructed pt to try 2 capsules and report back on Monday.  Pt verbalized understanding. I also called [facility] and left message to try to get pt in sooner for her MM. Pt expressed thanks

## 2021-06-08 ENCOUNTER — Encounter: Payer: Self-pay | Admitting: Nurse Practitioner

## 2021-06-08 ENCOUNTER — Ambulatory Visit (INDEPENDENT_AMBULATORY_CARE_PROVIDER_SITE_OTHER): Payer: BC Managed Care – PPO | Admitting: Nurse Practitioner

## 2021-06-08 DIAGNOSIS — N3001 Acute cystitis with hematuria: Secondary | ICD-10-CM

## 2021-06-08 MED ORDER — NITROFURANTOIN MONOHYD MACRO 100 MG PO CAPS: 100.0000 mg | ORAL_CAPSULE | Freq: Two times a day (BID) | ORAL | 0 refills | Status: DC

## 2021-06-08 NOTE — Progress Notes (Signed)
   Virtual Visit  Note Due to COVID-19 pandemic this visit was conducted virtually. This visit type was conducted due to national recommendations for restrictions regarding the COVID-19 Pandemic (e.g. social distancing, sheltering in place) in an effort to limit this patient's exposure and mitigate transmission in our community. All issues noted in this document were discussed and addressed.  A physical exam was not performed with this format.  I connected with Shelby [name] on 06/08/21 at 3:18 by telephone and verified that I am speaking with the correct person using two identifiers. Shelby [name] is currently located at out of town and no one is currently with her during visit. The provider, Mary-Margaret Hassell Done, FNP is located in their office at time of visit.  I discussed the limitations, risks, security and privacy concerns of performing an evaluation and management service by telephone and the availability of in person appointments. I also discussed with the patient that there may be a patient responsible charge related to this service. The patient expressed understanding and agreed to proceed.   History and Present Illness:  Patient is on vacation. She was recently started on new meds for breast cancer and they said it may cause UTI. She now has dysuria, with frequency and urgency. Had some blood in urine      Review of Systems  Constitutional:  Negative for chills and fever.  Genitourinary:  Positive for dysuria, frequency and urgency.  Musculoskeletal:  Negative for back pain.    Observations/Objective: Alert and oriented- answers all questions appropriately No distress    Assessment and Plan: VELERA LANSDALE in today with chief complaint of UTI  1. Acute cystitis with hematuria Take medication as prescribe Cotton underwear Take shower not bath Cranberry juice, yogurt Force fluids AZO over the counter X2 days RTO prn  Meds ordered this encounter  Medications    nitrofurantoin, macrocrystal-monohydrate, (MACROBID) 100 MG capsule    Sig: Take 1 capsule (100 mg total) by mouth 2 (two) times daily. 1 po BId    Dispense:  14 capsule    Refill:  0    Order Specific Question:   Supervising Provider    Answer:   Caryl Pina A [[id number]]    Follow Up Instructions: prn    I discussed the assessment and treatment plan with the patient. The patient was provided an opportunity to ask questions and all were answered. The patient agreed with the plan and demonstrated an understanding of the instructions.   The patient was advised to call back or seek an in-person evaluation if the symptoms worsen or if the condition fails to improve as anticipated.  The above assessment and management plan was discussed with the patient. The patient verbalized understanding of and has agreed to the management plan. Patient is aware to call the clinic if symptoms persist or worsen. Patient is aware when to return to the clinic for a follow-up visit. Patient educated on when it is appropriate to go to the emergency department.   Time call ended:  3:40  I provided 12 minutes of  non face-to-face time during this encounter.    Mary-Margaret Hassell Done, FNP

## 2021-06-19 ENCOUNTER — Other Ambulatory Visit: Payer: BC Managed Care – PPO

## 2021-06-20 ENCOUNTER — Inpatient Hospital Stay: Payer: BC Managed Care – PPO | Admitting: Pharmacist

## 2021-06-20 DIAGNOSIS — Z170 Estrogen receptor positive status [ER+]: Secondary | ICD-10-CM

## 2021-06-20 NOTE — Progress Notes (Signed)
[place]       Telephone: [phone]?Fax: [phone]   Oncology Clinical Pharmacist Practitioner Initial Assessment  Shelby [name] is a 50 y.o. female with a diagnosis of breast cancer. They were contacted today via telephone visit.  Indication/Regimen Abemaciclib (Verzenio) is being used appropriately for treatment of breast cancer by Dr. Nicholas Lose.    Wt Readings from Last 1 Encounters:  06/01/21 (!) 406 lb 1.6 oz (184.2 kg)    Estimated body surface area is 2.91 meters squared as calculated from the following:   Height as of 04/05/21: 5\' 5"  (1.651 m).   Weight as of 06/01/21: 406 lb 1.6 oz (184.2 kg).  The dosing regimen is 150 mg by mouth every 12 hours on days 1 to 28 of a 28-day cycle. It is planned to continue until  2 years in the adjuvant setting per the [facility] trial data . Dr. Lindi Adie may dose escalate [name] starting at 50 mg every 12 hours which he has done for other patients. Dr. Lindi Adie referred Shelby [name] to clinical pharmacy to establish care and discuss potential side effects of abemaciclib should she start on this agent. Dr. Lindi Adie has not sent the prescription yet. Today's visit was more to discuss the abemaciclib and answer any questions she had.    She is having some arthralgias with the letrozole and we discussed that if they affect her quality of life, we can always try another agent  in the class or tamoxifen.  She states she does not have a history of blood clots or endometrial cancer if she does need to start tamoxifen at some point.  After our discussion today, Shelby [name] is ready to start abemaciclib.  Will forward information to Dr. Lindi Adie to see how he would like to proceed   Dose Modifications As above, no prescription sent yet.  Dr. Lindi Adie may decide to dose escalate  Access Assessment Shelby [name] will be receiving abemaciclib through  unknown at this time Insurance Concerns: patient states she has BC/BS Start date if known:  N/A  Allergies Allergies  Allergen Reactions   Codeine Swelling   Peach Flavor Other (See Comments)    Severe Migraines    Vitals Vitals with BMI 06/01/2021 04/05/2021 04/05/2021  Height - - -  Weight 406 lbs 2 oz - -  BMI - - -  Systolic 176 160 737  Diastolic 72 63 79  Pulse 73 74 70     Laboratory Data CBC EXTENDED Latest Ref Rng & Units 04/05/2021 12/15/2020 11/24/2020  WBC 4.0 - 10.5 K/uL 7.2 8.0 11.9(H)  RBC 3.87 - 5.11 MIL/uL 4.97 3.44(L) 3.91  HGB 12.0 - 15.0 g/dL 14.3 10.5(L) 12.0  HCT 36.0 - 46.0 % 44.1 32.3(L) 36.1  PLT 150 - 400 K/uL 224 374 312  NEUTROABS 1.7 - 7.7 K/uL - 5.3 8.5(H)  LYMPHSABS 0.7 - 4.0 K/uL - 2.1 2.6    CMP Latest Ref Rng & Units 04/05/2021 12/15/2020 11/24/2020  Glucose 70 - 99 mg/dL 109(H) 106(H) 112(H)  BUN 6 - 20 mg/dL 12 14 14   Creatinine 0.44 - 1.00 mg/dL 0.69 0.76 0.74  Sodium 135 - 145 mmol/L 138 138 142  Potassium 3.5 - 5.1 mmol/L 4.4 4.0 3.9  Chloride 98 - 111 mmol/L 105 105 107  CO2 22 - 32 mmol/L 22 25 23   Calcium 8.9 - 10.3 mg/dL 9.1 8.8(L) 9.3  Total Protein 6.5 - 8.1 g/dL - 6.8 7.1  Total Bilirubin 0.3 -  1.2 mg/dL - 0.3 0.5  Alkaline Phos 38 - 126 U/L - 56 83  AST 15 - 41 U/L - 17 13(L)  ALT 0 - 44 U/L - 17 17   No results found for: MG   Contraindications Contraindications were reviewed? Yes Contraindications to therapy were identified? No   Safety Precautions The following safety precautions for the use of abemaciclib were reviewed:  Diarrhea: we reviewed that diarrhea is common with abemaciclib and confirmed that will have  loperamide (Imodium) at home.   Neutropenia: we discussed the importance of having a thermometer and what the Centers for Disease Control and Prevention (CDC) considers a fever which is 100.65F (38C) or higher.  Gave patient 24/7 triage line to call if any fevers or symptoms ILD/Pneumonitis: we reviewed potential symptoms including cough, shortness, and fatigue. Hepatotoxicity: reviewed to contact  clinic for RUQ pain that will not subside, yellowing of eyes/skin Venous thromboembolism (VTE): reviewed signs of deep vein thrombosis (DVT) such as leg swelling, redness, pain, or tenderness and signs of pulmonary embolism (PE) such as shortness of breath, rapid or irregular heartbeat, cough, chest pain, or lightheadedness Reviewed to take the medication every 12 hours (with food sometimes can be easier on the stomach) and to take it at the same time every day. Discussed proper storage and handling of abemaciclib  Medication Reconciliation Current Outpatient Medications  Medication Sig Dispense Refill   cetirizine (ZYRTEC) 10 MG tablet Take 1 tablet (10 mg total) by mouth daily. 30 tablet 2   gabapentin (NEURONTIN) 100 MG capsule Take 1-3 capsules (100-300 mg total) by mouth at bedtime. Start and 1 tablet nightly and slowly increase to 3 tablets nightly as tolerated 90 capsule 0   ibuprofen (ADVIL) 800 MG tablet Take 1 tablet (800 mg total) by mouth every 8 (eight) hours as needed. (Patient not taking: Reported on 06/01/2021) 30 tablet 0   letrozole (FEMARA) 2.5 MG tablet Take 1 tablet (2.5 mg total) by mouth daily. (Patient taking differently: Take 2.5 mg by mouth at bedtime.) 90 tablet 3   loratadine (CLARITIN) 10 MG tablet Take 1 tablet (10 mg total) by mouth daily. (Patient taking differently: Take 10 mg by mouth 2 (two) times daily. Takes 1 in the morning and 1 in the the evening) 30 tablet 11   metFORMIN (GLUCOPHAGE) 500 MG tablet Take 1 tablet (500 mg total) by mouth 2 (two) times daily with a meal. (NEEDS TO BE SEEN BEFORE NEXT REFILL) 60 tablet 3   metoprolol tartrate (LOPRESSOR) 50 MG tablet Take 1 tablet (50 mg total) by mouth daily. 90 tablet 1   mupirocin cream (BACTROBAN) 2 % Apply 1 application topically 2 (two) times daily. (Patient not taking: Reported on 06/01/2021) 15 g 2   neomycin-bacitracin-polymyxin (NEOSPORIN) [phone] ointment Apply 1 application topically in the morning and  at bedtime. (Patient not taking: Reported on 06/01/2021) 28.3 g 1   nitrofurantoin, macrocrystal-monohydrate, (MACROBID) 100 MG capsule Take 1 capsule (100 mg total) by mouth 2 (two) times daily. 1 po BId 14 capsule 0   nystatin (MYCOSTATIN/NYSTOP) powder Apply 1 application topically 2 (two) times daily. (Patient not taking: Reported on 03/29/2021) 15 g 4   pantoprazole (PROTONIX) 40 MG tablet Take 1 tablet (40 mg total) by mouth at bedtime. (NEEDS TO BE SEEN BEFORE NEXT REFILL) 90 tablet 1   No current facility-administered medications for this visit.   Medication reconciliation is based on the patient's most recent medication list in the electronic medical record (EMR) including  herbal products and OTC medications.   The patient's medication list was reviewed today with the patient? No   Drug-drug interactions (DDIs) DDIs were evaluated? Yes DDIs identified? No   Drug-Food Interactions Drug-food interactions were evaluated? Yes Drug-food interactions identified?  Avoid grapefruit products  Follow-up Plan  Ms. Wooden is ready to start abemaciclib.  Will forward this information to Dr. Lindi Adie to see how he would like to proceed Next appointment with Dr. Lindi Adie is scheduled for 07/17/21. He may have her start prior and see clinical pharmacy while adjusting dose Discussed with Ms. Turcott that if she starts abemaciclib, manufacturer recommends labs every 2 weeks for 2 months, then monthly for 2 months, then as clinically indicated  BROOKSIE ELLWANGER participated in the discussion, expressed understanding, and voiced agreement with the above plan. All questions were answered to her satisfaction. The patient was advised to contact the clinic at [phone] with any questions or concerns prior to her return visit.   I spent 30 minutes assessing the patient.  Raina Mina, RPH-CPP, 06/20/2021 9:31 AM

## 2021-06-21 ENCOUNTER — Telehealth: Payer: Self-pay

## 2021-06-21 ENCOUNTER — Other Ambulatory Visit (HOSPITAL_COMMUNITY): Payer: Self-pay

## 2021-06-21 ENCOUNTER — Other Ambulatory Visit: Payer: Self-pay | Admitting: Hematology and Oncology

## 2021-06-21 MED ORDER — ABEMACICLIB 50 MG PO TABS
50.0000 mg | ORAL_TABLET | Freq: Two times a day (BID) | ORAL | 0 refills | Status: DC
  Filled 2021-06-21: qty 70, 35d supply, fill #0
  Filled 2021-06-27: qty 28, 14d supply, fill #0
  Filled 2021-07-11 (×2): qty 28, 14d supply, fill #1

## 2021-06-21 NOTE — Progress Notes (Signed)
Sent a prescription for abemaciclib.  She will start 50 mg p.o. twice daily for 1 month and if she tolerates it well then she will go up to 200 mg p.o. twice daily 2150 mg p.o. twice daily.   Duration of treatment: 1 year

## 2021-06-21 NOTE — Telephone Encounter (Signed)
Oral Oncology Pharmacist Encounter  Received new prescription for abemaciclib (Verzenio) for the treatment of early stage, high risk, HR positive, HER2 negative breast cancer in conjunction with letrozole, planned duration for 1 year or until disease progression or toxicity.  Labs from 10/6/2 assessed, no interventions needed.   Current medication list in Epic reviewed, DDIs with verzenio identified: - metformin concentration may be increased by verzenio- will monitor therapy  Evaluated chart and no patient barriers to medication adherence noted.   Patient agreement for treatment documented in MD note on 06/21/2021.  Prescription has been e-scribed to the [facility] for benefits analysis and approval.  Oral Oncology Clinic will continue to follow for insurance authorization, copayment issues, initial counseling and start date.  Drema Halon, PharmD Hematology/Oncology Clinical Pharmacist [facility] [phone] 06/21/2021 2:25 PM

## 2021-06-21 NOTE — Telephone Encounter (Signed)
Oral Oncology Patient Advocate Encounter   Received notification from [facility] that prior authorization for Verzenio is required.   PA submitted on CoverMyMeds Key [id number] Status is pending   Oral Oncology Clinic will continue to follow.  [place] Patient [place] Phone [phone] Fax [phone] 06/21/2021 2:38 PM

## 2021-06-26 ENCOUNTER — Other Ambulatory Visit (HOSPITAL_COMMUNITY): Payer: Self-pay

## 2021-06-26 NOTE — Telephone Encounter (Signed)
Oral Chemotherapy Pharmacist Encounter  I spoke with patient for overview of: Verzenio for the treatment of early stage, high risk, HER2 negative, hormone-receptor positive breast cancer, in combination with letrozole, planned duration until disease progression or unacceptable toxicity for a total of one year.   Counseled patient on administration, dosing, side effects, monitoring, drug-food interactions, safe handling, storage, and disposal.  Patient will take Verzenio 73m tablets, 1 tablet by mouth twice daily without regard to food. Patients will follow up with Dr. GLindi Adieand JJenny Reichmannfor dose escalations after 1 month   Patient knows to avoid grapefruit and grapefruit juice.  Verzenio start date: 06/28/2021  Adverse effects include but are not limited to: diarrhea, fatigue, nausea, abdominal pain, decreased blood counts, and increased liver function tests, and joint pains. Severe, life-threatening, and/or fatal interstitial lung disease (ILD) and/or pneumonitis may occur with CDK 4/6 inhibitors.  Patient has anti-emetic on hand and knows to take it if nausea develops.   Patient will obtain anti diarrheal and alert the office of 4 or more loose stools above baseline.  Reviewed with patient importance of keeping a medication schedule and plan for any missed doses. No barriers to medication adherence identified.  Medication reconciliation performed and medication/allergy list updated.  Insurance authorization for [employer] been obtained. Test claim at the pharmacy revealed copayment $0 for 1st fill of 56 (28 days ). [facility] [place] [facility] [place] to deliver to patient's home on 06/28/21.  Patient informed the pharmacy will reach out 5-7 days prior to needing next fill of Verzenio to coordinate continued medication acquisition to prevent break in therapy.  All questions answered.  Mrs. Valls voiced understanding and appreciation.   Medication  education handout placed in mail for patient. Patient knows to call the office with questions or concerns. Oral Chemotherapy Clinic phone number provided to patient.   KDrema Halon PharmD Hematology/Oncology Clinical Pharmacist [phone]/27/2022   11:47 AM

## 2021-06-27 ENCOUNTER — Other Ambulatory Visit (HOSPITAL_COMMUNITY): Payer: Self-pay

## 2021-07-02 ENCOUNTER — Other Ambulatory Visit: Payer: Self-pay | Admitting: Adult Health

## 2021-07-02 DIAGNOSIS — C50111 Malignant neoplasm of central portion of right female breast: Secondary | ICD-10-CM

## 2021-07-11 ENCOUNTER — Other Ambulatory Visit (HOSPITAL_COMMUNITY): Payer: Self-pay

## 2021-07-11 ENCOUNTER — Telehealth: Payer: Self-pay

## 2021-07-11 NOTE — Telephone Encounter (Signed)
Oral Oncology Patient Advocate Encounter  Prior Authorization for Shelby [name] has been approved.    PA# [id number] Effective dates: 06/21/21 through 06/20/22  Patients co-pay is $0  Oral Oncology Clinic will continue to follow.   [name] Patient [place] Phone [phone] Fax [phone] 07/11/2021 9:31 AM

## 2021-07-11 NOTE — Telephone Encounter (Signed)
Patient's Verzenio copay is $2290 due to new year and required deductible.   I was able to obtain a Verzenio copay card for the patient. Patient is eligible for $25,000 per calendar year.  Bin: [id number] PCN: [id number] Group: [id number] WG:[id number] Exp 05/2022  [name] [name] Phone [phone] Fax [phone] 07/11/2021 1:14 PM

## 2021-07-12 ENCOUNTER — Ambulatory Visit
Admission: RE | Admit: 2021-07-12 | Discharge: 2021-07-12 | Disposition: A | Discharge status: Home / Self Care | Payer: BC Managed Care – PPO | Source: Ambulatory Visit | Attending: Adult Health | Admitting: Adult Health

## 2021-07-12 ENCOUNTER — Other Ambulatory Visit: Payer: Self-pay | Admitting: Adult Health

## 2021-07-12 ENCOUNTER — Other Ambulatory Visit: Payer: BC Managed Care – PPO

## 2021-07-12 DIAGNOSIS — C50111 Malignant neoplasm of central portion of right female breast: Secondary | ICD-10-CM

## 2021-07-12 DIAGNOSIS — R590 Localized enlarged lymph nodes: Secondary | ICD-10-CM

## 2021-07-12 DIAGNOSIS — R928 Other abnormal and inconclusive findings on diagnostic imaging of breast: Secondary | ICD-10-CM | POA: Diagnosis not present

## 2021-07-12 DIAGNOSIS — Z170 Estrogen receptor positive status [ER+]: Secondary | ICD-10-CM

## 2021-07-12 DIAGNOSIS — N6489 Other specified disorders of breast: Secondary | ICD-10-CM | POA: Diagnosis not present

## 2021-07-12 HISTORY — DX: Personal history of antineoplastic chemotherapy: Z92.21

## 2021-07-12 HISTORY — DX: Personal history of irradiation: Z92.3

## 2021-07-12 NOTE — Progress Notes (Signed)
CT chest ordered to f/u on slight axillary adenopathy from 08/2020.  This ws unable to be completely evaluated with axillary ultrasound.    Wilber Bihari, NP 07/12/21 2:30 PM Medical Oncology and Hematology [facility] [place],  [address] Tel. [phone]    Fax. [phone]

## 2021-07-13 ENCOUNTER — Telehealth: Payer: Self-pay

## 2021-07-13 NOTE — Telephone Encounter (Signed)
Called to advise pt that [place] had poor visualization and based on prior CT we had planned to repeat this anyway.  Once approved through insurance we will schedule.  Pt verbalized understanding and thanks.

## 2021-07-16 ENCOUNTER — Other Ambulatory Visit: Payer: Self-pay | Admitting: *Deleted

## 2021-07-16 DIAGNOSIS — C50111 Malignant neoplasm of central portion of right female breast: Secondary | ICD-10-CM

## 2021-07-16 NOTE — Assessment & Plan Note (Signed)
07/04/2020:Screening mammogram detected right breast mass and right axillary lymph nodes. Right breast nipple retraction and retroareolar thickening. 2.4 cm mass with 2 abnormal lymph nodes. Biopsy revealed invasive lobular cancer grade 1-2 ER 95%, PR 95%, Ki-67 30%, HER2 equivocal by IHC negative by FISH ratio 1.39 T2N1 stageIIA  Right lumpectomy: 08/09/2020:ILC 3.8 cm, 19/19 LN Positive, Margins Neg  Plan: 1. Staging scans 2. Adjuvant chemo with TC X 6(because of her propensity for diabetes she may only end up getting 4) 3. XRT 01/25/2021- 03/07/21 4. Adj Anti estrogen therapy(with Verzenio) ------------------------------------------------------------------------------------------------------------------------------------ 08/30/2020: CT CAP:No evidence of metastatic disease 08/30/2020: Bone scan:No evidence of bone metastases --------------------------------------------------------------------------------------------------------------------  Anastrozole counseling: We discussed the risks and benefits of anti-estrogen therapy with aromatase inhibitors. These include but not limited to insomnia, hot flashes, mood changes, vaginal dryness, bone density loss, and weight gain. We strongly believe that the benefits far outweigh the risks. Patient understands these risks and consented to starting treatment. Planned treatment duration is 7 years.  Abemaciclib Toxicities: :  Breast Cancer Survilankce 1. Breast Exam: 07/16/21 2. Tumor

## 2021-07-16 NOTE — Progress Notes (Signed)
Patient Care Team: [name], NP as PCP - General (Nurse Practitioner) Nicholas Lose, MD as Consulting Physician (Hematology and Oncology) Eppie Gibson, MD as Attending Physician (Radiation Oncology) Erroll Luna, MD as Consulting Physician (General Surgery)  DIAGNOSIS:    ICD-10-CM   1. Malignant neoplasm of central portion of right breast in female, estrogen receptor positive ([place])  C50.111    Z17.0       SUMMARY OF ONCOLOGIC HISTORY: Oncology History  Malignant neoplasm of central portion of right breast in female, estrogen receptor positive ([place])  07/04/2020 Initial Diagnosis   Screening mammogram detected right breast mass and right axillary lymph nodes.  Right breast nipple retraction and retroareolar thickening.  2.4 cm mass with 2 abnormal lymph nodes.  Biopsy revealed invasive lobular cancer grade 1-2 ER 95%, PR 95%, Ki-67 30%, HER2 equivocal by IHC negative by FISH ratio 1.39   07/17/2020 Cancer Staging   Staging form: Breast, AJCC 8th Edition - Clinical stage from 07/17/2020: Stage IIA (cT2, cN1, cM0, G2, ER+, PR+, HER2-) - Signed by Nicholas Lose, MD on 07/17/2020    08/09/2020 Surgery   Rght lumpectomy ([place]): invasive lobular carcinoma, 3.8cm, clear margins, 19/19 right axillary lymph nodes positive for metastatic carcinoma.    08/09/2020 Cancer Staging   Staging form: Breast, AJCC 8th Edition - Pathologic stage from 08/09/2020: Stage IIIA (pT2, pN3a, cM0, G2, ER+, PR+, HER2-) - Signed by Gardenia Phlegm, NP on 08/23/2020 Stage prefix: Initial diagnosis Histologic grading system: 3 grade system    09/15/2020 - 11/24/2020 Chemotherapy   Adjuvant chemotherapy with TC Q21D x4       01/25/2021 - 03/07/2021 Radiation Therapy   Adjuvant radiation   03/2021 -  Anti-estrogen oral therapy   Letrozole daily     CHIEF COMPLIANT: Follow-up of breast cancer  INTERVAL HISTORY: Shelby [name] is a 51 y.o. with above-mentioned history of breast cancer. Adjuvant  chemotherapy with Taxotere and Cytoxan was stopped due to a right breast abscess. Mammogram on 07/12/2021 showed no evidence for malignancy. She presents to the clinic today for follow-up.  She is currently on Verzenio and appears to be tolerating it extremely well at 50 mg p.o. twice daily dosage.  She does not have any diarrhea.  Her only complaint is hot flashes which keep bothering her.  Denies any other side effects.  Interestingly should day she came in without the help of wheelchair.  She is able to walk without assistance.  ALLERGIES:  is allergic to codeine and peach flavor.  MEDICATIONS:  Current Outpatient Medications  Medication Sig Dispense Refill   [START ON 07/29/2021] abemaciclib (VERZENIO) 100 MG tablet Take 1 tablet (100 mg total) by mouth 2 (two) times daily. Swallow tablets whole. Do not chew, crush, or split tablets before swallowing. 60 tablet 0   cetirizine (ZYRTEC) 10 MG tablet Take 1 tablet (10 mg total) by mouth daily. 30 tablet 2   gabapentin (NEURONTIN) 100 MG capsule Take 1 to 3 capsules (100-300 mg total) by mouth at bedtime. Start with 1 nightly and slowly increase to 3 caps nightly as tolerated 90 capsule 0   ibuprofen (ADVIL) 800 MG tablet Take 1 tablet (800 mg total) by mouth every 8 (eight) hours as needed. (Patient not taking: Reported on 06/01/2021) 30 tablet 0   letrozole (FEMARA) 2.5 MG tablet Take 1 tablet (2.5 mg total) by mouth daily. (Patient taking differently: Take 2.5 mg by mouth at bedtime.) 90 tablet 3   loratadine (CLARITIN) 10 MG  tablet Take 1 tablet (10 mg total) by mouth daily. (Patient taking differently: Take 10 mg by mouth 2 (two) times daily. Takes 1 in the morning and 1 in the the evening) 30 tablet 11   metFORMIN (GLUCOPHAGE) 500 MG tablet Take 1 tablet (500 mg total) by mouth 2 (two) times daily with a meal. (NEEDS TO BE SEEN BEFORE NEXT REFILL) 60 tablet 3   metoprolol tartrate (LOPRESSOR) 50 MG tablet Take 1 tablet (50 mg total) by mouth  daily. 90 tablet 1   mupirocin cream (BACTROBAN) 2 % Apply 1 application topically 2 (two) times daily. (Patient not taking: Reported on 06/01/2021) 15 g 2   neomycin-bacitracin-polymyxin (NEOSPORIN) [phone] ointment Apply 1 application topically in the morning and at bedtime. (Patient not taking: Reported on 06/01/2021) 28.3 g 1   nitrofurantoin, macrocrystal-monohydrate, (MACROBID) 100 MG capsule Take 1 capsule (100 mg total) by mouth 2 (two) times daily. 1 po BId 14 capsule 0   nystatin (MYCOSTATIN/NYSTOP) powder Apply 1 application topically 2 (two) times daily. (Patient not taking: Reported on 03/29/2021) 15 g 4   pantoprazole (PROTONIX) 40 MG tablet Take 1 tablet (40 mg total) by mouth at bedtime. (NEEDS TO BE SEEN BEFORE NEXT REFILL) 90 tablet 1   No current facility-administered medications for this visit.    PHYSICAL EXAMINATION: ECOG PERFORMANCE STATUS: 1 - Symptomatic but completely ambulatory  Vitals:   07/17/21 0949 07/17/21 0950  BP: (!) 134/30 (!) 121/53  Pulse: 96   Resp: 19   Temp: (!) 97.2 F (36.2 C)   SpO2: 100%    Filed Weights   07/17/21 0949  Weight: (!) 416 lb 8 oz (188.9 kg)    LABORATORY DATA:  I have reviewed the data as listed CMP Latest Ref Rng & Units 07/17/2021 04/05/2021 12/15/2020  Glucose 70 - 99 mg/dL 122(H) 109(H) 106(H)  BUN 6 - 20 mg/dL _0 Creatinine 0.44 - 1.00 mg/dL 0.84 0.69 0.76  Sodium 135 - 145 mmol/L 141 138 138  Potassium 3.5 - 5.1 mmol/L 4.3 4.4 4.0  Chloride 98 - 111 mmol/L 104 105 105  CO2 22 - 32 mmol/L _1 Calcium 8.9 - 10.3 mg/dL 9.2 9.1 8.8(L)  Total Protein 6.5 - 8.1 g/dL 6.9 - 6.8  Total Bilirubin 0.3 - 1.2 mg/dL 0.4 - 0.3  Alkaline Phos 38 - 126 U/L 87 - 56  AST 15 - 41 U/L 10(L) - 17  ALT 0 - 44 U/L 14 - 17    Lab Results  Component Value Date   WBC 7.2 07/17/2021   HGB 13.2 07/17/2021   HCT 38.6 07/17/2021   MCV 88.5 07/17/2021   PLT 245 07/17/2021   NEUTROABS 5.2 07/17/2021    ASSESSMENT &  PLAN:  Malignant neoplasm of central portion of right breast in female, estrogen receptor positive ([place]) 07/04/2020:Screening mammogram detected right breast mass and right axillary lymph nodes.  Right breast nipple retraction and retroareolar thickening.  2.4 cm mass with 2 abnormal lymph nodes.  Biopsy revealed invasive lobular cancer grade 1-2 ER 95%, PR 95%, Ki-67 30%, HER2 equivocal by IHC negative by FISH ratio 1.39 T2N1 stage IIA   Right lumpectomy: 08/09/2020: ILC 3.8 cm, 19/19 LN Positive, Margins Neg   Plan: 1. Staging scans 2. Adjuvant chemo with TC X 6 (because of her propensity for diabetes she may only end up getting 4) 3. XRT 01/25/2021- 03/07/21 4. Adj Anti estrogen therapy (with Verzenio) ------------------------------------------------------------------------------------------------------------------------------------ 08/30/2020: CT CAP: No evidence  of metastatic disease 08/30/2020: Bone scan: No evidence of bone metastases --------------------------------------------------------------------------------------------------------------------  Anastrozole toxicities: Intermittent hot flashes Abemaciclib Toxicities: Denies any side effects, will increase the dosage 200 mg p.o. twice daily which will start in 2 weeks.  Breast Cancer Survilankce 1. Breast Exam: 07/16/21 CT chest has been ordered to evaluate the lymph nodes in the left axilla. I will call her with the result of the CT scan.    No orders of the defined types were placed in this encounter.  The patient has a good understanding of the overall plan. she agrees with it. she will call with any problems that may develop before the next visit here.  Total time spent: 30 mins including face to face time and time spent for planning, charting and coordination of care  Rulon Eisenmenger, MD, MPH 07/17/2021  I, Thana Ates, am acting as scribe for Dr. Nicholas Lose.  I have reviewed the above documentation for accuracy and  completeness, and I agree with the above.

## 2021-07-17 ENCOUNTER — Inpatient Hospital Stay: Payer: BC Managed Care – PPO | Attending: Hematology and Oncology

## 2021-07-17 ENCOUNTER — Other Ambulatory Visit: Payer: Self-pay

## 2021-07-17 ENCOUNTER — Inpatient Hospital Stay (HOSPITAL_BASED_OUTPATIENT_CLINIC_OR_DEPARTMENT_OTHER): Payer: BC Managed Care – PPO | Admitting: Hematology and Oncology

## 2021-07-17 ENCOUNTER — Other Ambulatory Visit (HOSPITAL_COMMUNITY): Payer: Self-pay

## 2021-07-17 DIAGNOSIS — C50111 Malignant neoplasm of central portion of right female breast: Secondary | ICD-10-CM

## 2021-07-17 DIAGNOSIS — Z170 Estrogen receptor positive status [ER+]: Secondary | ICD-10-CM | POA: Insufficient documentation

## 2021-07-17 DIAGNOSIS — C773 Secondary and unspecified malignant neoplasm of axilla and upper limb lymph nodes: Secondary | ICD-10-CM | POA: Diagnosis not present

## 2021-07-17 DIAGNOSIS — R232 Flushing: Secondary | ICD-10-CM | POA: Diagnosis not present

## 2021-07-17 LAB — CBC WITH DIFFERENTIAL (CANCER CENTER ONLY)
Abs Immature Granulocytes: 0.02 10*3/uL (ref 0.00–0.07)
Basophils Absolute: 0 10*3/uL (ref 0.0–0.1)
Basophils Relative: 0 %
Eosinophils Absolute: 0.1 10*3/uL (ref 0.0–0.5)
Eosinophils Relative: 2 %
HCT: 38.6 % (ref 36.0–46.0)
Hemoglobin: 13.2 g/dL (ref 12.0–15.0)
Immature Granulocytes: 0 %
Lymphocytes Relative: 21 %
Lymphs Abs: 1.5 10*3/uL (ref 0.7–4.0)
MCH: 30.3 pg (ref 26.0–34.0)
MCHC: 34.2 g/dL (ref 30.0–36.0)
MCV: 88.5 fL (ref 80.0–100.0)
Monocytes Absolute: 0.3 10*3/uL (ref 0.1–1.0)
Monocytes Relative: 4 %
Neutro Abs: 5.2 10*3/uL (ref 1.7–7.7)
Neutrophils Relative %: 73 %
Platelet Count: 245 10*3/uL (ref 150–400)
RBC: 4.36 MIL/uL (ref 3.87–5.11)
RDW: 13.6 % (ref 11.5–15.5)
WBC Count: 7.2 10*3/uL (ref 4.0–10.5)
nRBC: 0 % (ref 0.0–0.2)

## 2021-07-17 LAB — CMP (CANCER CENTER ONLY)
ALT: 14 U/L (ref 0–44)
AST: 10 U/L — ABNORMAL LOW (ref 15–41)
Albumin: 3.8 g/dL (ref 3.5–5.0)
Alkaline Phosphatase: 87 U/L (ref 38–126)
Anion gap: 11 (ref 5–15)
BUN: 17 mg/dL (ref 6–20)
CO2: 26 mmol/L (ref 22–32)
Calcium: 9.2 mg/dL (ref 8.9–10.3)
Chloride: 104 mmol/L (ref 98–111)
Creatinine: 0.84 mg/dL (ref 0.44–1.00)
GFR, Estimated: >60 mL/min (ref >60)
Glucose, Bld: 122 mg/dL — ABNORMAL HIGH (ref 70–99)
Potassium: 4.3 mmol/L (ref 3.5–5.1)
Sodium: 141 mmol/L (ref 135–145)
Total Bilirubin: 0.4 mg/dL (ref 0.3–1.2)
Total Protein: 6.9 g/dL (ref 6.5–8.1)

## 2021-07-17 MED ORDER — ABEMACICLIB 100 MG PO TABS
100.0000 mg | ORAL_TABLET | Freq: Two times a day (BID) | ORAL | 0 refills | Status: DC
  Filled 2021-07-17: qty 70, 35d supply, fill #0
  Filled 2021-07-23: qty 28, 14d supply, fill #0
  Filled 2021-08-01: qty 28, 14d supply, fill #1

## 2021-07-18 ENCOUNTER — Telehealth: Payer: Self-pay | Admitting: Hematology and Oncology

## 2021-07-18 ENCOUNTER — Other Ambulatory Visit (HOSPITAL_COMMUNITY): Payer: Self-pay

## 2021-07-18 NOTE — Telephone Encounter (Signed)
Scheduled appointment per 01/17 los. Patient is aware.

## 2021-07-19 ENCOUNTER — Ambulatory Visit (HOSPITAL_COMMUNITY)
Admission: RE | Admit: 2021-07-19 | Discharge: 2021-07-19 | Disposition: A | Discharge status: Home / Self Care | Payer: BC Managed Care – PPO | Source: Ambulatory Visit | Attending: Adult Health | Admitting: Adult Health

## 2021-07-19 ENCOUNTER — Telehealth: Payer: Self-pay | Admitting: *Deleted

## 2021-07-19 ENCOUNTER — Other Ambulatory Visit: Payer: Self-pay

## 2021-07-19 DIAGNOSIS — R590 Localized enlarged lymph nodes: Secondary | ICD-10-CM | POA: Diagnosis not present

## 2021-07-19 DIAGNOSIS — R918 Other nonspecific abnormal finding of lung field: Secondary | ICD-10-CM | POA: Diagnosis not present

## 2021-07-19 DIAGNOSIS — C50111 Malignant neoplasm of central portion of right female breast: Secondary | ICD-10-CM | POA: Diagnosis not present

## 2021-07-19 DIAGNOSIS — Z170 Estrogen receptor positive status [ER+]: Secondary | ICD-10-CM | POA: Diagnosis not present

## 2021-07-19 DIAGNOSIS — C50919 Malignant neoplasm of unspecified site of unspecified female breast: Secondary | ICD-10-CM | POA: Diagnosis not present

## 2021-07-19 MED ORDER — IOHEXOL 300 MG/ML  SOLN
75.0000 mL | Freq: Once | INTRAMUSCULAR | Status: AC | PRN
  Administered 2021-07-19: 75 mL via INTRAVENOUS

## 2021-07-19 MED ORDER — SODIUM CHLORIDE (PF) 0.9 % IJ SOLN
INTRAMUSCULAR | Status: AC
  Filled 2021-07-19: qty 50

## 2021-07-19 NOTE — Telephone Encounter (Signed)
Per Annabelle Harman, called pt with message below. Advised pt to call office with any other concerns. Pt verbalized understanding

## 2021-07-19 NOTE — Telephone Encounter (Signed)
-----   Message from Gardenia Phlegm, NP sent at 07/19/2021  1:00 PM EST ----- Please let the patient know that her CT chest looks good.  She has some radiation changes which are expected.  The lymph nodes that she previously had on imaging have resolved and she does have some fatty liver and can follow-up with her primary care about that.  Thanks so much ----- Message ----- From: Interface, Rad Results In Sent: 07/19/2021  12:28 PM EST To: Gardenia Phlegm, NP

## 2021-07-23 ENCOUNTER — Other Ambulatory Visit (HOSPITAL_COMMUNITY): Payer: Self-pay

## 2021-07-24 ENCOUNTER — Other Ambulatory Visit (HOSPITAL_COMMUNITY): Payer: Self-pay

## 2021-07-30 ENCOUNTER — Other Ambulatory Visit: Payer: Self-pay | Admitting: Nurse Practitioner

## 2021-07-30 DIAGNOSIS — J301 Allergic rhinitis due to pollen: Secondary | ICD-10-CM

## 2021-07-31 ENCOUNTER — Other Ambulatory Visit: Payer: Self-pay | Admitting: *Deleted

## 2021-07-31 DIAGNOSIS — C50111 Malignant neoplasm of central portion of right female breast: Secondary | ICD-10-CM

## 2021-07-31 DIAGNOSIS — Z170 Estrogen receptor positive status [ER+]: Secondary | ICD-10-CM

## 2021-07-31 NOTE — Progress Notes (Signed)
Patient Care Team: [name], NP as PCP - General (Nurse Practitioner) Nicholas Lose, MD as Consulting Physician (Hematology and Oncology) Eppie Gibson, MD as Attending Physician (Radiation Oncology) Erroll Luna, MD as Consulting Physician (General Surgery)  DIAGNOSIS:    ICD-10-CM   1. Malignant neoplasm of central portion of right breast in female, estrogen receptor positive ([place])  C50.111    Z17.0       SUMMARY OF ONCOLOGIC HISTORY: Oncology History  Malignant neoplasm of central portion of right breast in female, estrogen receptor positive ([place])  07/04/2020 Initial Diagnosis   Screening mammogram detected right breast mass and right axillary lymph nodes.  Right breast nipple retraction and retroareolar thickening.  2.4 cm mass with 2 abnormal lymph nodes.  Biopsy revealed invasive lobular cancer grade 1-2 ER 95%, PR 95%, Ki-67 30%, HER2 equivocal by IHC negative by FISH ratio 1.39   07/17/2020 Cancer Staging   Staging form: Breast, AJCC 8th Edition - Clinical stage from 07/17/2020: Stage IIA (cT2, cN1, cM0, G2, ER+, PR+, HER2-) - Signed by Nicholas Lose, MD on 07/17/2020    08/09/2020 Surgery   Rght lumpectomy ([facility]): invasive lobular carcinoma, 3.8cm, clear margins, 19/19 right axillary lymph nodes positive for metastatic carcinoma.    08/09/2020 Cancer Staging   Staging form: Breast, AJCC 8th Edition - Pathologic stage from 08/09/2020: Stage IIIA (pT2, pN3a, cM0, G2, ER+, PR+, HER2-) - Signed by Gardenia Phlegm, NP on 08/23/2020 Stage prefix: Initial diagnosis Histologic grading system: 3 grade system    09/15/2020 - 11/24/2020 Chemotherapy   Adjuvant chemotherapy with TC Q21D x4       01/25/2021 - 03/07/2021 Radiation Therapy   Adjuvant radiation   03/2021 -  Anti-estrogen oral therapy   Letrozole daily     CHIEF COMPLIANT: Follow-up of breast cancer  INTERVAL HISTORY: Shelby [name] is a 51 y.o. with above-mentioned history of breast cancer. Adjuvant  chemotherapy with Taxotere and Cytoxan was stopped due to a right breast abscess. She presents to the clinic today for follow-up.   ALLERGIES:  is allergic to codeine and peach flavor.  MEDICATIONS:  Current Outpatient Medications  Medication Sig Dispense Refill   abemaciclib (VERZENIO) 100 MG tablet Take 1 tablet (100 mg total) by mouth 2 (two) times daily. Swallow tablets whole. Do not chew, crush, or split tablets before swallowing. 60 tablet 0   cetirizine (ZYRTEC) 10 MG tablet Take 1 tablet (10 mg total) by mouth daily. 30 tablet 2   gabapentin (NEURONTIN) 100 MG capsule Take 1 to 3 capsules (100-300 mg total) by mouth at bedtime. Start with 1 nightly and slowly increase to 3 caps nightly as tolerated 90 capsule 0   ibuprofen (ADVIL) 800 MG tablet Take 1 tablet (800 mg total) by mouth every 8 (eight) hours as needed. (Patient not taking: Reported on 06/01/2021) 30 tablet 0   letrozole (FEMARA) 2.5 MG tablet Take 1 tablet (2.5 mg total) by mouth daily. (Patient taking differently: Take 2.5 mg by mouth at bedtime.) 90 tablet 3   loratadine (CLARITIN) 10 MG tablet Take 1 tablet (10 mg total) by mouth daily. (Patient taking differently: Take 10 mg by mouth 2 (two) times daily. Takes 1 in the morning and 1 in the the evening) 30 tablet 11   metFORMIN (GLUCOPHAGE) 500 MG tablet Take 1 tablet (500 mg total) by mouth 2 (two) times daily with a meal. (NEEDS TO BE SEEN BEFORE NEXT REFILL) 60 tablet 3   metoprolol tartrate (LOPRESSOR) 50 MG  tablet Take 1 tablet (50 mg total) by mouth daily. 90 tablet 1   mupirocin cream (BACTROBAN) 2 % Apply 1 application topically 2 (two) times daily. (Patient not taking: Reported on 06/01/2021) 15 g 2   neomycin-bacitracin-polymyxin (NEOSPORIN) [phone] ointment Apply 1 application topically in the morning and at bedtime. (Patient not taking: Reported on 06/01/2021) 28.3 g 1   nitrofurantoin, macrocrystal-monohydrate, (MACROBID) 100 MG capsule Take 1 capsule (100 mg  total) by mouth 2 (two) times daily. 1 po BId 14 capsule 0   nystatin (MYCOSTATIN/NYSTOP) powder Apply 1 application topically 2 (two) times daily. (Patient not taking: Reported on 03/29/2021) 15 g 4   pantoprazole (PROTONIX) 40 MG tablet Take 1 tablet (40 mg total) by mouth at bedtime. (NEEDS TO BE SEEN BEFORE NEXT REFILL) 90 tablet 1   No current facility-administered medications for this visit.    PHYSICAL EXAMINATION: ECOG PERFORMANCE STATUS: 1 - Symptomatic but completely ambulatory  Vitals:   08/01/21 0856  BP: (!) 158/88  Pulse: 93  Resp: 17  Temp: (!) 97.5 F (36.4 C)  SpO2: 96%   Filed Weights   08/01/21 0856  Weight: (!) 407 lb (184.6 kg)    BREAST: No palpable masses or nodules in either right or left breasts. No palpable axillary supraclavicular or infraclavicular adenopathy no breast tenderness or nipple discharge. (exam performed in the presence of a chaperone)  LABORATORY DATA:  I have reviewed the data as listed CMP Latest Ref Rng & Units 07/17/2021 04/05/2021 12/15/2020  Glucose 70 - 99 mg/dL 122(H) 109(H) 106(H)  BUN 6 - 20 mg/dL 17 12 14   Creatinine 0.44 - 1.00 mg/dL 0.84 0.69 0.76  Sodium 135 - 145 mmol/L 141 138 138  Potassium 3.5 - 5.1 mmol/L 4.3 4.4 4.0  Chloride 98 - 111 mmol/L 104 105 105  CO2 22 - 32 mmol/L 26 22 25   Calcium 8.9 - 10.3 mg/dL 9.2 9.1 8.8(L)  Total Protein 6.5 - 8.1 g/dL 6.9 - 6.8  Total Bilirubin 0.3 - 1.2 mg/dL 0.4 - 0.3  Alkaline Phos 38 - 126 U/L 87 - 56  AST 15 - 41 U/L 10(L) - 17  ALT 0 - 44 U/L 14 - 17    Lab Results  Component Value Date   WBC 4.9 08/01/2021   HGB 13.7 08/01/2021   HCT 41.0 08/01/2021   MCV 89.3 08/01/2021   PLT 250 08/01/2021   NEUTROABS 3.3 08/01/2021    ASSESSMENT & PLAN:  Malignant neoplasm of central portion of right breast in female, estrogen receptor positive ([place]) 07/04/2020:Screening mammogram detected right breast mass and right axillary lymph nodes.  Right breast nipple retraction and  retroareolar thickening.  2.4 cm mass with 2 abnormal lymph nodes.  Biopsy revealed invasive lobular cancer grade 1-2 ER 95%, PR 95%, Ki-67 30%, HER2 equivocal by IHC negative by FISH ratio 1.39 T2N1 stage IIA   Right lumpectomy: 08/09/2020: ILC 3.8 cm, 19/19 LN Positive, Margins Neg   Plan: 1. Staging scans 2. Adjuvant chemo with TC X 6 (because of her propensity for diabetes she may only end up getting 4) 3. XRT 01/25/2021- 03/07/21 4. Adj Anti estrogen therapy (with Verzenio) ------------------------------------------------------------------------------------------------------------------------------------ 08/30/2020: CT CAP: No evidence of metastatic disease 08/30/2020: Bone scan: No evidence of bone metastases --------------------------------------------------------------------------------------------------------------------  Anastrozole toxicities: Intermittent hot flashes Abemaciclib Toxicities: Denies any side effects, current dosage: 100 mg twice a day 2-3 loose stools per day but manageable. We will keep the current doses the same.  Breast Cancer Surveillance 1.  Breast Exam: 07/16/21 2. CT chest 07/19/21: Radiation fibrosis, Resolution of Rt Axillary LN, Hep steatosis 3. Mammogram 07/12/21: Benign Density Cat B  She is working hard to lose weight.  She lost about 9 pounds since the last time we saw her. Return to clinic in 4 weeks for toxicity check.  We will do a MyChart virtual visit.   No orders of the defined types were placed in this encounter.  The patient has a good understanding of the overall plan. she agrees with it. she will call with any problems that may develop before the next visit here.  Total time spent: 30 mins including face to face time and time spent for planning, charting and coordination of care  Rulon Eisenmenger, MD, MPH 08/01/2021  I, Thana Ates, am acting as scribe for Dr. Nicholas Lose.  I have reviewed the above documentation for accuracy and  completeness, and I agree with the above.

## 2021-07-31 NOTE — Assessment & Plan Note (Signed)
07/04/2020:Screening mammogram detected right breast mass and right axillary lymph nodes. Right breast nipple retraction and retroareolar thickening. 2.4 cm mass with 2 abnormal lymph nodes. Biopsy revealed invasive lobular cancer grade 1-2 ER 95%, PR 95%, Ki-67 30%, HER2 equivocal by IHC negative by FISH ratio 1.39 T2N1 stageIIA  Right lumpectomy: 08/09/2020:ILC 3.8 cm, 19/19 LN Positive, Margins Neg  Plan: 1. Staging scans 2. Adjuvant chemo with TC X 6(because of her propensity for diabetes she may only end up getting 4) 3. XRT 01/25/2021- 03/07/21 4. Adj Anti estrogen therapy(with Verzenio) ------------------------------------------------------------------------------------------------------------------------------------ 08/30/2020: CT CAP:No evidence of metastatic disease 08/30/2020: Bone scan:No evidence of bone metastases --------------------------------------------------------------------------------------------------------------------  Anastrozole toxicities: Intermittent hot flashes Abemaciclib Toxicities: Denies any side effects, will increase the dosage 200 mg p.o. twice daily which will start in 2 weeks.  Breast Cancer Surveillance 1. Breast Exam: 07/16/21 2. CT chest 07/19/21: Radiation fibrosis, Resolution of Rt Axillary LN, Hep steatosis 3. Mammogram 07/12/21: Benign Density Cat B

## 2021-08-01 ENCOUNTER — Other Ambulatory Visit (HOSPITAL_COMMUNITY): Payer: Self-pay

## 2021-08-01 ENCOUNTER — Inpatient Hospital Stay: Payer: BC Managed Care – PPO | Attending: Hematology and Oncology

## 2021-08-01 ENCOUNTER — Inpatient Hospital Stay (HOSPITAL_BASED_OUTPATIENT_CLINIC_OR_DEPARTMENT_OTHER): Payer: BC Managed Care – PPO | Admitting: Hematology and Oncology

## 2021-08-01 ENCOUNTER — Other Ambulatory Visit: Payer: Self-pay

## 2021-08-01 DIAGNOSIS — C50111 Malignant neoplasm of central portion of right female breast: Secondary | ICD-10-CM | POA: Diagnosis not present

## 2021-08-01 DIAGNOSIS — C773 Secondary and unspecified malignant neoplasm of axilla and upper limb lymph nodes: Secondary | ICD-10-CM | POA: Insufficient documentation

## 2021-08-01 DIAGNOSIS — Z170 Estrogen receptor positive status [ER+]: Secondary | ICD-10-CM

## 2021-08-01 DIAGNOSIS — R232 Flushing: Secondary | ICD-10-CM | POA: Diagnosis not present

## 2021-08-01 LAB — CBC WITH DIFFERENTIAL (CANCER CENTER ONLY)
Abs Immature Granulocytes: 0.02 10*3/uL (ref 0.00–0.07)
Basophils Absolute: 0 10*3/uL (ref 0.0–0.1)
Basophils Relative: 1 %
Eosinophils Absolute: 0.1 10*3/uL (ref 0.0–0.5)
Eosinophils Relative: 1 %
HCT: 41 % (ref 36.0–46.0)
Hemoglobin: 13.7 g/dL (ref 12.0–15.0)
Immature Granulocytes: 0 %
Lymphocytes Relative: 26 %
Lymphs Abs: 1.3 10*3/uL (ref 0.7–4.0)
MCH: 29.8 pg (ref 26.0–34.0)
MCHC: 33.4 g/dL (ref 30.0–36.0)
MCV: 89.3 fL (ref 80.0–100.0)
Monocytes Absolute: 0.2 10*3/uL (ref 0.1–1.0)
Monocytes Relative: 5 %
Neutro Abs: 3.3 10*3/uL (ref 1.7–7.7)
Neutrophils Relative %: 67 %
Platelet Count: 250 10*3/uL (ref 150–400)
RBC: 4.59 MIL/uL (ref 3.87–5.11)
RDW: 13.8 % (ref 11.5–15.5)
WBC Count: 4.9 10*3/uL (ref 4.0–10.5)
nRBC: 0 % (ref 0.0–0.2)

## 2021-08-01 LAB — CMP (CANCER CENTER ONLY)
ALT: 19 U/L (ref 0–44)
AST: 16 U/L (ref 15–41)
Albumin: 4.2 g/dL (ref 3.5–5.0)
Alkaline Phosphatase: 92 U/L (ref 38–126)
Anion gap: 11 (ref 5–15)
BUN: 17 mg/dL (ref 6–20)
CO2: 23 mmol/L (ref 22–32)
Calcium: 9.7 mg/dL (ref 8.9–10.3)
Chloride: 107 mmol/L (ref 98–111)
Creatinine: 0.89 mg/dL (ref 0.44–1.00)
GFR, Estimated: >60 mL/min (ref >60)
Glucose, Bld: 111 mg/dL — ABNORMAL HIGH (ref 70–99)
Potassium: 4 mmol/L (ref 3.5–5.1)
Sodium: 141 mmol/L (ref 135–145)
Total Bilirubin: 0.4 mg/dL (ref 0.3–1.2)
Total Protein: 7.6 g/dL (ref 6.5–8.1)

## 2021-08-06 ENCOUNTER — Other Ambulatory Visit (HOSPITAL_COMMUNITY): Payer: Self-pay

## 2021-08-13 ENCOUNTER — Other Ambulatory Visit (HOSPITAL_COMMUNITY): Payer: Self-pay

## 2021-08-13 ENCOUNTER — Other Ambulatory Visit: Payer: Self-pay | Admitting: Hematology and Oncology

## 2021-08-13 NOTE — Telephone Encounter (Signed)
Hildred Priest, can you please review and refill.  Thanks!

## 2021-08-14 ENCOUNTER — Other Ambulatory Visit (HOSPITAL_COMMUNITY): Payer: Self-pay

## 2021-08-14 MED ORDER — ABEMACICLIB 100 MG PO TABS
100.0000 mg | ORAL_TABLET | Freq: Two times a day (BID) | ORAL | 0 refills | Status: DC
  Filled 2021-08-14: qty 56, 28d supply, fill #0

## 2021-08-15 ENCOUNTER — Other Ambulatory Visit (HOSPITAL_COMMUNITY): Payer: Self-pay

## 2021-08-20 ENCOUNTER — Other Ambulatory Visit (HOSPITAL_COMMUNITY): Payer: Self-pay

## 2021-08-24 ENCOUNTER — Other Ambulatory Visit (HOSPITAL_COMMUNITY): Payer: Self-pay

## 2021-08-27 ENCOUNTER — Other Ambulatory Visit: Payer: Self-pay | Admitting: Nurse Practitioner

## 2021-08-27 ENCOUNTER — Other Ambulatory Visit: Payer: Self-pay | Admitting: Adult Health

## 2021-08-27 DIAGNOSIS — C50111 Malignant neoplasm of central portion of right female breast: Secondary | ICD-10-CM

## 2021-08-27 DIAGNOSIS — R002 Palpitations: Secondary | ICD-10-CM

## 2021-08-27 DIAGNOSIS — K219 Gastro-esophageal reflux disease without esophagitis: Secondary | ICD-10-CM

## 2021-08-27 DIAGNOSIS — R7303 Prediabetes: Secondary | ICD-10-CM

## 2021-08-27 DIAGNOSIS — I10 Essential (primary) hypertension: Secondary | ICD-10-CM

## 2021-08-27 NOTE — Telephone Encounter (Signed)
30 days given ntbs  

## 2021-08-27 NOTE — Telephone Encounter (Signed)
PT MADE APPT ON April 3 FOR MED REFILL

## 2021-08-28 NOTE — Assessment & Plan Note (Signed)
07/04/2020:Screening mammogram detected right breast mass and right axillary lymph nodes. Right breast nipple retraction and retroareolar thickening. 2.4 cm mass with 2 abnormal lymph nodes. Biopsy revealed invasive lobular cancer grade 1-2 ER 95%, PR 95%, Ki-67 30%, HER2 equivocal by IHC negative by FISH ratio 1.39 T2N1 stageIIA  Right lumpectomy: 08/09/2020:ILC 3.8 cm, 19/19 LN Positive, Margins Neg  Plan: 1. Staging scans 2. Adjuvant chemo with TC X 6(because of her propensity for diabetes she may only end up getting 4) 3. XRT 01/25/2021- 03/07/21 4. Adj Anti estrogen therapy(with Verzenio) ------------------------------------------------------------------------------------------------------------------------------------ 08/30/2020: CT CAP:No evidence of metastatic disease 08/30/2020: Bone scan:No evidence of bone metastases -------------------------------------------------------------------------------------------------------------------- Anastrozole toxicities:Intermittent hot flashes AbemaciclibToxicities:Denies any side effects, current dosage: 100 mg twice a day 2-3 loose stools per day but manageable. We will keep the current doses the same.  Breast Cancer Surveillance 1. Breast Exam: 07/16/21 2. CT chest 07/19/21: Radiation fibrosis, Resolution of Rt Axillary LN, Hep steatosis 3. Mammogram 07/12/21: Benign Density Cat B  She is working hard to lose weight.  She lost about 9 pounds since the last time we saw her. Return to clinic in 4 weeks for toxicity check.  We will do a MyChart virtual visit.

## 2021-08-28 NOTE — Progress Notes (Signed)
?HEMATOLOGY-ONCOLOGY TELEPHONE VISIT PROGRESS NOTE ? ?I connected with Shelby [name] on 08/29/2021 at  8:15 AM EST by MyChart video conference and verified that I am speaking with the correct person using two identifiers.  ?I discussed the limitations, risks, security and privacy concerns of performing an evaluation and management service by MyChart and the availability of in person appointments.  ?I also discussed with the patient that there may be a patient responsible charge related to this service. The patient expressed understanding and agreed to proceed.  ?MyChart did not function well and therefore we did a telephone visit ? ?CHIEF COMPLIANT: Follow-up of breast cancer on Verzenio ? ?INTERVAL HISTORY: Shelby [name] is a 51 y.o. female with above-mentioned history of cancer. Adjuvant chemotherapy with Taxotere and Cytoxan was stopped due to a right breast abscess.  She presents via telephone visit for follow-up.  She is tolerating Verzenio extremely well without any problems or concerns.  Diarrhea has almost subsided.  Hot flashes are very manageable. ? ?Oncology History  ?Malignant neoplasm of central portion of right breast in female, estrogen receptor positive ([place])  ?07/04/2020 Initial Diagnosis  ? Screening mammogram detected right breast mass and right axillary lymph nodes.  Right breast nipple retraction and retroareolar thickening.  2.4 cm mass with 2 abnormal lymph nodes.  Biopsy revealed invasive lobular cancer grade 1-2 ER 95%, PR 95%, Ki-67 30%, HER2 equivocal by IHC negative by FISH ratio 1.39 ?  ?07/17/2020 Cancer Staging  ? Staging form: Breast, AJCC 8th Edition ?- Clinical stage from 07/17/2020: Stage IIA (cT2, cN1, cM0, G2, ER+, PR+, HER2-) - Signed by Nicholas Lose, MD on 07/17/2020 ? ?  ?08/09/2020 Surgery  ? Rght lumpectomy ([facility]): invasive lobular carcinoma, 3.8cm, clear margins, 19/19 right axillary lymph nodes positive for metastatic carcinoma.  ?  ?08/09/2020 Cancer Staging  ? Staging form:  Breast, AJCC 8th Edition ?- Pathologic stage from 08/09/2020: Stage IIIA (pT2, pN3a, cM0, G2, ER+, PR+, HER2-) - Signed by Gardenia Phlegm, NP on 08/23/2020 ?Stage prefix: Initial diagnosis ?Histologic grading system: 3 grade system ? ?  ?09/15/2020 - 11/24/2020 Chemotherapy  ? Adjuvant chemotherapy with TC Q21D x4 ? ?  ? ?  ?01/25/2021 - 03/07/2021 Radiation Therapy  ? Adjuvant radiation ?  ?03/2021 -  Anti-estrogen oral therapy  ? Letrozole daily ?  ? ? ?Observations/Objective:  ?There were no vitals filed for this visit. ?There is no height or weight on file to calculate BMI.  ?I have reviewed the data as listed ?CMP Latest Ref Rng & Units 08/01/2021 07/17/2021 04/05/2021  ?Glucose 70 - 99 mg/dL 111(H) 122(H) 109(H)  ?BUN 6 - 20 mg/dL _0 ?Creatinine 0.44 - 1.00 mg/dL 0.89 0.84 0.69  ?Sodium 135 - 145 mmol/L 141 141 138  ?Potassium 3.5 - 5.1 mmol/L 4.0 4.3 4.4  ?Chloride 98 - 111 mmol/L 107 104 105  ?CO2 22 - 32 mmol/L _1 ?Calcium 8.9 - 10.3 mg/dL 9.7 9.2 9.1  ?Total Protein 6.5 - 8.1 g/dL 7.6 6.9 -  ?Total Bilirubin 0.3 - 1.2 mg/dL 0.4 0.4 -  ?Alkaline Phos 38 - 126 U/L 92 87 -  ?AST 15 - 41 U/L 16 10(L) -  ?ALT 0 - 44 U/L 19 14 -  ? ? ?Lab Results  ?Component Value Date  ? WBC 4.9 08/01/2021  ? HGB 13.7 08/01/2021  ? HCT 41.0 08/01/2021  ? MCV 89.3 08/01/2021  ? PLT 250 08/01/2021  ? NEUTROABS 3.3 08/01/2021  ? ? ?  ?  Assessment Plan:  ?Malignant neoplasm of central portion of right breast in female, estrogen receptor positive ([place]) ?07/04/2020:Screening mammogram detected right breast mass and right axillary lymph nodes.  Right breast nipple retraction and retroareolar thickening.  2.4 cm mass with 2 abnormal lymph nodes.  Biopsy revealed invasive lobular cancer grade 1-2 ER 95%, PR 95%, Ki-67 30%, HER2 equivocal by IHC negative by FISH ratio 1.39 ?T2N1 stage IIA ?  ?Right lumpectomy: 08/09/2020: ILC 3.8 cm, 19/19 LN Positive, Margins Neg ?  ?Plan: ?1. Staging scans ?2. Adjuvant chemo with TC X 6 (because  of her propensity for diabetes she may only end up getting 4) ?3. XRT 01/25/2021- 03/07/21 ?4. Adj Anti estrogen therapy (with Verzenio) ?------------------------------------------------------------------------------------------------------------------------------------ ?08/30/2020: CT CAP: No evidence of metastatic disease ?08/30/2020: Bone scan: No evidence of bone metastases ?-------------------------------------------------------------------------------------------------------------------- ? Anastrozole toxicities: Intermittent hot flashes ?Abemaciclib Toxicities: Denies any side effects, current dosage: 100 mg twice a day (started December 2022) ?Very occasional diarrhea ?We will keep the current doses the same. ?  ?Breast Cancer Surveillance ?1. Breast Exam: 07/16/21 ?2. CT chest 07/19/21: Radiation fibrosis, Resolution of Rt Axillary LN, Hep steatosis ?3. Mammogram 07/12/21: Benign Density Cat B ?  ?She is working hard to lose weight.   ?Return to clinic in 2 months with labs and follow-up ? ? ?I discussed the assessment and treatment plan with the patient. The patient was provided an opportunity to ask questions and all were answered. The patient agreed with the plan and demonstrated an understanding of the instructions. The patient was advised to call back or seek an in-person evaluation if the symptoms worsen or if the condition fails to improve as anticipated.  ? ?Total time spent: 20 minutes including face-to-face MyChart video visit time and time spent for planning, charting and coordination of care ? ?Rulon Eisenmenger, MD ?08/29/2021 ? ?I, Thana Ates am acting as scribe for Nicholas Lose, MD. ? ?I have reviewed the above documentation for accuracy and completeness, and I agree with the above. ? ? ?

## 2021-08-29 ENCOUNTER — Inpatient Hospital Stay: Payer: BC Managed Care – PPO | Attending: Hematology and Oncology | Admitting: Hematology and Oncology

## 2021-08-29 DIAGNOSIS — Z170 Estrogen receptor positive status [ER+]: Secondary | ICD-10-CM | POA: Diagnosis not present

## 2021-08-29 DIAGNOSIS — C50111 Malignant neoplasm of central portion of right female breast: Secondary | ICD-10-CM

## 2021-08-30 ENCOUNTER — Telehealth: Payer: Self-pay | Admitting: Hematology and Oncology

## 2021-08-30 NOTE — Telephone Encounter (Signed)
Scheduled appointment per 3/1 los. Talked with patient and is she aware. ?

## 2021-09-06 ENCOUNTER — Other Ambulatory Visit (HOSPITAL_COMMUNITY): Payer: Self-pay

## 2021-09-06 ENCOUNTER — Other Ambulatory Visit: Payer: Self-pay | Admitting: Hematology and Oncology

## 2021-09-06 MED ORDER — ABEMACICLIB 100 MG PO TABS
100.0000 mg | ORAL_TABLET | Freq: Two times a day (BID) | ORAL | 0 refills | Status: DC
  Filled 2021-09-13: qty 56, 28d supply, fill #0

## 2021-09-10 ENCOUNTER — Other Ambulatory Visit (HOSPITAL_COMMUNITY): Payer: Self-pay

## 2021-09-13 ENCOUNTER — Other Ambulatory Visit (HOSPITAL_COMMUNITY): Payer: Self-pay

## 2021-10-01 ENCOUNTER — Ambulatory Visit: Payer: BC Managed Care – PPO | Admitting: Nurse Practitioner

## 2021-10-09 ENCOUNTER — Other Ambulatory Visit (HOSPITAL_COMMUNITY): Payer: Self-pay

## 2021-10-09 ENCOUNTER — Other Ambulatory Visit: Payer: Self-pay | Admitting: Hematology and Oncology

## 2021-10-09 MED ORDER — ABEMACICLIB 100 MG PO TABS
100.0000 mg | ORAL_TABLET | Freq: Two times a day (BID) | ORAL | 3 refills | Status: DC
  Filled 2021-10-09: qty 56, 28d supply, fill #0
  Filled 2021-11-05: qty 56, 28d supply, fill #1
  Filled 2021-11-30: qty 56, 28d supply, fill #2
  Filled 2021-12-25: qty 56, 28d supply, fill #3

## 2021-10-09 NOTE — Telephone Encounter (Signed)
Hi John.  I know you have not seen this pt in the past but we are not able to refill oral chemo. Are you able to review and refill while Dr. Lindi Adie is out of the office.  Thanks! ?

## 2021-10-12 ENCOUNTER — Encounter: Payer: Self-pay | Admitting: Nurse Practitioner

## 2021-10-12 ENCOUNTER — Ambulatory Visit (INDEPENDENT_AMBULATORY_CARE_PROVIDER_SITE_OTHER): Payer: BC Managed Care – PPO | Admitting: Nurse Practitioner

## 2021-10-12 VITALS — BP 123/87 | HR 89 | Temp 98.6°F | Ht 65.0 in | Wt 389.0 lb

## 2021-10-12 DIAGNOSIS — I10 Essential (primary) hypertension: Secondary | ICD-10-CM

## 2021-10-12 DIAGNOSIS — Z1212 Encounter for screening for malignant neoplasm of rectum: Secondary | ICD-10-CM

## 2021-10-12 DIAGNOSIS — E781 Pure hyperglyceridemia: Secondary | ICD-10-CM

## 2021-10-12 DIAGNOSIS — Z1211 Encounter for screening for malignant neoplasm of colon: Secondary | ICD-10-CM

## 2021-10-12 DIAGNOSIS — K219 Gastro-esophageal reflux disease without esophagitis: Secondary | ICD-10-CM | POA: Diagnosis not present

## 2021-10-12 DIAGNOSIS — F419 Anxiety disorder, unspecified: Secondary | ICD-10-CM

## 2021-10-12 LAB — BAYER DCA HB A1C WAIVED: HB A1C (BAYER DCA - WAIVED): 5.1 % (ref 4.8–5.6)

## 2021-10-12 NOTE — Patient Instructions (Signed)
Hypertension, Adult ?Hypertension is another name for high blood pressure. High blood pressure forces your heart to work harder to pump blood. This can cause problems over time. ?There are two numbers in a blood pressure reading. There is a top number (systolic) over a bottom number (diastolic). It is best to have a blood pressure that is below 120/80. ?What are the causes? ?The cause of this condition is not known. Some other conditions can lead to high blood pressure. ?What increases the risk? ?Some lifestyle factors can make you more likely to develop high blood pressure: ?Smoking. ?Not getting enough exercise or physical activity. ?Being overweight. ?Having too much fat, sugar, calories, or salt (sodium) in your diet. ?Drinking too much alcohol. ?Other risk factors include: ?Having any of these conditions: ?Heart disease. ?Diabetes. ?High cholesterol. ?Kidney disease. ?Obstructive sleep apnea. ?Having a family history of high blood pressure and high cholesterol. ?Age. The risk increases with age. ?Stress. ?What are the signs or symptoms? ?High blood pressure may not cause symptoms. Very high blood pressure (hypertensive crisis) may cause: ?Headache. ?Fast or uneven heartbeats (palpitations). ?Shortness of breath. ?Nosebleed. ?Vomiting or feeling like you may vomit (nauseous). ?Changes in how you see. ?Very bad chest pain. ?Feeling dizzy. ?Seizures. ?How is this treated? ?This condition is treated by making healthy lifestyle changes, such as: ?Eating healthy foods. ?Exercising more. ?Drinking less alcohol. ?Your doctor may prescribe medicine if lifestyle changes do not help enough and if: ?Your top number is above 130. ?Your bottom number is above 80. ?Your personal target blood pressure may vary. ?Follow these instructions at home: ?Eating and drinking ? ?If told, follow the DASH eating plan. To follow this plan: ?Fill one half of your plate at each meal with fruits and vegetables. ?Fill one fourth of your plate  at each meal with whole grains. Whole grains include whole-wheat pasta, brown rice, and whole-grain bread. ?Eat or drink low-fat dairy products, such as skim milk or low-fat yogurt. ?Fill one fourth of your plate at each meal with low-fat (lean) proteins. Low-fat proteins include fish, chicken without skin, eggs, beans, and tofu. ?Avoid fatty meat, cured and processed meat, or chicken with skin. ?Avoid pre-made or processed food. ?Limit the amount of salt in your diet to less than 1,500 mg each day. ?Do not drink alcohol if: ?Your doctor tells you not to drink. ?You are pregnant, may be pregnant, or are planning to become pregnant. ?If you drink alcohol: ?Limit how much you have to: ?0-1 drink a day for women. ?0-2 drinks a day for men. ?Know how much alcohol is in your drink. In the [place] one drink equals one 12 oz bottle of beer (355 mL), one 5 oz glass of wine (148 mL), or one 1? oz glass of hard liquor (44 mL). ?Lifestyle ? ?Work with your doctor to stay at a healthy weight or to lose weight. Ask your doctor what the best weight is for you. ?Get at least 30 minutes of exercise that causes your heart to beat faster (aerobic exercise) most days of the week. This may include walking, swimming, or biking. ?Get at least 30 minutes of exercise that strengthens your muscles (resistance exercise) at least 3 days a week. This may include lifting weights or doing Pilates. ?Do not smoke or use any products that contain nicotine or tobacco. If you need help quitting, ask your doctor. ?Check your blood pressure at home as told by your doctor. ?Keep all follow-up visits. ?Medicines ?Take over-the-counter and prescription medicines   only as told by your doctor. Follow directions carefully. ?Do not skip doses of blood pressure medicine. The medicine does not work as well if you skip doses. Skipping doses also puts you at risk for problems. ?Ask your doctor about side effects or reactions to medicines that you should watch  for. ?Contact a doctor if: ?You think you are having a reaction to the medicine you are taking. ?You have headaches that keep coming back. ?You feel dizzy. ?You have swelling in your ankles. ?You have trouble with your vision. ?Get help right away if: ?You get a very bad headache. ?You start to feel mixed up (confused). ?You feel weak or numb. ?You feel faint. ?You have very bad pain in your: ?Chest. ?Belly (abdomen). ?You vomit more than once. ?You have trouble breathing. ?These symptoms may be an emergency. Get help right away. Call 911. ?Do not wait to see if the symptoms will go away. ?Do not drive yourself to the hospital. ?Summary ?Hypertension is another name for high blood pressure. ?High blood pressure forces your heart to work harder to pump blood. ?For most people, a normal blood pressure is less than 120/80. ?Making healthy choices can help lower blood pressure. If your blood pressure does not get lower with healthy choices, you may need to take medicine. ?This information is not intended to replace advice given to you by your health care provider. Make sure you discuss any questions you have with your health care provider. ?Document Revised: 04/05/2021 Document Reviewed: 04/05/2021 ?Elsevier Patient Education ? 2023 [employer] ? ?

## 2021-10-12 NOTE — Progress Notes (Signed)
? ?Established Patient Office Visit ? ?Subjective:  ?Patient ID: Shelby [name], female    DOB: 07/30/1970  Age: 51 y.o. MRN: [id number] ? ?CC:  ?Chief Complaint  ?Patient presents with  ? chronic disease management  ? ? ?HPI ?Teandra Harlan Mijangos presents for follow up of hypertension. Patient was diagnosed in 2017. The patient is tolerating the medication well without side effects. Compliance with treatment has been good; including taking medication as directed , maintains a healthy diet and regular exercise regimen as tolerated, and following up as directed.  ? ?GERD, Follow up: ? ?The patient was last seen for GERD 7 years ago. ?Changes made since that visit include; no changes made.. ? ?She reports good compliance with treatment. ?She is not having side effects. . ? ?She IS experiencing  no new symptoms . ?She is NOT experiencing belching and eructation, bilious reflux, choking on food, deep pressure at base of neck, dysphagia, or fullness after meals ?  ? ?  10/12/2021  ?  2:54 PM 08/21/2020  ?  9:17 AM 07/11/2020  ? 12:42 PM  ?GAD 7 : Generalized Anxiety Score  ?Nervous, Anxious, on Edge 0 0 2  ?Control/stop worrying 0 0 1  ?Worry too much - different things 0 2 1  ?Trouble relaxing 0 0 2  ?Restless 0 0 1  ?Easily annoyed or irritable 0 0 0  ?Afraid - awful might happen 0 0 0  ?Total GAD 7 Score 0 2 7  ?Anxiety Difficulty Not difficult at all Not difficult at all Not difficult at all  ? ?  ?Past Medical History:  ?Diagnosis Date  ? Cancer [facility])   ? right breast  ? Complication of anesthesia   ? hard to wake up  per pt  ? Dyspnea   ? GERD (gastroesophageal reflux disease)   ? Heart palpitations   ? Obesity   ? Personal history of chemotherapy   ? Personal history of radiation therapy   ? Pre-diabetes   ? ? ?Past Surgical History:  ?Procedure Laterality Date  ? BREAST LUMPECTOMY    ? BREAST LUMPECTOMY WITH RADIOACTIVE SEED AND SENTINEL LYMPH NODE BIOPSY Right 08/09/2020  ? Procedure: RIGHT BREAST LUMPECTOMY WITH RADIOACTIVE  SEED AND RIGHT BREAST RADIOACTIVE SEED TARGETED SENTINEL LYMPH NODE BIOPSY, RIGHT BREAST SENTINEL LYMPH NODE MAPPING;  Surgeon: Erroll Luna, MD;  Location: [facility];  Service: General;  Laterality: Right;  PEC BLOCK  ? BREAST SURGERY    ? CARDIAC CATHETERIZATION    ? CHOLECYSTECTOMY    ? DILATION AND CURETTAGE OF UTERUS    ? x1  ? HERNIA REPAIR    ? IR IMAGING GUIDED PORT INSERTION  08/29/2020  ? IR IMAGING GUIDED PORT INSERTION  08/29/2020  ? lumpectomy right breast    ? PORT-A-CATH REMOVAL N/A 04/05/2021  ? Procedure: PORT REMOVAL;  Surgeon: Erroll Luna, MD;  Location: [facility];  Service: General;  Laterality: N/A;  ? ? ?Family History  ?Adopted: Yes  ? ? ?Social History  ? ?Socioeconomic History  ? Marital status: Married  ?  Spouse name: Not on file  ? Number of children: Not on file  ? Years of education: Not on file  ? Highest education level: Not on file  ?Occupational History  ? Not on file  ?Tobacco Use  ? Smoking status: Never  ? Smokeless tobacco: Never  ?Vaping Use  ? Vaping Use: Never used  ?Substance and Sexual Activity  ? Alcohol use: No  ? Drug use:  No  ? Sexual activity: Yes  ?  Partners: Male  ?  Birth control/protection: Pill  ?Other Topics Concern  ? Not on file  ?Social History Narrative  ? Not on file  ? ?Social Determinants of Health  ? ?Financial Resource Strain: Low Risk   ? Difficulty of Paying Living Expenses: Not hard at all  ?Food Insecurity: No Food Insecurity  ? Worried About Charity fundraiser in the Last Year: Never true  ? Ran Out of Food in the Last Year: Never true  ?Transportation Needs: No Transportation Needs  ? Lack of Transportation (Medical): No  ? Lack of Transportation (Non-Medical): No  ?Physical Activity: Inactive  ? Days of Exercise per Week: 0 days  ? Minutes of Exercise per Session: 0 min  ?Stress: Stress Concern Present  ? Feeling of Stress : To some extent  ?Social Connections: Moderately Isolated  ? Frequency of Communication with Friends and Family: Three times  a week  ? Frequency of Social Gatherings with Friends and Family: Twice a week  ? Attends Religious Services: Never  ? Active Member of Clubs or Organizations: No  ? Attends Archivist Meetings: Never  ? Marital Status: Married  ?Intimate Partner Violence: Not At Risk  ? Fear of Current or Ex-Partner: No  ? Emotionally Abused: No  ? Physically Abused: No  ? Sexually Abused: No  ? ? ?Outpatient Medications Prior to Visit  ?Medication Sig Dispense Refill  ? abemaciclib (VERZENIO) 100 MG tablet Take 1 tablet (100 mg total) by mouth 2 (two) times daily. Swallow tablets whole. Do not chew, crush, or split tablets before swallowing. 56 tablet 3  ? cetirizine (ZYRTEC) 10 MG tablet Take 1 tablet (10 mg total) by mouth daily. 30 tablet 2  ? gabapentin (NEURONTIN) 100 MG capsule TAKE 1-3 CAPSULE BY MOUTH at bedtime. START WITH ONE NIGHTLY AND SLOWLY INCREASE TO THREE CAPSULE NIGHTLY AS TOLERATED 90 capsule 0  ? ibuprofen (ADVIL) 800 MG tablet Take 1 tablet (800 mg total) by mouth every 8 (eight) hours as needed. 30 tablet 0  ? letrozole (FEMARA) 2.5 MG tablet Take 1 tablet (2.5 mg total) by mouth daily. (Patient taking differently: Take 2.5 mg by mouth at bedtime.) 90 tablet 3  ? loratadine (CLARITIN) 10 MG tablet Take 1 tablet (10 mg total) by mouth daily. (Patient taking differently: Take 10 mg by mouth 2 (two) times daily. Takes 1 in the morning and 1 in the the evening) 30 tablet 11  ? metFORMIN (GLUCOPHAGE) 500 MG tablet Take 1 tablet (500 mg total) by mouth 2 (two) times daily with a meal. Needs office visit for further refills 60 tablet 0  ? metoprolol tartrate (LOPRESSOR) 50 MG tablet Take 1 tablet (50 mg total) by mouth daily. Needs office visit for further refills 30 tablet 0  ? mupirocin cream (BACTROBAN) 2 % Apply 1 application topically 2 (two) times daily. 15 g 2  ? mupirocin ointment (BACTROBAN) 2 % Apply 1 application. topically 2 (two) times daily.    ? neomycin-bacitracin-polymyxin (NEOSPORIN)  [phone] ointment Apply 1 application topically in the morning and at bedtime. 28.3 g 1  ? nitrofurantoin, macrocrystal-monohydrate, (MACROBID) 100 MG capsule Take 1 capsule (100 mg total) by mouth 2 (two) times daily. 1 po BId 14 capsule 0  ? nystatin (MYCOSTATIN/NYSTOP) powder Apply 1 application topically 2 (two) times daily. 15 g 4  ? pantoprazole (PROTONIX) 40 MG tablet Take 1 tablet (40 mg total) by mouth at bedtime.  Needs office visit for further refills 30 tablet 0  ? ?No facility-administered medications prior to visit.  ? ? ?Allergies  ?Allergen Reactions  ? Codeine Swelling  ? Peach Flavor Other (See Comments)  ?  Severe Migraines  ? ? ?ROS ?Review of Systems  ?Constitutional: Negative.   ?HENT: Negative.    ?Eyes: Negative.   ?Respiratory: Negative.    ?Cardiovascular: Negative.   ?Gastrointestinal: Negative.   ?Musculoskeletal: Negative.   ?Skin: Negative.   ?Psychiatric/Behavioral:  Negative for self-injury and sleep disturbance. The patient is not nervous/anxious.   ?All other systems reviewed and are negative. ? ?  ?Objective:  ?  ?Physical Exam ?Vitals and nursing note reviewed.  ?Constitutional:   ?   Appearance: She is obese.  ?HENT:  ?   Head: Normocephalic.  ?   Right Ear: External ear normal.  ?   Left Ear: External ear normal.  ?   Nose: Nose normal.  ?   Mouth/Throat:  ?   Mouth: Mucous membranes are moist.  ?   Pharynx: Oropharynx is clear.  ?Eyes:  ?   Conjunctiva/sclera: Conjunctivae normal.  ?Cardiovascular:  ?   Rate and Rhythm: Normal rate and regular rhythm.  ?   Pulses: Normal pulses.  ?   Heart sounds: Normal heart sounds.  ?Pulmonary:  ?   Effort: Pulmonary effort is normal.  ?   Breath sounds: Normal breath sounds.  ?Abdominal:  ?   General: Bowel sounds are normal.  ?Skin: ?   General: Skin is warm.  ?   Findings: No rash.  ?Neurological:  ?   General: No focal deficit present.  ?   Mental Status: She is alert and oriented to person, place, and time.  ?Psychiatric:     ?   Mood  and Affect: Mood normal.     ?   Behavior: Behavior normal.  ? ? ?BP 123/87   Pulse 89   Temp 98.6 ?F (37 ?C)   Ht '5\' 5"'$  (1.651 m)   Wt (!) 389 lb (176.4 kg)   LMP 05/13/2016   SpO2 97%   BMI 64.73 kg/m?

## 2021-10-13 LAB — LIPID PANEL
Chol/HDL Ratio: 3.4 ratio (ref 0.0–4.4)
Cholesterol, Total: 106 mg/dL (ref 100–199)
HDL: 31 mg/dL — ABNORMAL LOW (ref >39)
LDL Chol Calc (NIH): 40 mg/dL (ref 0–99)
Triglycerides: 218 mg/dL — ABNORMAL HIGH (ref 0–149)
VLDL Cholesterol Cal: 35 mg/dL (ref 5–40)

## 2021-10-13 NOTE — Assessment & Plan Note (Signed)
Blood pressure well controlled on current medication no changes necessary.  Labs completed-lipid panel; CBC and CMP completed at the cancer center in February. ?

## 2021-10-13 NOTE — Assessment & Plan Note (Signed)
Completed GAD-7.  Patient anxiety is well controlled no new or worsening symptoms. ?

## 2021-10-13 NOTE — Assessment & Plan Note (Signed)
Lipid panels completed results pending. ?

## 2021-10-13 NOTE — Assessment & Plan Note (Signed)
No new or worsening signs of GERD.  Symptoms are well controlled. ?

## 2021-10-15 ENCOUNTER — Other Ambulatory Visit (HOSPITAL_COMMUNITY): Payer: Self-pay

## 2021-10-16 NOTE — Progress Notes (Signed)
? ?Patient Care Team: ?[name], NP as PCP - General (Nurse Practitioner) ?Nicholas Lose, MD as Consulting Physician (Hematology and Oncology) ?Eppie Gibson, MD as Attending Physician (Radiation Oncology) ?Erroll Luna, MD as Consulting Physician (General Surgery) ? ?DIAGNOSIS:  ?Encounter Diagnosis  ?Name Primary?  ? Malignant neoplasm of central portion of right breast in female, estrogen receptor positive ([place])   ? ? ?SUMMARY OF ONCOLOGIC HISTORY: ?Oncology History  ?Malignant neoplasm of central portion of right breast in female, estrogen receptor positive ([place])  ?07/04/2020 Initial Diagnosis  ? Screening mammogram detected right breast mass and right axillary lymph nodes.  Right breast nipple retraction and retroareolar thickening.  2.4 cm mass with 2 abnormal lymph nodes.  Biopsy revealed invasive lobular cancer grade 1-2 ER 95%, PR 95%, Ki-67 30%, HER2 equivocal by IHC negative by FISH ratio 1.39 ?  ?07/17/2020 Cancer Staging  ? Staging form: Breast, AJCC 8th Edition ?- Clinical stage from 07/17/2020: Stage IIA (cT2, cN1, cM0, G2, ER+, PR+, HER2-) - Signed by Nicholas Lose, MD on 07/17/2020 ? ?  ?08/09/2020 Surgery  ? Rght lumpectomy ([facility]): invasive lobular carcinoma, 3.8cm, clear margins, 19/19 right axillary lymph nodes positive for metastatic carcinoma.  ?  ?08/09/2020 Cancer Staging  ? Staging form: Breast, AJCC 8th Edition ?- Pathologic stage from 08/09/2020: Stage IIIA (pT2, pN3a, cM0, G2, ER+, PR+, HER2-) - Signed by Gardenia Phlegm, NP on 08/23/2020 ?Stage prefix: Initial diagnosis ?Histologic grading system: 3 grade system ? ?  ?09/15/2020 - 11/24/2020 Chemotherapy  ? Adjuvant chemotherapy with TC Q21D x4 ? ?  ? ?  ?01/25/2021 - 03/07/2021 Radiation Therapy  ? Adjuvant radiation ?  ?03/2021 -  Anti-estrogen oral therapy  ? Letrozole daily ?  ? ? ?CHIEF COMPLIANT:  Follow-up of breast cancer on Verzenio ? ?INTERVAL HISTORY: Shelby [name] is a 51 y.o. with above-mentioned history of breast  cancer. Adjuvant chemotherapy with Taxotere and Cytoxan was stopped due to a right breast abscess currently on Verzenio. She presents to the clinic today for follow-up. She denies diarrhea. She have good days and bad days but still have some fatigue. Denies nausea. Complains of some neuropathy but its manageable. ? ?ALLERGIES:  is allergic to codeine and peach flavor. ? ?MEDICATIONS:  ?Current Outpatient Medications  ?Medication Sig Dispense Refill  ? abemaciclib (VERZENIO) 100 MG tablet Take 1 tablet (100 mg total) by mouth 2 (two) times daily. Swallow tablets whole. Do not chew, crush, or split tablets before swallowing. 56 tablet 3  ? gabapentin (NEURONTIN) 100 MG capsule TAKE 1 to 3 CAPSULEs BY MOUTH at bedtime. 90 capsule 0  ? ibuprofen (ADVIL) 800 MG tablet Take 1 tablet (800 mg total) by mouth every 8 (eight) hours as needed. 30 tablet 0  ? letrozole (FEMARA) 2.5 MG tablet Take 1 tablet (2.5 mg total) by mouth daily. (Patient taking differently: Take 2.5 mg by mouth at bedtime.) 90 tablet 3  ? metFORMIN (GLUCOPHAGE) 500 MG tablet Take 1 tablet (500 mg total) by mouth 2 (two) times daily with a meal. 60 tablet 2  ? metoprolol tartrate (LOPRESSOR) 50 MG tablet Take 1 tablet (50 mg total) by mouth daily. Needs office visit for further refills 30 tablet 0  ? mupirocin cream (BACTROBAN) 2 % Apply 1 application topically 2 (two) times daily. 15 g 2  ? mupirocin ointment (BACTROBAN) 2 % Apply 1 application. topically 2 (two) times daily.    ? neomycin-bacitracin-polymyxin (NEOSPORIN) [phone] ointment Apply 1 application topically in the morning and  at bedtime. 28.3 g 1  ? nystatin (MYCOSTATIN/NYSTOP) powder Apply 1 application topically 2 (two) times daily. 15 g 4  ? pantoprazole (PROTONIX) 40 MG tablet Take 1 tablet (40 mg total) by mouth at bedtime. Needs office visit for further refills 30 tablet 0  ? ?No current facility-administered medications for this visit.  ? ? ?PHYSICAL EXAMINATION: ?ECOG PERFORMANCE  STATUS: 1 - Symptomatic but completely ambulatory ? ?Vitals:  ? 10/30/21 0806  ?BP: (!) 155/80  ?Pulse: 91  ?Resp: 18  ?Temp: (!) 97.5 ?F (36.4 ?C)  ?SpO2: 98%  ? ?Filed Weights  ? 10/30/21 0806  ?Weight: (!) 397 lb 11.2 oz (180.4 kg)  ? ?  ? ?LABORATORY DATA:  ?I have reviewed the data as listed ? ?  Latest Ref Rng & Units 08/01/2021  ?  8:43 AM 07/17/2021  ?  9:34 AM 04/05/2021  ?  1:04 PM  ?CMP  ?Glucose 70 - 99 mg/dL 111   122   109    ?BUN 6 - 20 mg/dL 17   17   12     ?Creatinine 0.44 - 1.00 mg/dL 0.89   0.84   0.69    ?Sodium 135 - 145 mmol/L 141   141   138    ?Potassium 3.5 - 5.1 mmol/L 4.0   4.3   4.4    ?Chloride 98 - 111 mmol/L 107   104   105    ?CO2 22 - 32 mmol/L 23   26   22     ?Calcium 8.9 - 10.3 mg/dL 9.7   9.2   9.1    ?Total Protein 6.5 - 8.1 g/dL 7.6   6.9     ?Total Bilirubin 0.3 - 1.2 mg/dL 0.4   0.4     ?Alkaline Phos 38 - 126 U/L 92   87     ?AST 15 - 41 U/L 16   10     ?ALT 0 - 44 U/L 19   14     ? ? ?Lab Results  ?Component Value Date  ? WBC 4.5 10/30/2021  ? HGB 13.0 10/30/2021  ? HCT 37.6 10/30/2021  ? MCV 90.6 10/30/2021  ? PLT 195 10/30/2021  ? NEUTROABS 2.7 10/30/2021  ? ? ?ASSESSMENT & PLAN:  ?Malignant neoplasm of central portion of right breast in female, estrogen receptor positive ([place]) ?07/04/2020:Screening mammogram detected right breast mass and right axillary lymph nodes.  Right breast nipple retraction and retroareolar thickening.  2.4 cm mass with 2 abnormal lymph nodes.  Biopsy revealed invasive lobular cancer grade 1-2 ER 95%, PR 95%, Ki-67 30%, HER2 equivocal by IHC negative by FISH ratio 1.39 ?T2N1 stage IIA ?  ?Right lumpectomy: 08/09/2020: ILC 3.8 cm, 19/19 LN Positive, Margins Neg ?  ?Plan: ?1. Staging scans ?2. Adjuvant chemo with TC X 6 (because of her propensity for diabetes she may only end up getting 4) ?3. XRT 01/25/2021- 03/07/21 ?4. Adj Anti estrogen therapy (with  Verzenio) ?------------------------------------------------------------------------------------------------------------------------------------ ?08/30/2020: CT CAP: No evidence of metastatic disease ?08/30/2020: Bone scan: No evidence of bone metastases ?-------------------------------------------------------------------------------------------------------------------- ? Anastrozole toxicities: Intermittent hot flashes ?Abemaciclib Toxicities: Denies any side effects, current dosage: 100 mg twice a day (started December 2022) ?Very occasional diarrhea ?We will keep the current doses the same. ?  ?Breast Cancer Surveillance ?1. Breast Exam: 07/16/21 ?2. CT chest 07/19/21: Radiation fibrosis, Resolution of Rt Axillary LN, Hep steatosis ?3. Mammogram 07/12/21: Benign Density Cat B ?  ?She is working hard to lose weight.   ?  She is going to [place] on a holiday in June ?Return to clinic in 3 months with labs and follow-up ? ? ? ?Orders Placed This Encounter  ?Procedures  ? CBC with Differential ([facility] Only)  ?  Standing Status:   Future  ?  Standing Expiration Date:   10/31/2022  ? CMP ([facility] only)  ?  Standing Status:   Future  ?  Standing Expiration Date:   10/31/2022  ? ?The patient has a good understanding of the overall plan. she agrees with it. she will call with any problems that may develop before the next visit here. ?Total time spent: 30 mins including face to face time and time spent for planning, charting and co-ordination of care ? ? Harriette Ohara, MD ?10/30/21 ? ? ? I Gardiner Coins am scribing for Dr. Lindi Adie ? ?I have reviewed the above documentation for accuracy and completeness, and I agree with the above. ? ?

## 2021-10-22 ENCOUNTER — Other Ambulatory Visit: Payer: Self-pay | Admitting: Nurse Practitioner

## 2021-10-22 ENCOUNTER — Other Ambulatory Visit: Payer: Self-pay | Admitting: Adult Health

## 2021-10-22 ENCOUNTER — Other Ambulatory Visit: Payer: Self-pay | Admitting: Hematology and Oncology

## 2021-10-22 DIAGNOSIS — R7303 Prediabetes: Secondary | ICD-10-CM

## 2021-10-22 DIAGNOSIS — E2839 Other primary ovarian failure: Secondary | ICD-10-CM

## 2021-10-22 DIAGNOSIS — Z170 Estrogen receptor positive status [ER+]: Secondary | ICD-10-CM

## 2021-10-22 NOTE — Telephone Encounter (Signed)
OV 10/12/21 no Dx, A1C done ?

## 2021-10-24 ENCOUNTER — Ambulatory Visit
Admission: RE | Admit: 2021-10-24 | Discharge: 2021-10-24 | Disposition: A | Discharge status: Home / Self Care | Payer: BC Managed Care – PPO | Source: Ambulatory Visit | Attending: Adult Health | Admitting: Adult Health

## 2021-10-24 ENCOUNTER — Other Ambulatory Visit: Payer: BC Managed Care – PPO

## 2021-10-24 DIAGNOSIS — Z780 Asymptomatic menopausal state: Secondary | ICD-10-CM | POA: Diagnosis not present

## 2021-10-24 DIAGNOSIS — E2839 Other primary ovarian failure: Secondary | ICD-10-CM

## 2021-10-24 DIAGNOSIS — Z1212 Encounter for screening for malignant neoplasm of rectum: Secondary | ICD-10-CM | POA: Diagnosis not present

## 2021-10-24 DIAGNOSIS — Z1211 Encounter for screening for malignant neoplasm of colon: Secondary | ICD-10-CM | POA: Diagnosis not present

## 2021-10-25 ENCOUNTER — Telehealth: Payer: Self-pay

## 2021-10-25 NOTE — Telephone Encounter (Signed)
-----   Message from Gardenia Phlegm, NP sent at 10/25/2021  7:54 AM EDT ----- ?Regarding: FW: ?Please inform patient of normal results ?----- Message ----- ?From: Interface, Rad Results In ?Sent: 10/24/2021   8:30 AM EDT ?To: Gardenia Phlegm, NP ? ?

## 2021-10-25 NOTE — Telephone Encounter (Signed)
Called pt to give results per NP. Pt verbalized happiness and understanding.  ?

## 2021-10-30 ENCOUNTER — Other Ambulatory Visit: Payer: Self-pay

## 2021-10-30 ENCOUNTER — Inpatient Hospital Stay: Payer: BC Managed Care – PPO | Attending: Hematology and Oncology

## 2021-10-30 ENCOUNTER — Inpatient Hospital Stay (HOSPITAL_BASED_OUTPATIENT_CLINIC_OR_DEPARTMENT_OTHER): Payer: BC Managed Care – PPO | Admitting: Hematology and Oncology

## 2021-10-30 DIAGNOSIS — Z170 Estrogen receptor positive status [ER+]: Secondary | ICD-10-CM

## 2021-10-30 DIAGNOSIS — C50111 Malignant neoplasm of central portion of right female breast: Secondary | ICD-10-CM

## 2021-10-30 DIAGNOSIS — C773 Secondary and unspecified malignant neoplasm of axilla and upper limb lymph nodes: Secondary | ICD-10-CM | POA: Insufficient documentation

## 2021-10-30 LAB — CMP (CANCER CENTER ONLY)
ALT: 22 U/L (ref 0–44)
AST: 16 U/L (ref 15–41)
Albumin: 3.8 g/dL (ref 3.5–5.0)
Alkaline Phosphatase: 76 U/L (ref 38–126)
Anion gap: 7 (ref 5–15)
BUN: 14 mg/dL (ref 6–20)
CO2: 27 mmol/L (ref 22–32)
Calcium: 9.2 mg/dL (ref 8.9–10.3)
Chloride: 108 mmol/L (ref 98–111)
Creatinine: 0.9 mg/dL (ref 0.44–1.00)
GFR, Estimated: >60 mL/min (ref >60)
Glucose, Bld: 121 mg/dL — ABNORMAL HIGH (ref 70–99)
Potassium: 3.8 mmol/L (ref 3.5–5.1)
Sodium: 142 mmol/L (ref 135–145)
Total Bilirubin: 0.3 mg/dL (ref 0.3–1.2)
Total Protein: 6.6 g/dL (ref 6.5–8.1)

## 2021-10-30 LAB — CBC WITH DIFFERENTIAL (CANCER CENTER ONLY)
Abs Immature Granulocytes: 0.01 10*3/uL (ref 0.00–0.07)
Basophils Absolute: 0 10*3/uL (ref 0.0–0.1)
Basophils Relative: 1 %
Eosinophils Absolute: 0.1 10*3/uL (ref 0.0–0.5)
Eosinophils Relative: 1 %
HCT: 37.6 % (ref 36.0–46.0)
Hemoglobin: 13 g/dL (ref 12.0–15.0)
Immature Granulocytes: 0 %
Lymphocytes Relative: 31 %
Lymphs Abs: 1.4 10*3/uL (ref 0.7–4.0)
MCH: 31.3 pg (ref 26.0–34.0)
MCHC: 34.6 g/dL (ref 30.0–36.0)
MCV: 90.6 fL (ref 80.0–100.0)
Monocytes Absolute: 0.3 10*3/uL (ref 0.1–1.0)
Monocytes Relative: 7 %
Neutro Abs: 2.7 10*3/uL (ref 1.7–7.7)
Neutrophils Relative %: 60 %
Platelet Count: 195 10*3/uL (ref 150–400)
RBC: 4.15 MIL/uL (ref 3.87–5.11)
RDW: 14.4 % (ref 11.5–15.5)
WBC Count: 4.5 10*3/uL (ref 4.0–10.5)
nRBC: 0 % (ref 0.0–0.2)

## 2021-10-30 NOTE — Assessment & Plan Note (Signed)
07/04/2020:Screening mammogram detected right breast mass and right axillary lymph nodes. ?Right breast nipple retraction and retroareolar thickening. ?2.4 cm mass with 2 abnormal lymph nodes. ?Biopsy revealed invasive lobular cancer grade 1-2 ER 95%, PR 95%, Ki-67 30%, HER2 equivocal by IHC negative by FISH ratio 1.39 ?T2N1 stage?IIA ?? ?Right lumpectomy: 08/09/2020:?ILC 3.8 cm, 19/19 LN Positive, Margins Neg ?? ?Plan: ?1. Staging scans ?2. Adjuvant chemo with TC X 6?(because of her propensity for diabetes she may only end up getting 4) ?3. XRT 01/25/2021- 03/07/21 ?4. Adj Anti estrogen therapy?(with Verzenio) ?------------------------------------------------------------------------------------------------------------------------------------ ?08/30/2020: CT CAP:?No evidence of metastatic disease ?08/30/2020: Bone scan:?No evidence of bone metastases ?-------------------------------------------------------------------------------------------------------------------- ??Anastrozole toxicities:?Intermittent hot flashes ?Abemaciclib?Toxicities:?Denies any side effects,?current dosage: 100 mg twice a day (started December 2022) ?Very occasional diarrhea ?We will keep the current doses the same. ?? ?Breast Cancer Surveillance ?1. Breast Exam: 07/16/21 ?2.?CT chest?07/19/21: Radiation fibrosis, Resolution of Rt Axillary LN, Hep steatosis ?3. Mammogram 07/12/21: Benign Density Cat B ?? ?She is working hard to lose weight. ? ?Return to clinic in 3 months with labs and follow-up ?

## 2021-10-31 ENCOUNTER — Encounter: Payer: Self-pay | Admitting: Family Medicine

## 2021-10-31 ENCOUNTER — Telehealth: Payer: Self-pay | Admitting: Hematology and Oncology

## 2021-10-31 ENCOUNTER — Ambulatory Visit (INDEPENDENT_AMBULATORY_CARE_PROVIDER_SITE_OTHER): Payer: BC Managed Care – PPO | Admitting: Family Medicine

## 2021-10-31 DIAGNOSIS — R3989 Other symptoms and signs involving the genitourinary system: Secondary | ICD-10-CM | POA: Diagnosis not present

## 2021-10-31 MED ORDER — FLUCONAZOLE 150 MG PO TABS: 150.0000 mg | ORAL_TABLET | Freq: Once | ORAL | 0 refills | Status: AC

## 2021-10-31 MED ORDER — NITROFURANTOIN MONOHYD MACRO 100 MG PO CAPS: 100.0000 mg | ORAL_CAPSULE | Freq: Two times a day (BID) | ORAL | 0 refills | Status: AC

## 2021-10-31 NOTE — Progress Notes (Signed)
? ?Virtual Visit via Telephone Note ? ?I connected with Shelby [name] on 10/31/21 at 10:27 AM by telephone and verified that I am speaking with the correct person using two identifiers. Shelby [name] is currently located at home and nobody is currently with her during this visit. The provider, Loman Brooklyn, FNP is located in their office at time of visit. ? ?I discussed the limitations, risks, security and privacy concerns of performing an evaluation and management service by telephone and the availability of in person appointments. I also discussed with the patient that there may be a patient responsible charge related to this service. The patient expressed understanding and agreed to proceed. ? ?Subjective: ?PCP: [name], NP ? ?Chief Complaint  ?Patient presents with  ? Urinary Tract Infection  ? ?Patient complains of dysuria and frequency. She has had symptoms for 1 day. Patient denies back pain and fever. Patient does have a history of recurrent UTI.  Patient does not have a history of pyelonephritis.  ? ? ?ROS: Per HPI ? ?Current Outpatient Medications:  ?  abemaciclib (VERZENIO) 100 MG tablet, Take 1 tablet (100 mg total) by mouth 2 (two) times daily. Swallow tablets whole. Do not chew, crush, or split tablets before swallowing., Disp: 56 tablet, Rfl: 3 ?  gabapentin (NEURONTIN) 100 MG capsule, TAKE 1 to 3 CAPSULEs BY MOUTH at bedtime., Disp: 90 capsule, Rfl: 0 ?  ibuprofen (ADVIL) 800 MG tablet, Take 1 tablet (800 mg total) by mouth every 8 (eight) hours as needed., Disp: 30 tablet, Rfl: 0 ?  letrozole (FEMARA) 2.5 MG tablet, Take 1 tablet (2.5 mg total) by mouth daily. (Patient taking differently: Take 2.5 mg by mouth at bedtime.), Disp: 90 tablet, Rfl: 3 ?  metFORMIN (GLUCOPHAGE) 500 MG tablet, Take 1 tablet (500 mg total) by mouth 2 (two) times daily with a meal., Disp: 60 tablet, Rfl: 2 ?  metoprolol tartrate (LOPRESSOR) 50 MG tablet, Take 1 tablet (50 mg total) by mouth daily. Needs office  visit for further refills, Disp: 30 tablet, Rfl: 0 ?  mupirocin cream (BACTROBAN) 2 %, Apply 1 application topically 2 (two) times daily., Disp: 15 g, Rfl: 2 ?  mupirocin ointment (BACTROBAN) 2 %, Apply 1 application. topically 2 (two) times daily., Disp: , Rfl:  ?  neomycin-bacitracin-polymyxin (NEOSPORIN) [phone] ointment, Apply 1 application topically in the morning and at bedtime., Disp: 28.3 g, Rfl: 1 ?  nystatin (MYCOSTATIN/NYSTOP) powder, Apply 1 application topically 2 (two) times daily., Disp: 15 g, Rfl: 4 ?  pantoprazole (PROTONIX) 40 MG tablet, Take 1 tablet (40 mg total) by mouth at bedtime. Needs office visit for further refills, Disp: 30 tablet, Rfl: 0 ? ?Allergies  ?Allergen Reactions  ? Codeine Swelling  ? Peach Flavor Other (See Comments)  ?  Severe Migraines  ? ?Past Medical History:  ?Diagnosis Date  ? Cancer [facility])   ? right breast  ? Complication of anesthesia   ? hard to wake up  per pt  ? Dyspnea   ? GERD (gastroesophageal reflux disease)   ? Heart palpitations   ? Obesity   ? Personal history of chemotherapy   ? Personal history of radiation therapy   ? Pre-diabetes   ? ? ?Observations/Objective: ?A&O  ?No respiratory distress or wheezing audible over the phone ?Mood, judgement, and thought processes all WNL ? ?Assessment and Plan: ?1. Suspected UTI ?Education provided on UTIs. Encouraged adequate hydration.  ?- nitrofurantoin, macrocrystal-monohydrate, (MACROBID) 100 MG capsule; Take 1 capsule (  100 mg total) by mouth 2 (two) times daily for 5 days.  Dispense: 10 capsule; Refill: 0 ?- fluconazole (DIFLUCAN) 150 MG tablet; Take 1 tablet (150 mg total) by mouth once for 1 dose. May repeat after 3 days if needed.  Dispense: 2 tablet; Refill: 0 ? ? ?Follow Up Instructions: ?I discussed the assessment and treatment plan with the patient. The patient was provided an opportunity to ask questions and all were answered. The patient agreed with the plan and demonstrated an understanding of the  instructions. ?  ?The patient was advised to call back or seek an in-person evaluation if the symptoms worsen or if the condition fails to improve as anticipated. ? ?The above assessment and management plan was discussed with the patient. The patient verbalized understanding of and has agreed to the management plan. Patient is aware to call the clinic if symptoms persist or worsen. Patient is aware when to return to the clinic for a follow-up visit. Patient educated on when it is appropriate to go to the emergency department.  ? ?Time call ended: 10:38 AM ? ?I provided 11 minutes of non-face-to-face time during this encounter. ? ?Hendricks Limes, MSN, APRN, FNP-C ?[place] ?10/31/21 ?

## 2021-10-31 NOTE — Telephone Encounter (Signed)
Per 5/2 los called and spoke to pt about lab and md appointment.  Pt confirmed appointment  ?

## 2021-11-04 LAB — COLOGUARD: COLOGUARD: NEGATIVE

## 2021-11-05 ENCOUNTER — Other Ambulatory Visit (HOSPITAL_COMMUNITY): Payer: Self-pay

## 2021-11-06 ENCOUNTER — Encounter (HOSPITAL_COMMUNITY): Payer: Self-pay

## 2021-11-13 ENCOUNTER — Other Ambulatory Visit (HOSPITAL_COMMUNITY): Payer: Self-pay

## 2021-11-23 ENCOUNTER — Encounter: Payer: Self-pay | Admitting: Family Medicine

## 2021-11-23 ENCOUNTER — Ambulatory Visit (INDEPENDENT_AMBULATORY_CARE_PROVIDER_SITE_OTHER): Payer: BC Managed Care – PPO | Admitting: Family Medicine

## 2021-11-23 DIAGNOSIS — J0110 Acute frontal sinusitis, unspecified: Secondary | ICD-10-CM | POA: Diagnosis not present

## 2021-11-23 DIAGNOSIS — J309 Allergic rhinitis, unspecified: Secondary | ICD-10-CM | POA: Diagnosis not present

## 2021-11-23 DIAGNOSIS — B379 Candidiasis, unspecified: Secondary | ICD-10-CM | POA: Diagnosis not present

## 2021-11-23 MED ORDER — AMOXICILLIN-POT CLAVULANATE 875-125 MG PO TABS: 1.0000 | ORAL_TABLET | Freq: Two times a day (BID) | ORAL | 0 refills | Status: AC

## 2021-11-23 MED ORDER — LEVOCETIRIZINE DIHYDROCHLORIDE 5 MG PO TABS: 5.0000 mg | ORAL_TABLET | Freq: Every evening | ORAL | 1 refills | Status: DC

## 2021-11-23 MED ORDER — FLUCONAZOLE 150 MG PO TABS: ORAL_TABLET | ORAL | 0 refills | Status: DC

## 2021-11-23 MED ORDER — FLUTICASONE PROPIONATE 50 MCG/ACT NA SUSP: 2.0000 | Freq: Every day | NASAL | 6 refills | Status: DC

## 2021-11-23 NOTE — Progress Notes (Signed)
   Virtual Visit  Note Due to COVID-19 pandemic this visit was conducted virtually. This visit type was conducted due to national recommendations for restrictions regarding the COVID-19 Pandemic (e.g. social distancing, sheltering in place) in an effort to limit this patient's exposure and mitigate transmission in our community. All issues noted in this document were discussed and addressed.  A physical exam was not performed with this format.  I connected with Shelby [name] on 11/23/21 at 1350 by telephone and verified that I am speaking with the correct person using two identifiers. Shelby [name] is currently located at home and no one is currently with him during the visit. The provider, Gwenlyn Perking, FNP is located in their office at time of visit.  I discussed the limitations, risks, security and privacy concerns of performing an evaluation and management service by telephone and the availability of in person appointments. I also discussed with the patient that there may be a patient responsible charge related to this service. The patient expressed understanding and agreed to proceed.  CC: sinusitis  History and Present Illness:  HPI Shelby [name] reports chronic allergic rhinitis. She has been taking an antihistamine without improvement. She reports increased congestion with pressure around her temples and ears that started a few days ago and is getting worse. She denies cough, congestion, sore throat, body aches, chills, nausea, vomiting, or diarrhea. She has had a negative Covid test.     ROS As per HPI.   Observations/Objective: Alert and oriented x 3. Able to speak in full sentences without difficulty.   Assessment and Plan: Diagnoses and all orders for this visit:  Chronic allergic rhinitis Stop antihistamine and take xyzal daily instead. Also use flonase daily.  -     levocetirizine (XYZAL) 5 MG tablet; Take 1 tablet (5 mg total) by mouth every evening. -     fluticasone (FLONASE)  50 MCG/ACT nasal spray; Place 2 sprays into both nostrils daily.  Acute non-recurrent frontal sinusitis Augmentin as below. Diflucan for abx induced yeast.  -     amoxicillin-clavulanate (AUGMENTIN) 875-125 MG tablet; Take 1 tablet by mouth 2 (two) times daily for 10 days. -     fluconazole (DIFLUCAN) 150 MG tablet; Take one tablet by mouth now. May repeat in 3 days if needed.     Follow Up Instructions: Return to office for new or worsening symptoms, or if symptoms persist.      I discussed the assessment and treatment plan with the patient. The patient was provided an opportunity to ask questions and all were answered. The patient agreed with the plan and demonstrated an understanding of the instructions.   The patient was advised to call back or seek an in-person evaluation if the symptoms worsen or if the condition fails to improve as anticipated.  The above assessment and management plan was discussed with the patient. The patient verbalized understanding of and has agreed to the management plan. Patient is aware to call the clinic if symptoms persist or worsen. Patient is aware when to return to the clinic for a follow-up visit. Patient educated on when it is appropriate to go to the emergency department.   Time call ended:  1402  I provided 12 minutes of  non face-to-face time during this encounter.    Gwenlyn Perking, FNP

## 2021-11-27 ENCOUNTER — Other Ambulatory Visit: Payer: Self-pay | Admitting: Hematology and Oncology

## 2021-11-27 DIAGNOSIS — C50111 Malignant neoplasm of central portion of right female breast: Secondary | ICD-10-CM

## 2021-11-30 ENCOUNTER — Other Ambulatory Visit (HOSPITAL_COMMUNITY): Payer: Self-pay

## 2021-12-04 ENCOUNTER — Other Ambulatory Visit (HOSPITAL_COMMUNITY): Payer: Self-pay

## 2021-12-25 ENCOUNTER — Other Ambulatory Visit: Payer: Self-pay | Admitting: Nurse Practitioner

## 2021-12-25 ENCOUNTER — Other Ambulatory Visit (HOSPITAL_COMMUNITY): Payer: Self-pay

## 2021-12-25 DIAGNOSIS — K219 Gastro-esophageal reflux disease without esophagitis: Secondary | ICD-10-CM

## 2021-12-25 DIAGNOSIS — R7303 Prediabetes: Secondary | ICD-10-CM

## 2022-01-03 ENCOUNTER — Other Ambulatory Visit (HOSPITAL_COMMUNITY): Payer: Self-pay

## 2022-01-03 ENCOUNTER — Ambulatory Visit (INDEPENDENT_AMBULATORY_CARE_PROVIDER_SITE_OTHER): Payer: BC Managed Care – PPO | Admitting: Family Medicine

## 2022-01-03 ENCOUNTER — Encounter: Payer: Self-pay | Admitting: Family Medicine

## 2022-01-03 DIAGNOSIS — J309 Allergic rhinitis, unspecified: Secondary | ICD-10-CM

## 2022-01-03 DIAGNOSIS — J0100 Acute maxillary sinusitis, unspecified: Secondary | ICD-10-CM

## 2022-01-03 DIAGNOSIS — T3695XA Adverse effect of unspecified systemic antibiotic, initial encounter: Secondary | ICD-10-CM | POA: Diagnosis not present

## 2022-01-03 DIAGNOSIS — B379 Candidiasis, unspecified: Secondary | ICD-10-CM

## 2022-01-03 MED ORDER — FLUCONAZOLE 150 MG PO TABS: ORAL_TABLET | ORAL | 0 refills | Status: DC

## 2022-01-03 MED ORDER — MONTELUKAST SODIUM 10 MG PO TABS
10.0000 mg | ORAL_TABLET | Freq: Every day | ORAL | 3 refills | Status: DC
Start: 2022-01-03 — End: 2022-04-02

## 2022-01-03 MED ORDER — CEFDINIR 300 MG PO CAPS: 300.0000 mg | ORAL_CAPSULE | Freq: Two times a day (BID) | ORAL | 0 refills | Status: AC

## 2022-01-03 MED ORDER — CETIRIZINE HCL 10 MG PO TABS: 10.0000 mg | ORAL_TABLET | Freq: Every day | ORAL | 11 refills | Status: DC

## 2022-01-03 NOTE — Progress Notes (Signed)
Virtual Visit  Note Due to COVID-19 pandemic this visit was conducted virtually. This visit type was conducted due to national recommendations for restrictions regarding the COVID-19 Pandemic (e.g. social distancing, sheltering in place) in an effort to limit this patient's exposure and mitigate transmission in our community. All issues noted in this document were discussed and addressed.  A physical exam was not performed with this format.  I connected with Shelby [name] on 01/03/22 at 1320 by telephone and verified that I am speaking with the correct person using two identifiers. Shelby [name] is currently located at home and no one is currently with her during the visit. The provider, Gwenlyn Perking, FNP is located in their office at time of visit.  I discussed the limitations, risks, security and privacy concerns of performing an evaluation and management service by telephone and the availability of in person appointments. I also discussed with the patient that there may be a patient responsible charge related to this service. The patient expressed understanding and agreed to proceed.  CC: sinusitis  History and Present Illness:  HPI Cashlyn reports that her seasonal allergies have been uncontrolled. She has been taking xyzal daily without improvement for the last few months. She previously was no zyrtec and singular with good control. She reports chronic nasal congestion. For the last few days she reports increased congestin with postnasal drip, facial pressure and tenderness of her maxillary sinuses. She denies fever, sore throat, shortness of breath, chest pain, chills, myalgias, nausea, or vomiting.   ROS As per HPI.   Observations/Objective: Alert and oriented x 3. Able to speak in full sentences without difficulty.    Assessment and Plan: Aamilah was seen today for sinusitis.  Diagnoses and all orders for this visit:  Acute non-recurrent maxillary sinusitis Omnicef as below, was  recently on augmentin.  -     cefdinir (OMNICEF) 300 MG capsule; Take 1 capsule (300 mg total) by mouth 2 (two) times daily for 5 days. 1 po BID  Chronic allergic rhinitis Uncontrolled. Will switched back to previous regimen.  -     cetirizine (ZYRTEC) 10 MG tablet; Take 1 tablet (10 mg total) by mouth daily. -     montelukast (SINGULAIR) 10 MG tablet; Take 1 tablet (10 mg total) by mouth at bedtime.  Antibiotic-induced yeast infection -     fluconazole (DIFLUCAN) 150 MG tablet; Take one tablet by mouth now. May repeat in 3 days if needed.     Follow Up Instructions: Return to office for new or worsening symptoms, or if symptoms persist.     I discussed the assessment and treatment plan with the patient. The patient was provided an opportunity to ask questions and all were answered. The patient agreed with the plan and demonstrated an understanding of the instructions.   The patient was advised to call back or seek an in-person evaluation if the symptoms worsen or if the condition fails to improve as anticipated.  The above assessment and management plan was discussed with the patient. The patient verbalized understanding of and has agreed to the management plan. Patient is aware to call the clinic if symptoms persist or worsen. Patient is aware when to return to the clinic for a follow-up visit. Patient educated on when it is appropriate to go to the emergency department.   Time call ended:  1331  I provided 11 minutes of  non face-to-face time during this encounter.    Gwenlyn Perking, FNP

## 2022-01-15 ENCOUNTER — Encounter: Payer: Self-pay | Admitting: Nurse Practitioner

## 2022-01-15 ENCOUNTER — Ambulatory Visit (INDEPENDENT_AMBULATORY_CARE_PROVIDER_SITE_OTHER): Payer: BC Managed Care – PPO | Admitting: Nurse Practitioner

## 2022-01-15 DIAGNOSIS — J029 Acute pharyngitis, unspecified: Secondary | ICD-10-CM | POA: Diagnosis not present

## 2022-01-15 DIAGNOSIS — H1033 Unspecified acute conjunctivitis, bilateral: Secondary | ICD-10-CM

## 2022-01-15 DIAGNOSIS — R051 Acute cough: Secondary | ICD-10-CM | POA: Diagnosis not present

## 2022-01-15 MED ORDER — PSEUDOEPH-BROMPHEN-DM 30-2-10 MG/5ML PO SYRP: 5.0000 mL | ORAL_SOLUTION | Freq: Four times a day (QID) | ORAL | 0 refills | Status: DC | PRN

## 2022-01-15 MED ORDER — BACITRACIN-POLYMYXIN B 500-10000 UNIT/GM OP OINT: 1.0000 | TOPICAL_OINTMENT | Freq: Two times a day (BID) | OPHTHALMIC | 1 refills | Status: DC

## 2022-01-15 MED ORDER — SEMAGLUTIDE-WEIGHT MANAGEMENT 0.25 MG/0.5ML ~~LOC~~ SOAJ: 0.2500 mg | SUBCUTANEOUS | 0 refills | Status: DC

## 2022-01-15 NOTE — Progress Notes (Signed)
   Virtual Visit  Note Due to COVID-19 pandemic this visit was conducted virtually. This visit type was conducted due to national recommendations for restrictions regarding the COVID-19 Pandemic (e.g. social distancing, sheltering in place) in an effort to limit this patient's exposure and mitigate transmission in our community. All issues noted in this document were discussed and addressed.  A physical exam was not performed with this format.  I connected with Shelby [name] on 01/15/22 at 3:30 pm  by telephone and verified that I am speaking with the correct person using two identifiers. Shelby [name] is currently located at home during visit. The provider, [name], NP is located in their office at time of visit.  I discussed the limitations, risks, security and privacy concerns of performing an evaluation and management service by telephone and the availability of in person appointments. I also discussed with the patient that there may be a patient responsible charge related to this service. The patient expressed understanding and agreed to proceed.   History and Present Illness:  Cough This is a new problem. The problem has been unchanged. The cough is Productive of sputum. Associated symptoms include nasal congestion and a sore throat. Pertinent negatives include no chest pain, chills, ear congestion, heartburn or rash. There is no history of asthma.      Review of Systems  Constitutional: Negative.  Negative for chills.  HENT:  Positive for sore throat.   Eyes: Negative.   Respiratory:  Positive for cough.   Cardiovascular:  Negative for chest pain.  Gastrointestinal:  Negative for heartburn.  Genitourinary: Negative.   Skin: Negative.  Negative for rash.     Observations/Objective: Tele-visit patient is not in distress  Assessment and Plan: Take meds as prescribed - Use a cool mist humidifier  -Use saline nose sprays frequently -Force fluids -For fever or aches or  pains- take Tylenol or ibuprofen. -Bromfed for cough and congestion -Polysporin eye drops  -If symptoms do not improve, she may need to be COVID tested to rule this out   Follow Up Instructions: Follow up with worsening unresolved symptoms    I discussed the assessment and treatment plan with the patient. The patient was provided an opportunity to ask questions and all were answered. The patient agreed with the plan and demonstrated an understanding of the instructions.   The patient was advised to call back or seek an in-person evaluation if the symptoms worsen or if the condition fails to improve as anticipated.  The above assessment and management plan was discussed with the patient. The patient verbalized understanding of and has agreed to the management plan. Patient is aware to call the clinic if symptoms persist or worsen. Patient is aware when to return to the clinic for a follow-up visit. Patient educated on when it is appropriate to go to the emergency department.   Time call ended: 3:43 pm   I provided 13  minutes of  non face-to-face time during this encounter.    [name], NP

## 2022-01-21 DIAGNOSIS — R051 Acute cough: Secondary | ICD-10-CM | POA: Diagnosis not present

## 2022-01-21 DIAGNOSIS — Z6841 Body Mass Index (BMI) 40.0 and over, adult: Secondary | ICD-10-CM | POA: Diagnosis not present

## 2022-01-21 DIAGNOSIS — J00 Acute nasopharyngitis [common cold]: Secondary | ICD-10-CM | POA: Diagnosis not present

## 2022-01-21 DIAGNOSIS — H6981 Other specified disorders of Eustachian tube, right ear: Secondary | ICD-10-CM | POA: Diagnosis not present

## 2022-01-25 ENCOUNTER — Other Ambulatory Visit: Payer: Self-pay | Admitting: Nurse Practitioner

## 2022-01-25 ENCOUNTER — Other Ambulatory Visit (HOSPITAL_COMMUNITY): Payer: Self-pay

## 2022-01-25 DIAGNOSIS — K219 Gastro-esophageal reflux disease without esophagitis: Secondary | ICD-10-CM

## 2022-01-25 NOTE — Progress Notes (Signed)
Patient Care Team: [name], NP as PCP - General (Nurse Practitioner) Nicholas Lose, MD as Consulting Physician (Hematology and Oncology) Eppie Gibson, MD as Attending Physician (Radiation Oncology) Erroll Luna, MD as Consulting Physician (General Surgery)  DIAGNOSIS: No diagnosis found.  SUMMARY OF ONCOLOGIC HISTORY: Oncology History  Malignant neoplasm of central portion of right breast in female, estrogen receptor positive ([place])  07/04/2020 Initial Diagnosis   Screening mammogram detected right breast mass and right axillary lymph nodes.  Right breast nipple retraction and retroareolar thickening.  2.4 cm mass with 2 abnormal lymph nodes.  Biopsy revealed invasive lobular cancer grade 1-2 ER 95%, PR 95%, Ki-67 30%, HER2 equivocal by IHC negative by FISH ratio 1.39   07/17/2020 Cancer Staging   Staging form: Breast, AJCC 8th Edition - Clinical stage from 07/17/2020: Stage IIA (cT2, cN1, cM0, G2, ER+, PR+, HER2-) - Signed by Nicholas Lose, MD on 07/17/2020   08/09/2020 Surgery   Rght lumpectomy ([facility]): invasive lobular carcinoma, 3.8cm, clear margins, 19/19 right axillary lymph nodes positive for metastatic carcinoma.    08/09/2020 Cancer Staging   Staging form: Breast, AJCC 8th Edition - Pathologic stage from 08/09/2020: Stage IIIA (pT2, pN3a, cM0, G2, ER+, PR+, HER2-) - Signed by Gardenia Phlegm, NP on 08/23/2020 Stage prefix: Initial diagnosis Histologic grading system: 3 grade system   09/15/2020 - 11/24/2020 Chemotherapy   Adjuvant chemotherapy with TC Q21D x4       01/25/2021 - 03/07/2021 Radiation Therapy   Adjuvant radiation   03/2021 -  Anti-estrogen oral therapy   Letrozole daily     CHIEF COMPLIANT: Follow-up of breast cancer on Verzenio  INTERVAL HISTORY: Shelby [name] is a  51 y.o. with above-mentioned history of breast cancer currently on Verzenio. She presents to the clinic today for a follow-up.    ALLERGIES:  is allergic to codeine and peach  flavor.  MEDICATIONS:  Current Outpatient Medications  Medication Sig Dispense Refill   abemaciclib (VERZENIO) 100 MG tablet Take 1 tablet (100 mg total) by mouth 2 (two) times daily. Swallow tablets whole. Do not chew, crush, or split tablets before swallowing. 56 tablet 3   bacitracin-polymyxin b (POLYSPORIN) ophthalmic ointment Place 1 Application into both eyes every 12 (twelve) hours. apply to eye every 12 hours while awake 3.5 g 1   brompheniramine-pseudoephedrine-DM 30-2-10 MG/5ML syrup Take 5 mLs by mouth 4 (four) times daily as needed. 120 mL 0   cetirizine (ZYRTEC) 10 MG tablet Take 1 tablet (10 mg total) by mouth daily. 30 tablet 11   fluconazole (DIFLUCAN) 150 MG tablet Take one tablet by mouth now. May repeat in 3 days if needed. 2 tablet 0   fluticasone (FLONASE) 50 MCG/ACT nasal spray Place 2 sprays into both nostrils daily. 16 g 6   gabapentin (NEURONTIN) 100 MG capsule TAKE 1-3 CAPSULES BY MOUTH AT BEDTIME 90 capsule 3   ibuprofen (ADVIL) 800 MG tablet Take 1 tablet (800 mg total) by mouth every 8 (eight) hours as needed. 30 tablet 0   letrozole (FEMARA) 2.5 MG tablet Take 1 tablet (2.5 mg total) by mouth daily. 90 tablet 3   metFORMIN (GLUCOPHAGE) 500 MG tablet Take 1 tablet (500 mg total) by mouth 2 (two) times daily with a meal. (NEEDS TO BE SEEN BEFORE NEXT REFILL) 60 tablet 0   metoprolol tartrate (LOPRESSOR) 50 MG tablet Take 1 tablet (50 mg total) by mouth daily. Needs office visit for further refills 30 tablet 0   montelukast (SINGULAIR) 10  MG tablet Take 1 tablet (10 mg total) by mouth at bedtime. 30 tablet 3   mupirocin cream (BACTROBAN) 2 % Apply 1 application topically 2 (two) times daily. 15 g 2   mupirocin ointment (BACTROBAN) 2 % Apply 1 application. topically 2 (two) times daily.     neomycin-bacitracin-polymyxin (NEOSPORIN) [id number] ointment Apply 1 application topically in the morning and at bedtime. 28.3 g 1   nystatin (MYCOSTATIN/NYSTOP) powder Apply 1  application topically 2 (two) times daily. 15 g 4   pantoprazole (PROTONIX) 40 MG tablet TAKE ONE TABLET BY MOUTH at bedtime 30 tablet 1   Semaglutide-Weight Management 0.25 MG/0.5ML SOAJ Inject 0.25 mg into the skin once a week for 28 days. 2 mL 0   No current facility-administered medications for this visit.    PHYSICAL EXAMINATION: ECOG PERFORMANCE STATUS: {CHL ONC ECOG PS:[phone]}  There were no vitals filed for this visit. There were no vitals filed for this visit.  BREAST:*** No palpable masses or nodules in either right or left breasts. No palpable axillary supraclavicular or infraclavicular adenopathy no breast tenderness or nipple discharge. (exam performed in the presence of a chaperone)  LABORATORY DATA:  I have reviewed the data as listed    Latest Ref Rng & Units 10/30/2021    7:51 AM 08/01/2021    8:43 AM 07/17/2021    9:34 AM  CMP  Glucose 70 - 99 mg/dL 121  111  122   BUN 6 - 20 mg/dL 14  17  17    Creatinine 0.44 - 1.00 mg/dL 0.90  0.89  0.84   Sodium 135 - 145 mmol/L 142  141  141   Potassium 3.5 - 5.1 mmol/L 3.8  4.0  4.3   Chloride 98 - 111 mmol/L 108  107  104   CO2 22 - 32 mmol/L 27  23  26    Calcium 8.9 - 10.3 mg/dL 9.2  9.7  9.2   Total Protein 6.5 - 8.1 g/dL 6.6  7.6  6.9   Total Bilirubin 0.3 - 1.2 mg/dL 0.3  0.4  0.4   Alkaline Phos 38 - 126 U/L 76  92  87   AST 15 - 41 U/L 16  16  10    ALT 0 - 44 U/L 22  19  14      Lab Results  Component Value Date   WBC 4.5 10/30/2021   HGB 13.0 10/30/2021   HCT 37.6 10/30/2021   MCV 90.6 10/30/2021   PLT 195 10/30/2021   NEUTROABS 2.7 10/30/2021    ASSESSMENT & PLAN:  No problem-specific Assessment & Plan notes found for this encounter.    No orders of the defined types were placed in this encounter.  The patient has a good understanding of the overall plan. she agrees with it. she will call with any problems that may develop before the next visit here. Total time spent: 30 mins including face to face  time and time spent for planning, charting and co-ordination of care   Suzzette Righter, [place] 01/25/22  I Gardiner Coins am scribing for Dr. Lindi Adie  ***

## 2022-01-30 ENCOUNTER — Inpatient Hospital Stay: Payer: BC Managed Care – PPO | Attending: Hematology and Oncology

## 2022-01-30 ENCOUNTER — Other Ambulatory Visit: Payer: Self-pay | Admitting: Hematology and Oncology

## 2022-01-30 ENCOUNTER — Other Ambulatory Visit: Payer: Self-pay

## 2022-01-30 ENCOUNTER — Other Ambulatory Visit (HOSPITAL_COMMUNITY): Payer: Self-pay

## 2022-01-30 ENCOUNTER — Inpatient Hospital Stay (HOSPITAL_BASED_OUTPATIENT_CLINIC_OR_DEPARTMENT_OTHER): Payer: BC Managed Care – PPO | Admitting: Hematology and Oncology

## 2022-01-30 DIAGNOSIS — E669 Obesity, unspecified: Secondary | ICD-10-CM | POA: Insufficient documentation

## 2022-01-30 DIAGNOSIS — Z853 Personal history of malignant neoplasm of breast: Secondary | ICD-10-CM | POA: Diagnosis not present

## 2022-01-30 DIAGNOSIS — C50111 Malignant neoplasm of central portion of right female breast: Secondary | ICD-10-CM

## 2022-01-30 DIAGNOSIS — Z170 Estrogen receptor positive status [ER+]: Secondary | ICD-10-CM

## 2022-01-30 DIAGNOSIS — Z79811 Long term (current) use of aromatase inhibitors: Secondary | ICD-10-CM | POA: Diagnosis not present

## 2022-01-30 LAB — CBC WITH DIFFERENTIAL (CANCER CENTER ONLY)
Abs Immature Granulocytes: 0.02 10*3/uL (ref 0.00–0.07)
Basophils Absolute: 0 10*3/uL (ref 0.0–0.1)
Basophils Relative: 1 %
Eosinophils Absolute: 0.1 10*3/uL (ref 0.0–0.5)
Eosinophils Relative: 1 %
HCT: 39.6 % (ref 36.0–46.0)
Hemoglobin: 14.2 g/dL (ref 12.0–15.0)
Immature Granulocytes: 0 %
Lymphocytes Relative: 29 %
Lymphs Abs: 1.6 10*3/uL (ref 0.7–4.0)
MCH: 32.7 pg (ref 26.0–34.0)
MCHC: 35.9 g/dL (ref 30.0–36.0)
MCV: 91.2 fL (ref 80.0–100.0)
Monocytes Absolute: 0.3 10*3/uL (ref 0.1–1.0)
Monocytes Relative: 5 %
Neutro Abs: 3.6 10*3/uL (ref 1.7–7.7)
Neutrophils Relative %: 64 %
Platelet Count: 209 10*3/uL (ref 150–400)
RBC: 4.34 MIL/uL (ref 3.87–5.11)
RDW: 12.8 % (ref 11.5–15.5)
WBC Count: 5.6 10*3/uL (ref 4.0–10.5)
nRBC: 0 % (ref 0.0–0.2)

## 2022-01-30 LAB — CMP (CANCER CENTER ONLY)
ALT: 18 U/L (ref 0–44)
AST: 14 U/L — ABNORMAL LOW (ref 15–41)
Albumin: 4 g/dL (ref 3.5–5.0)
Alkaline Phosphatase: 88 U/L (ref 38–126)
Anion gap: 10 (ref 5–15)
BUN: 18 mg/dL (ref 6–20)
CO2: 23 mmol/L (ref 22–32)
Calcium: 9.2 mg/dL (ref 8.9–10.3)
Chloride: 106 mmol/L (ref 98–111)
Creatinine: 0.86 mg/dL (ref 0.44–1.00)
GFR, Estimated: >60 mL/min (ref >60)
Glucose, Bld: 118 mg/dL — ABNORMAL HIGH (ref 70–99)
Potassium: 4 mmol/L (ref 3.5–5.1)
Sodium: 139 mmol/L (ref 135–145)
Total Bilirubin: 0.4 mg/dL (ref 0.3–1.2)
Total Protein: 7.2 g/dL (ref 6.5–8.1)

## 2022-01-30 MED ORDER — ABEMACICLIB 100 MG PO TABS
100.0000 mg | ORAL_TABLET | Freq: Two times a day (BID) | ORAL | 3 refills | Status: DC
Start: 2022-01-30 — End: 2022-05-14
  Filled 2022-01-30: qty 56, 28d supply, fill #0
  Filled 2022-02-26: qty 56, 28d supply, fill #1
  Filled 2022-03-26: qty 56, 28d supply, fill #2
  Filled 2022-04-18: qty 56, 28d supply, fill #3

## 2022-01-30 NOTE — Assessment & Plan Note (Addendum)
07/04/2020:Screening mammogram detected right breast mass and right axillary lymph nodes. Right breast nipple retraction and retroareolar thickening. 2.4 cm mass with 2 abnormal lymph nodes. Biopsy revealed invasive lobular cancer grade 1-2 ER 95%, PR 95%, Ki-67 30%, HER2 equivocal by IHC negative by FISH ratio 1.39 T2N1 stageIIA  Right lumpectomy: 08/09/2020:ILC 3.8 cm, 19/19 LN Positive, Margins Neg  Plan: 1. Staging scans 2. Adjuvant chemo with TC X 6(because of her propensity for diabetes she may only end up getting 4) 3. XRT 01/25/2021- 03/07/21 4. Adj Anti estrogen therapy(with Verzenio) started December 2022 ------------------------------------------------------------------------------------------------------------------------------------ 08/30/2020: CT CAP:No evidence of metastatic disease 08/30/2020: Bone scan:No evidence of bone metastases -------------------------------------------------------------------------------------------------------------------- Anastrozole toxicities:Intermittent hot flashes AbemaciclibToxicities:Denies any side effects,current dosage: 100 mg twice a day(started December 2022)   Breast Cancer Surveillance 1. Breast Exam: 01/30/2022: Benign 2.CT chest1/19/23: Radiation fibrosis, Resolution of Rt Axillary LN, Hep steatosis 3. Mammogram 07/12/21: Benign Density Cat B  Obesity: She is working with a healthy weight loss clinic.  They are figuring out if she can get an injection for weight loss.  Return to clinic in 3 months with labs and follow-up

## 2022-01-31 ENCOUNTER — Telehealth: Payer: Self-pay | Admitting: Hematology and Oncology

## 2022-01-31 NOTE — Telephone Encounter (Signed)
Scheduled appointment per 8/2 los. Patient is aware.

## 2022-02-01 ENCOUNTER — Other Ambulatory Visit (HOSPITAL_COMMUNITY): Payer: Self-pay

## 2022-02-25 ENCOUNTER — Other Ambulatory Visit: Payer: Self-pay | Admitting: Nurse Practitioner

## 2022-02-25 DIAGNOSIS — K219 Gastro-esophageal reflux disease without esophagitis: Secondary | ICD-10-CM

## 2022-02-25 DIAGNOSIS — R7303 Prediabetes: Secondary | ICD-10-CM

## 2022-02-25 NOTE — Telephone Encounter (Signed)
Pt scheduled appt for 03/18/2022, she is going to be on vacation and not able to come in until then Needs refill sent to [facility] in [place]

## 2022-02-25 NOTE — Telephone Encounter (Signed)
Je NTBS 30 days given 01/25/22

## 2022-02-26 ENCOUNTER — Other Ambulatory Visit: Payer: Self-pay | Admitting: Nurse Practitioner

## 2022-02-26 ENCOUNTER — Other Ambulatory Visit (HOSPITAL_COMMUNITY): Payer: Self-pay

## 2022-02-26 DIAGNOSIS — I10 Essential (primary) hypertension: Secondary | ICD-10-CM

## 2022-02-26 DIAGNOSIS — R002 Palpitations: Secondary | ICD-10-CM

## 2022-02-26 MED ORDER — PANTOPRAZOLE SODIUM 40 MG PO TBEC: 40.0000 mg | DELAYED_RELEASE_TABLET | Freq: Every day | ORAL | 0 refills | Status: DC

## 2022-02-26 MED ORDER — METFORMIN HCL 500 MG PO TABS: 500.0000 mg | ORAL_TABLET | Freq: Two times a day (BID) | ORAL | 0 refills | Status: DC

## 2022-02-26 NOTE — Addendum Note (Signed)
Addended by: Antonietta Barcelona D on: 02/26/2022 08:13 AM   Modules accepted: Orders

## 2022-03-18 ENCOUNTER — Ambulatory Visit: Payer: BC Managed Care – PPO | Admitting: Nurse Practitioner

## 2022-03-26 ENCOUNTER — Other Ambulatory Visit (HOSPITAL_COMMUNITY): Payer: Self-pay

## 2022-04-01 ENCOUNTER — Other Ambulatory Visit (HOSPITAL_COMMUNITY): Payer: Self-pay

## 2022-04-02 ENCOUNTER — Ambulatory Visit (INDEPENDENT_AMBULATORY_CARE_PROVIDER_SITE_OTHER): Payer: BC Managed Care – PPO | Admitting: Nurse Practitioner

## 2022-04-02 ENCOUNTER — Encounter: Payer: Self-pay | Admitting: Nurse Practitioner

## 2022-04-02 VITALS — BP 142/88 | HR 70 | Temp 98.9°F | Ht 65.0 in | Wt >= 6400 oz

## 2022-04-02 DIAGNOSIS — J309 Allergic rhinitis, unspecified: Secondary | ICD-10-CM

## 2022-04-02 DIAGNOSIS — I10 Essential (primary) hypertension: Secondary | ICD-10-CM

## 2022-04-02 DIAGNOSIS — M792 Neuralgia and neuritis, unspecified: Secondary | ICD-10-CM

## 2022-04-02 DIAGNOSIS — R7303 Prediabetes: Secondary | ICD-10-CM

## 2022-04-02 DIAGNOSIS — R002 Palpitations: Secondary | ICD-10-CM

## 2022-04-02 DIAGNOSIS — K219 Gastro-esophageal reflux disease without esophagitis: Secondary | ICD-10-CM

## 2022-04-02 MED ORDER — METFORMIN HCL 500 MG PO TABS: 500.0000 mg | ORAL_TABLET | Freq: Two times a day (BID) | ORAL | 0 refills | Status: DC

## 2022-04-02 MED ORDER — METOPROLOL TARTRATE 50 MG PO TABS: 50.0000 mg | ORAL_TABLET | Freq: Every day | ORAL | 0 refills | Status: DC

## 2022-04-02 MED ORDER — MONTELUKAST SODIUM 10 MG PO TABS: 10.0000 mg | ORAL_TABLET | Freq: Every day | ORAL | 3 refills | Status: DC

## 2022-04-02 MED ORDER — GABAPENTIN 100 MG PO CAPS: 100.0000 mg | ORAL_CAPSULE | Freq: Three times a day (TID) | ORAL | 3 refills | Status: DC

## 2022-04-02 NOTE — Patient Instructions (Signed)
Neuropathic Pain Neuropathic pain is pain caused by damage to the nerves that are responsible for certain sensations in your body (sensory nerves). Neuropathic pain can make you more sensitive to pain. Even a minor sensation can feel very painful. This is usually a long-term (chronic) condition that can be difficult to treat. The type of pain differs from person to person. It may: Start suddenly (acute), or it may develop slowly and become chronic. Come and go as damaged nerves heal, or it may stay at the same level for years. Cause emotional distress, loss of sleep, and a lower quality of life. What are the causes? The most common cause of this condition is diabetes. Many other diseases and conditions can also cause neuropathic pain. Causes of neuropathic pain can be classified as: Toxic. This is caused by medicines and chemicals. The most common causes of toxic neuropathic pain is damage from medicines that kill cancer cells (chemotherapy) or alcohol abuse. Metabolic. This can be caused by: Diabetes. Lack of vitamins like B12. Traumatic. Any injury that cuts, crushes, or stretches a nerve can cause damage and pain. Compression-related. If a sensory nerve gets trapped or compressed for a long period of time, the blood supply to the nerve can be cut off. Vascular. Many blood vessel diseases can cause neuropathic pain by decreasing blood supply and oxygen to nerves. Autoimmune. This type of pain results from diseases in which the body's defense system (immune system) mistakenly attacks sensory nerves. Examples of autoimmune diseases that can cause neuropathic pain include lupus and multiple sclerosis. Infectious. Many types of viral infections can damage sensory nerves and cause pain. Shingles infection is a common cause of this type of pain. Inherited. Neuropathic pain can be a symptom of many diseases that are passed down through families (genetic). What increases the risk? You are more likely to  develop this condition if: You have diabetes. You smoke. You drink too much alcohol. You are taking certain medicines, including chemotherapy or medicines that treat immune system disorders. What are the signs or symptoms? The main symptom is pain. Neuropathic pain is often described as: Burning. Shock-like. Stinging. Hot or cold. Itching. How is this diagnosed? No single test can diagnose neuropathic pain. It is diagnosed based on: A physical exam and your symptoms. Your health care provider will ask you about your pain. You may be asked to use a pain scale to describe how bad your pain is. Tests. These may be done to see if you have a cause and location of any nerve damage. They include: Nerve conduction studies and electromyography to test how well nerve signals travel through your nerves and muscles (electrodiagnostic testing). Skin biopsy to evaluate for small fiber neuropathy. Imaging studies, such as: X-rays. CT scan. MRI. How is this treated? Treatment for neuropathic pain may change over time. You may need to try different treatment options or a combination of treatments. Some options include: Treating the underlying cause of the neuropathy, such as diabetes, kidney disease, or vitamin deficiencies. Stopping medicines that can cause neuropathy, such as chemotherapy. Medicine to relieve pain. Medicines may include: Prescription or over-the-counter pain medicine. Anti-seizure medicine. Antidepressant medicines. Pain-relieving patches or creams that are applied to painful areas of skin. A medicine to numb the area (local anesthetic), which can be injected as a nerve block. Transcutaneous nerve stimulation. This uses electrical currents to block painful nerve signals. The treatment is painless. Alternative treatments, such as: Acupuncture. Meditation. Massage. Occupational or physical therapy. Pain management programs. Counseling. Follow   these instructions at  home: Medicines  Take over-the-counter and prescription medicines only as told by your health care provider. Ask your health care provider if the medicine prescribed to you: Requires you to avoid driving or using machinery. Can cause constipation. You may need to take these actions to prevent or treat constipation: Drink enough fluid to keep your urine pale yellow. Take over-the-counter or prescription medicines. Eat foods that are high in fiber, such as beans, whole grains, and fresh fruits and vegetables. Limit foods that are high in fat and processed sugars, such as fried or sweet foods. Lifestyle  Have a good support system at home. Consider joining a chronic pain support group. Do not use any products that contain nicotine or tobacco. These products include cigarettes, chewing tobacco, and vaping devices, such as e-cigarettes. If you need help quitting, ask your health care provider. Do not drink alcohol. General instructions Learn as much as you can about your condition. Work closely with all your health care providers to find the treatment plan that works best for you. Ask your health care provider what activities are safe for you. Keep all follow-up visits. This is important. Contact a health care provider if: Your pain treatments are not working. You are having side effects from your medicines. You are struggling with tiredness (fatigue), mood changes, depression, or anxiety. Get help right away if: You have thoughts of hurting yourself. Get help right away if you feel like you may hurt yourself or others, or have thoughts about taking your own life. Go to your nearest emergency room or: Call 911. Call the National Suicide Prevention Lifeline at 1-800-273-8255 or 988. This is open 24 hours a day. Text the Crisis Text Line at 741741. Summary Neuropathic pain is pain caused by damage to the nerves that are responsible for certain sensations in your body (sensory  nerves). Neuropathic pain may come and go as damaged nerves heal, or it may stay at the same level for years. Neuropathic pain is usually a long-term condition that can be difficult to treat. Consider joining a chronic pain support group. This information is not intended to replace advice given to you by your health care provider. Make sure you discuss any questions you have with your health care provider. Document Revised: 02/12/2021 Document Reviewed: 02/12/2021 Elsevier Patient Education  2023 Elsevier Inc.  

## 2022-04-02 NOTE — Assessment & Plan Note (Signed)
Symptoms are well controlled on current medication no changes necessary.

## 2022-04-02 NOTE — Assessment & Plan Note (Signed)
Patient is attributes in elevation in blood pressure during this visit due to whitecoat syndrome.  Patient reports her blood pressure is well managed and controlled at home and will take him values of daily blood pressure and send it via MyChart.  Education provided to patient to continue low-sodium diet, take all medication as prescribed and follow-up in 6 months.  Patient verbalized understanding. Labs will be completed at patient's routine lab check at her follow-up appointment

## 2022-04-02 NOTE — Progress Notes (Signed)
Established Patient Office Visit  Subjective   Patient ID: Shelby [name], female    DOB: August 20, 1970  Age: 51 y.o. MRN: [id number]  Chief Complaint  Patient presents with   Medication Refill    HPI   Hypertension: Patient here for follow-up of elevated blood pressure. She is not exercising and is adherent to low salt diet.  Blood pressure is well controlled at home. Cardiac symptoms none. Patient denies none.  Cardiovascular risk factors: hypertension, obesity (BMI >= 30 kg/m2), and sedentary lifestyle. Use of agents associated with hypertension: none. History of target organ damage: none.   GERD, Follow up:  The patient was last seen for GERD 6 months ago. Changes made since that visit include .  She reports good compliance with treatment. She is not having side effects. Omeprazole as needed  She IS experiencing  no new symptoms . She is NOT experiencing abdominal bloating, belching, belching and eructation, bilious reflux, chest pain, choking on food, cough, or deep pressure at base of neck    Patient Active Problem List   Diagnosis Date Noted   Neuropathic pain 04/02/2022   Malignant neoplasm of central portion of right breast in female, estrogen receptor positive ([place]) 07/17/2020   Annual physical exam 07/11/2020   Wound check, abscess 07/11/2020   Allergic rhinitis 08/18/2017   Anxiety 08/18/2017   Nonhealing nonsurgical wound 05/13/2016   Gastroesophageal reflux disease without esophagitis 04/22/2016   Eustachian tube dysfunction, bilateral 04/22/2016   Moderate single current episode of major depressive disorder ([place]) 04/22/2016   Essential hypertension 03/19/2016   Hypertriglyceridemia 03/19/2016   Morbid obesity ([place]) 03/19/2016   Palpitations 03/19/2016   Past Medical History:  Diagnosis Date   Cancer ([place])    right breast   Complication of anesthesia    hard to wake up  per pt   Dyspnea    GERD (gastroesophageal reflux disease)    Heart palpitations     Obesity    Personal history of chemotherapy    Personal history of radiation therapy    Pre-diabetes    Past Surgical History:  Procedure Laterality Date   BREAST LUMPECTOMY     BREAST LUMPECTOMY WITH RADIOACTIVE SEED AND SENTINEL LYMPH NODE BIOPSY Right 08/09/2020   Procedure: RIGHT BREAST LUMPECTOMY WITH RADIOACTIVE SEED AND RIGHT BREAST RADIOACTIVE SEED TARGETED SENTINEL LYMPH NODE BIOPSY, RIGHT BREAST SENTINEL LYMPH NODE MAPPING;  Surgeon: Erroll Luna, MD;  Location: [facility];  Service: General;  Laterality: Right;  PEC BLOCK   BREAST SURGERY     CARDIAC CATHETERIZATION     CHOLECYSTECTOMY     DILATION AND CURETTAGE OF UTERUS     x1   HERNIA REPAIR     IR IMAGING GUIDED PORT INSERTION  08/29/2020   IR IMAGING GUIDED PORT INSERTION  08/29/2020   lumpectomy right breast     PORT-A-CATH REMOVAL N/A 04/05/2021   Procedure: PORT REMOVAL;  Surgeon: Erroll Luna, MD;  Location: [facility];  Service: General;  Laterality: N/A;   Social History   Tobacco Use   Smoking status: Never   Smokeless tobacco: Never  Vaping Use   Vaping Use: Never used  Substance Use Topics   Alcohol use: No   Drug use: No   Social History   Socioeconomic History   Marital status: Married    Spouse name: Not on file   Number of children: Not on file   Years of education: Not on file   Highest education level: Not on file  Occupational History   Not on file  Tobacco Use   Smoking status: Never   Smokeless tobacco: Never  Vaping Use   Vaping Use: Never used  Substance and Sexual Activity   Alcohol use: No   Drug use: No   Sexual activity: Yes    Partners: Male    Birth control/protection: Pill  Other Topics Concern   Not on file  Social History Narrative   Not on file   Social Determinants of Health   Financial Resource Strain: Low Risk  (06/01/2021)   Overall Financial Resource Strain (CARDIA)    Difficulty of Paying Living Expenses: Not hard at all  Food Insecurity: No Food Insecurity  (06/01/2021)   Hunger Vital Sign    Worried About Running Out of Food in the Last Year: Never true    [place] in the Last Year: Never true  Transportation Needs: No Transportation Needs (06/01/2021)   PRAPARE - Hydrologist (Medical): No    Lack of Transportation (Non-Medical): No  Physical Activity: Inactive (06/01/2021)   Exercise Vital Sign    Days of Exercise per Week: 0 days    Minutes of Exercise per Session: 0 min  Stress: Stress Concern Present (06/01/2021)   [place]    Feeling of Stress : To some extent  Social Connections: Moderately Isolated (06/01/2021)   Social Connection and Isolation Panel [NHANES]    Frequency of Communication with Friends and Family: Three times a week    Frequency of Social Gatherings with Friends and Family: Twice a week    Attends Religious Services: Never    Marine scientist or Organizations: No    Attends Archivist Meetings: Never    Marital Status: Married  Human resources officer Violence: Not At Risk (06/01/2021)   Humiliation, Afraid, Rape, and Kick questionnaire    Fear of Current or Ex-Partner: No    Emotionally Abused: No    Physically Abused: No    Sexually Abused: No   Family Status  Relation Name Status   Mother  Deceased   Father  Deceased   Family History  Adopted: Yes      Review of Systems  Constitutional: Negative.  Negative for chills and fever.  HENT: Negative.    Respiratory: Negative.    Cardiovascular: Negative.   Gastrointestinal: Negative.   Genitourinary: Negative.   Musculoskeletal: Negative.   Skin: Negative.   Neurological: Negative.   All other systems reviewed and are negative.     Objective:     BP (!) 142/88   Pulse 70   Temp 98.9 F (37.2 C)   Ht '5\' 5"'$  (1.651 m)   Wt (!) 416 lb (188.7 kg)   LMP 05/13/2016   SpO2 95%   BMI 69.23 kg/m  BP Readings from Last 3 Encounters:   04/02/22 (!) 142/88  01/30/22 136/86  10/30/21 (!) 155/80   Wt Readings from Last 3 Encounters:  04/02/22 (!) 416 lb (188.7 kg)  01/30/22 (!) 403 lb (182.8 kg)  10/30/21 (!) 397 lb 11.2 oz (180.4 kg)      Physical Exam Vitals and nursing note reviewed.  Constitutional:      Appearance: Normal appearance. She is obese.  HENT:     Head: Normocephalic.     Right Ear: External ear normal.     Left Ear: External ear normal.     Nose: Nose normal.  Mouth/Throat:     Mouth: Mucous membranes are moist.  Eyes:     Conjunctiva/sclera: Conjunctivae normal.  Cardiovascular:     Rate and Rhythm: Normal rate and regular rhythm.     Pulses: Normal pulses.     Heart sounds: Normal heart sounds.  Pulmonary:     Effort: Pulmonary effort is normal.     Breath sounds: Normal breath sounds.  Abdominal:     General: Bowel sounds are normal.  Skin:    Findings: No erythema or rash.  Neurological:     General: No focal deficit present.     Mental Status: She is alert and oriented to person, place, and time.  Psychiatric:        Behavior: Behavior normal.      No results found for any visits on 04/02/22.     The ASCVD Risk score (Arnett DK, et al., 2019) failed to calculate for the following reasons:   The valid total cholesterol range is 130 to 320 mg/dL    Assessment & Plan:   Problem List Items Addressed This Visit       Cardiovascular and Mediastinum   Essential hypertension    Patient is attributes in elevation in blood pressure during this visit due to whitecoat syndrome.  Patient reports her blood pressure is well managed and controlled at home and will take him values of daily blood pressure and send it via MyChart.  Education provided to patient to continue low-sodium diet, take all medication as prescribed and follow-up in 6 months.  Patient verbalized understanding. Labs will be completed at patient's routine lab check at her follow-up appointment      Relevant  Medications   metoprolol tartrate (LOPRESSOR) 50 MG tablet     Digestive   Gastroesophageal reflux disease without esophagitis    Symptoms are well controlled on current medication no changes necessary.        Other   Palpitations   Relevant Medications   metoprolol tartrate (LOPRESSOR) 50 MG tablet   Neuropathic pain - Primary    After completing chemotherapy patient is reporting worsening neuropathic pain and weakness bilateral lower extremity even with Neurontin daily.  Patient reports that she is no longer able to stand and has fallen a few times.  She is now using a scooter that she purchased out-of-pocket that has recently fallen apart.  Patient is needing a scooter to do her daily errands  with the help of her spouse.  Increase Neurontin from 200 mg tablet by mouth at bedtime to 100 mg tablet by mouth 3 times daily [300 mg daily]  Follow-up with worsening unresolved symptoms.      Relevant Medications   gabapentin (NEURONTIN) 100 MG capsule   Other Relevant Orders   For home use only DME Other see comment   Other Visit Diagnoses     Chronic allergic rhinitis       Relevant Medications   montelukast (SINGULAIR) 10 MG tablet   Prediabetes       Relevant Medications   metFORMIN (GLUCOPHAGE) 500 MG tablet       Return in about 6 months (around 10/02/2022) for chronic disease management.    [name], NP

## 2022-04-02 NOTE — Assessment & Plan Note (Signed)
After completing chemotherapy patient is reporting worsening neuropathic pain and weakness bilateral lower extremity even with Neurontin daily.  Patient reports that she is no longer able to stand and has fallen a few times.  She is now using a scooter that she purchased out-of-pocket that has recently fallen apart.  Patient is needing a scooter to do her daily errands  with the help of her spouse.  Increase Neurontin from 200 mg tablet by mouth at bedtime to 100 mg tablet by mouth 3 times daily [300 mg daily]  Follow-up with worsening unresolved symptoms.

## 2022-04-18 ENCOUNTER — Other Ambulatory Visit (HOSPITAL_COMMUNITY): Payer: Self-pay

## 2022-04-18 ENCOUNTER — Telehealth: Payer: Self-pay | Admitting: Nurse Practitioner

## 2022-04-18 NOTE — Telephone Encounter (Signed)
I did not receive this, putting message w/ documentation information on PCP's desk. Will need this back before I can do anything further. I usually use [employer].

## 2022-04-19 ENCOUNTER — Other Ambulatory Visit: Payer: Self-pay | Admitting: Nurse Practitioner

## 2022-04-19 DIAGNOSIS — R531 Weakness: Secondary | ICD-10-CM

## 2022-04-19 DIAGNOSIS — M792 Neuralgia and neuritis, unspecified: Secondary | ICD-10-CM

## 2022-04-23 ENCOUNTER — Other Ambulatory Visit (HOSPITAL_COMMUNITY): Payer: Self-pay

## 2022-04-24 ENCOUNTER — Encounter: Payer: Self-pay | Admitting: Nurse Practitioner

## 2022-04-24 ENCOUNTER — Telehealth: Payer: BC Managed Care – PPO | Admitting: Family Medicine

## 2022-04-24 ENCOUNTER — Other Ambulatory Visit (HOSPITAL_COMMUNITY): Payer: Self-pay

## 2022-04-24 ENCOUNTER — Telehealth (INDEPENDENT_AMBULATORY_CARE_PROVIDER_SITE_OTHER): Payer: BC Managed Care – PPO | Admitting: Nurse Practitioner

## 2022-04-24 DIAGNOSIS — J0100 Acute maxillary sinusitis, unspecified: Secondary | ICD-10-CM

## 2022-04-24 MED ORDER — BENZONATATE 100 MG PO CAPS: 100.0000 mg | ORAL_CAPSULE | Freq: Three times a day (TID) | ORAL | 0 refills | Status: DC | PRN

## 2022-04-24 MED ORDER — GUAIFENESIN ER 600 MG PO TB12: 600.0000 mg | ORAL_TABLET | Freq: Two times a day (BID) | ORAL | 0 refills | Status: DC

## 2022-04-24 MED ORDER — DOXYCYCLINE HYCLATE 100 MG PO TABS: 100.0000 mg | ORAL_TABLET | Freq: Two times a day (BID) | ORAL | 0 refills | Status: DC

## 2022-04-24 NOTE — Progress Notes (Signed)
   Virtual Visit  Note Due to COVID-19 pandemic this visit was conducted virtually. This visit type was conducted due to national recommendations for restrictions regarding the COVID-19 Pandemic (e.g. social distancing, sheltering in place) in an effort to limit this patient's exposure and mitigate transmission in our community. All issues noted in this document were discussed and addressed.  A physical exam was not performed with this format.  I connected with Shelby [name] on 04/24/22 at 8:55 am  by telephone and verified that I am speaking with the correct person using two identifiers. Shelby [name] is currently located at home during visit. The provider, [name], NP is located in their office at time of visit.  I discussed the limitations, risks, security and privacy concerns of performing an evaluation and management service by telephone and the availability of in person appointments. I also discussed with the patient that there may be a patient responsible charge related to this service. The patient expressed understanding and agreed to proceed.   History and Present Illness:  Sinusitis This is a new problem. Episode onset: in the past 3-4 days. The problem has been gradually worsening since onset. There has been no fever. The pain is moderate. Associated symptoms include congestion, coughing, ear pain, headaches and sinus pressure. Pertinent negatives include no chills, sore throat or swollen glands. Past treatments include acetaminophen. The treatment provided mild relief.      Review of Systems  Constitutional: Negative.  Negative for chills, fever and malaise/fatigue.  HENT:  Positive for congestion, ear pain and sinus pressure. Negative for sore throat.   Respiratory:  Positive for cough.   Cardiovascular: Negative.   Skin: Negative.  Negative for itching and rash.  Neurological:  Positive for headaches.  All other systems reviewed and are  negative.    Observations/Objective: Tele visit, patient is not in distress  Assessment and Plan: Patient presents with sinusitis Take meds as prescribed - Use a cool mist humidifier  -Use saline nose sprays frequently -Force fluids -For fever or aches or pains- take Tylenol or ibuprofen. -If symptoms do not improve, she may need to be COVID tested to rule this out -at home Covid-19 test negative  Follow Up Instructions: Follow up with worsening unresolved symptoms     I discussed the assessment and treatment plan with the patient. The patient was provided an opportunity to ask questions and all were answered. The patient agreed with the plan and demonstrated an understanding of the instructions.   The patient was advised to call back or seek an in-person evaluation if the symptoms worsen or if the condition fails to improve as anticipated.  The above assessment and management plan was discussed with the patient. The patient verbalized understanding of and has agreed to the management plan. Patient is aware to call the clinic if symptoms persist or worsen. Patient is aware when to return to the clinic for a follow-up visit. Patient educated on when it is appropriate to go to the emergency department.   Time call ended:  9:11 am  I provided 11 minutes of  non face-to-face time during this encounter.    [name], NP

## 2022-04-24 NOTE — Addendum Note (Signed)
Addended by: [name] on: 04/24/2022 10:31 AM   Modules accepted: Orders

## 2022-04-24 NOTE — Telephone Encounter (Signed)
Pt calling back about this message. Please call back

## 2022-04-25 NOTE — Telephone Encounter (Signed)
R/C to [employer]

## 2022-04-25 NOTE — Telephone Encounter (Signed)
TC back to pt to let her know this had been faxed

## 2022-04-25 NOTE — Telephone Encounter (Signed)
LMOVM order has been faxed to [employer] fax# [phone]

## 2022-05-03 ENCOUNTER — Inpatient Hospital Stay: Payer: BC Managed Care – PPO | Admitting: Hematology and Oncology

## 2022-05-03 ENCOUNTER — Other Ambulatory Visit: Payer: Self-pay

## 2022-05-03 ENCOUNTER — Inpatient Hospital Stay: Payer: BC Managed Care – PPO | Attending: Hematology and Oncology

## 2022-05-03 VITALS — BP 143/93 | HR 93 | Temp 97.5°F | Resp 19 | Ht 65.0 in | Wt >= 6400 oz

## 2022-05-03 DIAGNOSIS — E669 Obesity, unspecified: Secondary | ICD-10-CM | POA: Insufficient documentation

## 2022-05-03 DIAGNOSIS — C50111 Malignant neoplasm of central portion of right female breast: Secondary | ICD-10-CM

## 2022-05-03 DIAGNOSIS — G620 Drug-induced polyneuropathy: Secondary | ICD-10-CM | POA: Diagnosis not present

## 2022-05-03 DIAGNOSIS — Z79811 Long term (current) use of aromatase inhibitors: Secondary | ICD-10-CM | POA: Diagnosis not present

## 2022-05-03 DIAGNOSIS — C773 Secondary and unspecified malignant neoplasm of axilla and upper limb lymph nodes: Secondary | ICD-10-CM | POA: Diagnosis not present

## 2022-05-03 DIAGNOSIS — Z170 Estrogen receptor positive status [ER+]: Secondary | ICD-10-CM

## 2022-05-03 LAB — CMP (CANCER CENTER ONLY)
ALT: 16 U/L (ref 0–44)
AST: 17 U/L (ref 15–41)
Albumin: 3.8 g/dL (ref 3.5–5.0)
Alkaline Phosphatase: 92 U/L (ref 38–126)
Anion gap: 8 (ref 5–15)
BUN: 17 mg/dL (ref 6–20)
CO2: 27 mmol/L (ref 22–32)
Calcium: 9.1 mg/dL (ref 8.9–10.3)
Chloride: 105 mmol/L (ref 98–111)
Creatinine: 0.91 mg/dL (ref 0.44–1.00)
GFR, Estimated: >60 mL/min (ref >60)
Glucose, Bld: 119 mg/dL — ABNORMAL HIGH (ref 70–99)
Potassium: 4.3 mmol/L (ref 3.5–5.1)
Sodium: 140 mmol/L (ref 135–145)
Total Bilirubin: 0.5 mg/dL (ref 0.3–1.2)
Total Protein: 7 g/dL (ref 6.5–8.1)

## 2022-05-03 LAB — CBC WITH DIFFERENTIAL (CANCER CENTER ONLY)
Abs Immature Granulocytes: 0.02 10*3/uL (ref 0.00–0.07)
Basophils Absolute: 0 10*3/uL (ref 0.0–0.1)
Basophils Relative: 1 %
Eosinophils Absolute: 0.1 10*3/uL (ref 0.0–0.5)
Eosinophils Relative: 1 %
HCT: 40.7 % (ref 36.0–46.0)
Hemoglobin: 14 g/dL (ref 12.0–15.0)
Immature Granulocytes: 0 %
Lymphocytes Relative: 26 %
Lymphs Abs: 1.7 10*3/uL (ref 0.7–4.0)
MCH: 32 pg (ref 26.0–34.0)
MCHC: 34.4 g/dL (ref 30.0–36.0)
MCV: 92.9 fL (ref 80.0–100.0)
Monocytes Absolute: 0.4 10*3/uL (ref 0.1–1.0)
Monocytes Relative: 5 %
Neutro Abs: 4.4 10*3/uL (ref 1.7–7.7)
Neutrophils Relative %: 67 %
Platelet Count: 200 10*3/uL (ref 150–400)
RBC: 4.38 MIL/uL (ref 3.87–5.11)
RDW: 12.9 % (ref 11.5–15.5)
WBC Count: 6.5 10*3/uL (ref 4.0–10.5)
nRBC: 0 % (ref 0.0–0.2)

## 2022-05-03 NOTE — Progress Notes (Signed)
Patient Care Team: [name], NP as PCP - General (Nurse Practitioner) Nicholas Lose, MD as Consulting Physician (Hematology and Oncology) Eppie Gibson, MD as Attending Physician (Radiation Oncology) Erroll Luna, MD as Consulting Physician (General Surgery)  DIAGNOSIS:  Encounter Diagnosis  Name Primary?   Malignant neoplasm of central portion of right breast in female, estrogen receptor positive ([place]) Yes    SUMMARY OF ONCOLOGIC HISTORY: Oncology History  Malignant neoplasm of central portion of right breast in female, estrogen receptor positive ([place])  07/04/2020 Initial Diagnosis   Screening mammogram detected right breast mass and right axillary lymph nodes.  Right breast nipple retraction and retroareolar thickening.  2.4 cm mass with 2 abnormal lymph nodes.  Biopsy revealed invasive lobular cancer grade 1-2 ER 95%, PR 95%, Ki-67 30%, HER2 equivocal by IHC negative by FISH ratio 1.39   07/17/2020 Cancer Staging   Staging form: Breast, AJCC 8th Edition - Clinical stage from 07/17/2020: Stage IIA (cT2, cN1, cM0, G2, ER+, PR+, HER2-) - Signed by Nicholas Lose, MD on 07/17/2020   08/09/2020 Surgery   Rght lumpectomy ([place]): invasive lobular carcinoma, 3.8cm, clear margins, 19/19 right axillary lymph nodes positive for metastatic carcinoma.    08/09/2020 Cancer Staging   Staging form: Breast, AJCC 8th Edition - Pathologic stage from 08/09/2020: Stage IIIA (pT2, pN3a, cM0, G2, ER+, PR+, HER2-) - Signed by Gardenia Phlegm, NP on 08/23/2020 Stage prefix: Initial diagnosis Histologic grading system: 3 grade system   09/15/2020 - 11/24/2020 Chemotherapy   Adjuvant chemotherapy with TC Q21D x4       01/25/2021 - 03/07/2021 Radiation Therapy   Adjuvant radiation   03/2021 -  Anti-estrogen oral therapy   Letrozole daily     CHIEF COMPLIANT: Follow-up on Verzinio  INTERVAL HISTORY: Shelby [name] is a 51 year old above-mentioned history of breast cancer was currently on  Verzinio on letrozole and she appears to be tolerating it fairly well.  She denies any new problems or concerns.  Her biggest issue is the fear of cancer recurrence.  She denies any diarrhea or nausea vomiting.  She does have intermittent sharp pains in the right chest wall and breast area.   ALLERGIES:  is allergic to codeine, grapefruit extract, and peach flavor.  MEDICATIONS:  Current Outpatient Medications  Medication Sig Dispense Refill   abemaciclib (VERZENIO) 100 MG tablet Take 1 tablet (100 mg total) by mouth 2 (two) times daily. Swallow tablets whole. Do not chew, crush, or split tablets before swallowing. 56 tablet 3   bacitracin-polymyxin b (POLYSPORIN) ophthalmic ointment Place 1 Application into both eyes every 12 (twelve) hours. apply to eye every 12 hours while awake 3.5 g 1   benzonatate (TESSALON PERLES) 100 MG capsule Take 1 capsule (100 mg total) by mouth 3 (three) times daily as needed. 20 capsule 0   brompheniramine-pseudoephedrine-DM 30-2-10 MG/5ML syrup Take 5 mLs by mouth 4 (four) times daily as needed. 120 mL 0   cetirizine (ZYRTEC) 10 MG tablet Take 1 tablet (10 mg total) by mouth daily. 30 tablet 11   doxycycline (VIBRA-TABS) 100 MG tablet Take 1 tablet (100 mg total) by mouth 2 (two) times daily. 14 tablet 0   fluconazole (DIFLUCAN) 150 MG tablet Take one tablet by mouth now. May repeat in 3 days if needed. 2 tablet 0   fluticasone (FLONASE) 50 MCG/ACT nasal spray Place 2 sprays into both nostrils daily. 16 g 6   gabapentin (NEURONTIN) 100 MG capsule Take 1 capsule (100 mg total) by mouth  3 (three) times daily. 90 capsule 3   guaiFENesin (MUCINEX) 600 MG 12 hr tablet Take 1 tablet (600 mg total) by mouth 2 (two) times daily. 30 tablet 0   ibuprofen (ADVIL) 800 MG tablet Take 1 tablet (800 mg total) by mouth every 8 (eight) hours as needed. 30 tablet 0   letrozole (FEMARA) 2.5 MG tablet Take 1 tablet (2.5 mg total) by mouth daily. 90 tablet 3   metFORMIN (GLUCOPHAGE)  500 MG tablet Take 1 tablet (500 mg total) by mouth 2 (two) times daily with a meal. 60 tablet 0   metoprolol tartrate (LOPRESSOR) 50 MG tablet Take 1 tablet (50 mg total) by mouth daily. Needs office visit for further refills 30 tablet 0   montelukast (SINGULAIR) 10 MG tablet Take 1 tablet (10 mg total) by mouth at bedtime. 30 tablet 3   mupirocin cream (BACTROBAN) 2 % Apply 1 application topically 2 (two) times daily. 15 g 2   mupirocin ointment (BACTROBAN) 2 % Apply 1 application. topically 2 (two) times daily.     neomycin-bacitracin-polymyxin (NEOSPORIN) [phone] ointment Apply 1 application topically in the morning and at bedtime. 28.3 g 1   nystatin (MYCOSTATIN/NYSTOP) powder Apply 1 application topically 2 (two) times daily. 15 g 4   pantoprazole (PROTONIX) 40 MG tablet Take 1 tablet (40 mg total) by mouth at bedtime. 30 tablet 0   Semaglutide-Weight Management 0.25 MG/0.5ML SOAJ Inject 0.25 mg into the skin once a week for 28 days. 2 mL 0   No current facility-administered medications for this visit.    PHYSICAL EXAMINATION: ECOG PERFORMANCE STATUS: 1 - Symptomatic but completely ambulatory  Vitals:   05/03/22 0838  BP: (!) 143/93  Pulse: 93  Resp: 19  Temp: (!) 97.5 F (36.4 C)  SpO2: 99%   Filed Weights   05/03/22 0838  Weight: (!) 418 lb 6.4 oz (189.8 kg)    BREAST: No palpable masses or nodules in either right or left breasts. No palpable axillary supraclavicular or infraclavicular adenopathy no breast tenderness or nipple discharge. (exam performed in the presence of a chaperone)  LABORATORY DATA:  I have reviewed the data as listed    Latest Ref Rng & Units 05/03/2022    8:18 AM 01/30/2022    8:16 AM 10/30/2021    7:51 AM  CMP  Glucose 70 - 99 mg/dL 119  118  121   BUN 6 - 20 mg/dL _0 Creatinine 0.44 - 1.00 mg/dL 0.91  0.86  0.90   Sodium 135 - 145 mmol/L 140  139  142   Potassium 3.5 - 5.1 mmol/L 4.3  4.0  3.8   Chloride 98 - 111 mmol/L 105  106   108   CO2 22 - 32 mmol/L _1 Calcium 8.9 - 10.3 mg/dL 9.1  9.2  9.2   Total Protein 6.5 - 8.1 g/dL 7.0  7.2  6.6   Total Bilirubin 0.3 - 1.2 mg/dL 0.5  0.4  0.3   Alkaline Phos 38 - 126 U/L 92  88  76   AST 15 - 41 U/L _2 ALT 0 - 44 U/L _3 Lab Results  Component Value Date   WBC 6.5 05/03/2022   HGB 14.0 05/03/2022   HCT 40.7 05/03/2022   MCV 92.9 05/03/2022   PLT 200 05/03/2022   NEUTROABS 4.4 05/03/2022  ASSESSMENT & PLAN:  Malignant neoplasm of central portion of right breast in female, estrogen receptor positive ([place]) 07/04/2020:Screening mammogram detected right breast mass and right axillary lymph nodes.  Right breast nipple retraction and retroareolar thickening.  2.4 cm mass with 2 abnormal lymph nodes.  Biopsy revealed invasive lobular cancer grade 1-2 ER 95%, PR 95%, Ki-67 30%, HER2 equivocal by IHC negative by FISH ratio 1.39 T2N1 stage IIA   Right lumpectomy: 08/09/2020: ILC 3.8 cm, 19/19 LN Positive, Margins Neg   Plan: 1. Staging scans 2. Adjuvant chemo with TC X 6 (because of her propensity for diabetes she may only end up getting 4) 3. XRT 01/25/2021- 03/07/21 4. Adj Anti estrogen therapy (with Verzenio) started December 2022 ------------------------------------------------------------------------------------------------------------------------------------ 08/30/2020: CT CAP: No evidence of metastatic disease 08/30/2020: Bone scan: No evidence of bone metastases --------------------------------------------------------------------------------------------------------------------  Anastrozole toxicities: Intermittent hot flashes Abemaciclib Toxicities: Denies any side effects, current dosage: 100 mg twice a day (started December 2022)    Breast Cancer Surveillance 1. Breast Exam: 01/30/2022: Benign 2. CT chest 07/19/21: Radiation fibrosis, Resolution of Rt Axillary LN, Hep steatosis 3. Mammogram 07/12/21: Benign Density Cat B   Obesity: She  is working with a healthy weight loss clinic.  Her insurance denied weight loss injections. Fear of breast cancer recurrence: She is stressed out about recurrence of breast cancer.  We discussed this at length today. Chemo-induced peripheral neuropathy: Currently on gabapentin 200 mg 3 times daily.  Return to clinic in 3 months with labs and follow-up   No orders of the defined types were placed in this encounter.  The patient has a good understanding of the overall plan. she agrees with it. she will call with any problems that may develop before the next visit here. Total time spent: 30 mins including face to face time and time spent for planning, charting and co-ordination of care   Harriette Ohara, MD 05/03/22

## 2022-05-03 NOTE — Assessment & Plan Note (Signed)
07/04/2020:Screening mammogram detected right breast mass and right axillary lymph nodes. Right breast nipple retraction and retroareolar thickening. 2.4 cm mass with 2 abnormal lymph nodes. Biopsy revealed invasive lobular cancer grade 1-2 ER 95%, PR 95%, Ki-67 30%, HER2 equivocal by IHC negative by FISH ratio 1.39 T2N1 stageIIA  Right lumpectomy: 08/09/2020:ILC 3.8 cm, 19/19 LN Positive, Margins Neg  Plan: 1. Staging scans 2. Adjuvant chemo with TC X 6(because of her propensity for diabetes she may only end up getting 4) 3. XRT 01/25/2021- 03/07/21 4. Adj Anti estrogen therapy(with Verzenio) started December 2022 ------------------------------------------------------------------------------------------------------------------------------------ 08/30/2020: CT CAP:No evidence of metastatic disease 08/30/2020: Bone scan:No evidence of bone metastases -------------------------------------------------------------------------------------------------------------------- Anastrozole toxicities:Intermittent hot flashes AbemaciclibToxicities:Denies any side effects,current dosage: 100 mg twice a day(started December 2022)   Breast Cancer Surveillance 1. Breast Exam: 01/30/2022: Benign 2.CT chest1/19/23: Radiation fibrosis, Resolution of Rt Axillary LN, Hep steatosis 3. Mammogram 07/12/21: Benign Density Cat B  Obesity: She is working with a healthy weight loss clinic.  They are figuring out if she can get an injection for weight loss.  Return to clinic in 3 months with labs and follow-up 

## 2022-05-06 ENCOUNTER — Other Ambulatory Visit: Payer: Self-pay | Admitting: Nurse Practitioner

## 2022-05-06 DIAGNOSIS — K219 Gastro-esophageal reflux disease without esophagitis: Secondary | ICD-10-CM

## 2022-05-06 DIAGNOSIS — R002 Palpitations: Secondary | ICD-10-CM

## 2022-05-06 DIAGNOSIS — I10 Essential (primary) hypertension: Secondary | ICD-10-CM

## 2022-05-06 DIAGNOSIS — R7303 Prediabetes: Secondary | ICD-10-CM

## 2022-05-08 NOTE — Telephone Encounter (Signed)
Fax from [employer], they do not provider scooters. Faxing order to [employer] at [phone]

## 2022-05-14 ENCOUNTER — Other Ambulatory Visit (HOSPITAL_COMMUNITY): Payer: Self-pay

## 2022-05-14 ENCOUNTER — Encounter: Payer: Self-pay | Admitting: Family Medicine

## 2022-05-14 ENCOUNTER — Other Ambulatory Visit: Payer: Self-pay | Admitting: Hematology and Oncology

## 2022-05-14 ENCOUNTER — Ambulatory Visit (INDEPENDENT_AMBULATORY_CARE_PROVIDER_SITE_OTHER): Payer: BC Managed Care – PPO | Admitting: Family Medicine

## 2022-05-14 DIAGNOSIS — Z20828 Contact with and (suspected) exposure to other viral communicable diseases: Secondary | ICD-10-CM | POA: Diagnosis not present

## 2022-05-14 DIAGNOSIS — J069 Acute upper respiratory infection, unspecified: Secondary | ICD-10-CM | POA: Diagnosis not present

## 2022-05-14 MED ORDER — ALBUTEROL SULFATE HFA 108 (90 BASE) MCG/ACT IN AERS: 2.0000 | INHALATION_SPRAY | Freq: Four times a day (QID) | RESPIRATORY_TRACT | 0 refills | Status: DC | PRN

## 2022-05-14 MED ORDER — PREDNISONE 20 MG PO TABS: 40.0000 mg | ORAL_TABLET | Freq: Every day | ORAL | 0 refills | Status: AC

## 2022-05-14 MED ORDER — ABEMACICLIB 100 MG PO TABS
100.0000 mg | ORAL_TABLET | Freq: Two times a day (BID) | ORAL | 3 refills | Status: DC
  Filled 2022-05-14: qty 56, 28d supply, fill #0
  Filled 2022-06-18 (×2): qty 56, 28d supply, fill #1
  Filled 2022-07-11: qty 56, 28d supply, fill #2
  Filled 2022-08-07: qty 56, 28d supply, fill #3

## 2022-05-14 NOTE — Progress Notes (Signed)
Virtual Visit via telephone Note Due to COVID-19 pandemic this visit was conducted virtually. This visit type was conducted due to national recommendations for restrictions regarding the COVID-19 Pandemic (e.g. social distancing, sheltering in place) in an effort to limit this patient's exposure and mitigate transmission in our community. All issues noted in this document were discussed and addressed.  A physical exam was not performed with this format.   I connected with Shelby [name] on 05/14/2022 at 1230 by telephone and verified that I am speaking with the correct person using two identifiers. Shelby [name] is currently located at home and family is currently with them during visit. The provider, Monia Pouch, FNP is located in their office at time of visit.  I discussed the limitations, risks, security and privacy concerns of performing an evaluation and management service by virtual visit and the availability of in person appointments. I also discussed with the patient that there may be a patient responsible charge related to this service. The patient expressed understanding and agreed to proceed.  Subjective:  Patient ID: Shelby [name], female    DOB: [birth year], 51 y.o.   MRN: [id number]  Chief Complaint:  Wheezing   HPI: Shelby [name] is a 51 y.o. female presenting on 05/14/2022 for Wheezing   Pt reports congestion, cough, and wheezing. Recently exposed RSV. No fever, chills, weakness, shortness of breath, chest pain, or confusion.  Wheezing  This is a new problem. The current episode started in the past 7 days. The problem has been waxing and waning. Associated symptoms include coughing and rhinorrhea. Pertinent negatives include no abdominal pain, chest pain, chills, coryza, diarrhea, ear pain, fever, headaches, hemoptysis, neck pain, rash, shortness of breath, sore throat, sputum production, swollen glands or vomiting. Nothing aggravates the symptoms. She has tried OTC cough  suppressant for the symptoms. The treatment provided no relief.     Relevant past medical, surgical, family, and social history reviewed and updated as indicated.  Allergies and medications reviewed and updated.   Past Medical History:  Diagnosis Date   Cancer ([place])    right breast   Complication of anesthesia    hard to wake up  per pt   Dyspnea    GERD (gastroesophageal reflux disease)    Heart palpitations    Obesity    Personal history of chemotherapy    Personal history of radiation therapy    Pre-diabetes     Past Surgical History:  Procedure Laterality Date   BREAST LUMPECTOMY     BREAST LUMPECTOMY WITH RADIOACTIVE SEED AND SENTINEL LYMPH NODE BIOPSY Right 08/09/2020   Procedure: RIGHT BREAST LUMPECTOMY WITH RADIOACTIVE SEED AND RIGHT BREAST RADIOACTIVE SEED TARGETED SENTINEL LYMPH NODE BIOPSY, RIGHT BREAST SENTINEL LYMPH NODE MAPPING;  Surgeon: Erroll Luna, MD;  Location: [facility];  Service: General;  Laterality: Right;  PEC BLOCK   BREAST SURGERY     CARDIAC CATHETERIZATION     CHOLECYSTECTOMY     DILATION AND CURETTAGE OF UTERUS     x1   HERNIA REPAIR     IR IMAGING GUIDED PORT INSERTION  08/29/2020   IR IMAGING GUIDED PORT INSERTION  08/29/2020   lumpectomy right breast     PORT-A-CATH REMOVAL N/A 04/05/2021   Procedure: PORT REMOVAL;  Surgeon: Erroll Luna, MD;  Location: [facility];  Service: General;  Laterality: N/A;    Social History   Socioeconomic History   Marital status: Married    Spouse name: Not on file  Number of children: Not on file   Years of education: Not on file   Highest education level: Not on file  Occupational History   Not on file  Tobacco Use   Smoking status: Never   Smokeless tobacco: Never  Vaping Use   Vaping Use: Never used  Substance and Sexual Activity   Alcohol use: No   Drug use: No   Sexual activity: Yes    Partners: Male    Birth control/protection: Pill  Other Topics Concern   Not on file  Social History  Narrative   Not on file   Social Determinants of Health   Financial Resource Strain: Low Risk  (06/01/2021)   Overall Financial Resource Strain (CARDIA)    Difficulty of Paying Living Expenses: Not hard at all  Food Insecurity: No Food Insecurity (06/01/2021)   Hunger Vital Sign    Worried About Running Out of Food in the Last Year: Never true    [place] in the Last Year: Never true  Transportation Needs: No Transportation Needs (06/01/2021)   PRAPARE - Hydrologist (Medical): No    Lack of Transportation (Non-Medical): No  Physical Activity: Inactive (06/01/2021)   Exercise Vital Sign    Days of Exercise per Week: 0 days    Minutes of Exercise per Session: 0 min  Stress: Stress Concern Present (06/01/2021)   [place]    Feeling of Stress : To some extent  Social Connections: Moderately Isolated (06/01/2021)   Social Connection and Isolation Panel [NHANES]    Frequency of Communication with Friends and Family: Three times a week    Frequency of Social Gatherings with Friends and Family: Twice a week    Attends Religious Services: Never    Marine scientist or Organizations: No    Attends Archivist Meetings: Never    Marital Status: Married  Human resources officer Violence: Not At Risk (06/01/2021)   Humiliation, Afraid, Rape, and Kick questionnaire    Fear of Current or Ex-Partner: No    Emotionally Abused: No    Physically Abused: No    Sexually Abused: No    Outpatient Encounter Medications as of 05/14/2022  Medication Sig   albuterol (VENTOLIN HFA) 108 (90 Base) MCG/ACT inhaler Inhale 2 puffs into the lungs every 6 (six) hours as needed for wheezing or shortness of breath.   predniSONE (DELTASONE) 20 MG tablet Take 2 tablets (40 mg total) by mouth daily with breakfast for 5 days.   benzonatate (TESSALON PERLES) 100 MG capsule Take 1 capsule (100 mg total) by mouth 3  (three) times daily as needed.   brompheniramine-pseudoephedrine-DM 30-2-10 MG/5ML syrup Take 5 mLs by mouth 4 (four) times daily as needed.   cetirizine (ZYRTEC) 10 MG tablet Take 1 tablet (10 mg total) by mouth daily.   fluticasone (FLONASE) 50 MCG/ACT nasal spray Place 2 sprays into both nostrils daily.   gabapentin (NEURONTIN) 100 MG capsule Take 1 capsule (100 mg total) by mouth 3 (three) times daily.   guaiFENesin (MUCINEX) 600 MG 12 hr tablet Take 1 tablet (600 mg total) by mouth 2 (two) times daily.   ibuprofen (ADVIL) 800 MG tablet Take 1 tablet (800 mg total) by mouth every 8 (eight) hours as needed.   letrozole (FEMARA) 2.5 MG tablet Take 1 tablet (2.5 mg total) by mouth daily.   metFORMIN (GLUCOPHAGE) 500 MG tablet Take 1 tablet (500 mg total)  by mouth 2 (two) times daily with a meal.   metoprolol tartrate (LOPRESSOR) 50 MG tablet Take 1 tablet (50 mg total) by mouth daily.   montelukast (SINGULAIR) 10 MG tablet Take 1 tablet (10 mg total) by mouth at bedtime.   mupirocin cream (BACTROBAN) 2 % Apply 1 application topically 2 (two) times daily.   mupirocin ointment (BACTROBAN) 2 % Apply 1 application. topically 2 (two) times daily.   neomycin-bacitracin-polymyxin (NEOSPORIN) [phone] ointment Apply 1 application topically in the morning and at bedtime.   nystatin (MYCOSTATIN/NYSTOP) powder Apply 1 application topically 2 (two) times daily.   pantoprazole (PROTONIX) 40 MG tablet Take 1 tablet (40 mg total) by mouth at bedtime.   No facility-administered encounter medications on file as of 05/14/2022.    Allergies  Allergen Reactions   Codeine Swelling   Grapefruit Extract     Can't have anything with grapefruit due to her cancer treatments   Peach Flavor Other (See Comments)    Severe Migraines    Review of Systems  Constitutional:  Negative for activity change, appetite change, chills, diaphoresis, fatigue, fever and unexpected weight change.  HENT:  Positive for  congestion, postnasal drip and rhinorrhea. Negative for dental problem, drooling, ear discharge, ear pain, facial swelling, hearing loss, mouth sores, nosebleeds, sinus pressure, sinus pain, sneezing, sore throat, tinnitus, trouble swallowing and voice change.   Eyes:  Negative for photophobia and visual disturbance.  Respiratory:  Positive for cough and wheezing. Negative for apnea, hemoptysis, sputum production, choking, chest tightness, shortness of breath and stridor.   Cardiovascular:  Negative for chest pain, palpitations and leg swelling.  Gastrointestinal:  Negative for abdominal pain, diarrhea and vomiting.  Genitourinary:  Negative for decreased urine volume and difficulty urinating.  Musculoskeletal:  Negative for neck pain.  Skin:  Negative for rash.  Neurological:  Negative for weakness and headaches.  Psychiatric/Behavioral:  Negative for confusion.   All other systems reviewed and are negative.        Observations/Objective: No vital signs or physical exam, this was a virtual health encounter.  Pt alert and oriented, answers all questions appropriately, and able to speak in full sentences.    Assessment and Plan: Shelby [name] was seen today for wheezing.  Diagnoses and all orders for this visit:  URI with cough and congestion Exposure to respiratory syncytial virus (RSV) URI with cough and congestion. No indications of acute bacterial illness. Known exposure to RSV, disease process discussed in detail along with treatment management. Medications as prescribed. Report new, worsening, or persistent symptoms.  -     predniSONE (DELTASONE) 20 MG tablet; Take 2 tablets (40 mg total) by mouth daily with breakfast for 5 days. -     albuterol (VENTOLIN HFA) 108 (90 Base) MCG/ACT inhaler; Inhale 2 puffs into the lungs every 6 (six) hours as needed for wheezing or shortness of breath.     Follow Up Instructions: Return if symptoms worsen or fail to improve.    I discussed the  assessment and treatment plan with the patient. The patient was provided an opportunity to ask questions and all were answered. The patient agreed with the plan and demonstrated an understanding of the instructions.   The patient was advised to call back or seek an in-person evaluation if the symptoms worsen or if the condition fails to improve as anticipated.  The above assessment and management plan was discussed with the patient. The patient verbalized understanding of and has agreed to the management plan. Patient is  aware to call the clinic if they develop any new symptoms or if symptoms persist or worsen. Patient is aware when to return to the clinic for a follow-up visit. Patient educated on when it is appropriate to go to the emergency department.    I provided 15 minutes of time during this telephone encounter.   Monia Pouch, FNP-C [facility] [address] [place], [place] [address] [phone] 05/14/2022

## 2022-05-20 ENCOUNTER — Encounter: Payer: BC Managed Care – PPO | Admitting: Family Medicine

## 2022-05-20 NOTE — Progress Notes (Deleted)
    Subjective:    Patient ID: Shelby [name], female    DOB: [birth year], 51 y.o.   MRN: [id number]   HPI: [name] is a 51 y.o. female presenting for       10/12/2021    2:54 PM 06/01/2021    9:31 AM 08/21/2020    9:15 AM 07/11/2020   12:42 PM 07/11/2020   11:13 AM  Depression screen PHQ 2/9  Decreased Interest 0 '1 1 2 '$ 0  Down, Depressed, Hopeless 0 0 0 1 0  PHQ - 2 Score 0 '1 1 3 '$ 0  Altered sleeping 0  0 1   Tired, decreased energy 0  0 2   Change in appetite 0  0 1   Feeling bad or failure about yourself  0  0 0   Trouble concentrating 0  0 0   Moving slowly or fidgety/restless 0  0 0   Suicidal thoughts 0  0 0   PHQ-9 Score 0  1 7   Difficult doing work/chores Not difficult at all  Not difficult at all Not difficult at all      Relevant past medical, surgical, family and social history reviewed and updated as indicated.  Interim medical history since our last visit reviewed. Allergies and medications reviewed and updated.  ROS:  Review of Systems   Social History   Tobacco Use  Smoking Status Never  Smokeless Tobacco Never       Objective:     Wt Readings from Last 3 Encounters:  05/03/22 (!) 418 lb 6.4 oz (189.8 kg)  04/02/22 (!) 416 lb (188.7 kg)  01/30/22 (!) 403 lb (182.8 kg)     Exam deferred. Pt. Harboring due to COVID 19. Phone visit performed.   Assessment & Plan:  No diagnosis found.  No orders of the defined types were placed in this encounter.   No orders of the defined types were placed in this encounter.     There are no diagnoses linked to this encounter.  Virtual Visit via telephone Note  I discussed the limitations, risks, security and privacy concerns of performing an evaluation and management service by telephone and the availability of in person appointments. The patient was identified with two identifiers. Pt.expressed understanding and agreed to proceed. Pt. Is at home. Dr. Livia Snellen is in his office.  Follow Up  Instructions:   I discussed the assessment and treatment plan with the patient. The patient was provided an opportunity to ask questions and all were answered. The patient agreed with the plan and demonstrated an understanding of the instructions.   The patient was advised to call back or seek an in-person evaluation if the symptoms worsen or if the condition fails to improve as anticipated.   Total minutes including chart review and phone contact time: ***   Follow up plan: No follow-ups on file.  Claretta Fraise, MD [place]

## 2022-05-20 NOTE — Progress Notes (Signed)
No answer 6:17, 6:28 & 6:35. Left movm ech time.  Erroneous encounter.  WS

## 2022-05-21 ENCOUNTER — Encounter: Payer: Self-pay | Admitting: Family Medicine

## 2022-05-21 ENCOUNTER — Ambulatory Visit (INDEPENDENT_AMBULATORY_CARE_PROVIDER_SITE_OTHER): Payer: BC Managed Care – PPO | Admitting: Family Medicine

## 2022-05-21 DIAGNOSIS — R300 Dysuria: Secondary | ICD-10-CM | POA: Diagnosis not present

## 2022-05-21 DIAGNOSIS — R3989 Other symptoms and signs involving the genitourinary system: Secondary | ICD-10-CM | POA: Diagnosis not present

## 2022-05-21 MED ORDER — NITROFURANTOIN MONOHYD MACRO 100 MG PO CAPS: 100.0000 mg | ORAL_CAPSULE | Freq: Two times a day (BID) | ORAL | 0 refills | Status: AC

## 2022-05-21 NOTE — Progress Notes (Signed)
Virtual Visit via telephone Note Due to COVID-19 pandemic this visit was conducted virtually. This visit type was conducted due to national recommendations for restrictions regarding the COVID-19 Pandemic (e.g. social distancing, sheltering in place) in an effort to limit this patient's exposure and mitigate transmission in our community. All issues noted in this document were discussed and addressed.  A physical exam was not performed with this format.   I connected with Shelby [name] on 05/21/2022 at 0800 by telephone and verified that I am speaking with the correct person using two identifiers. Shelby [name] is currently located at home and patient is currently with them during visit. The provider, Monia Pouch, FNP is located in their office at time of visit.  I discussed the limitations, risks, security and privacy concerns of performing an evaluation and management service by virtual visit and the availability of in person appointments. I also discussed with the patient that there may be a patient responsible charge related to this service. The patient expressed understanding and agreed to proceed.  Subjective:  Patient ID: Shelby [name], female    DOB: 12/07/1970, 51 y.o.   MRN: [id number]  Chief Complaint:  Dysuria   HPI: Shelby [name] is a 51 y.o. female presenting on 05/21/2022 for Dysuria   Pt reports dysuria, frequency, and urgency. States this started yesterday and is progressively getting worse. States she does not have transportation to come to the office. Denies fever, chills, weakness, or confusion. No hematuria, flank pain, or abdominal pain.   Dysuria  This is a new problem. The current episode started yesterday. The problem occurs every urination. The problem has been gradually worsening. The quality of the pain is described as aching and burning. The pain is moderate. There has been no fever. She is Not sexually active. There is No history of pyelonephritis. Associated  symptoms include frequency, hesitancy and urgency. Pertinent negatives include no chills, discharge, flank pain, hematuria, nausea, possible pregnancy, sweats or vomiting. She has tried increased fluids for the symptoms. The treatment provided no relief.     Relevant past medical, surgical, family, and social history reviewed and updated as indicated.  Allergies and medications reviewed and updated.   Past Medical History:  Diagnosis Date   Cancer ([place])    right breast   Complication of anesthesia    hard to wake up  per pt   Dyspnea    GERD (gastroesophageal reflux disease)    Heart palpitations    Obesity    Personal history of chemotherapy    Personal history of radiation therapy    Pre-diabetes     Past Surgical History:  Procedure Laterality Date   BREAST LUMPECTOMY     BREAST LUMPECTOMY WITH RADIOACTIVE SEED AND SENTINEL LYMPH NODE BIOPSY Right 08/09/2020   Procedure: RIGHT BREAST LUMPECTOMY WITH RADIOACTIVE SEED AND RIGHT BREAST RADIOACTIVE SEED TARGETED SENTINEL LYMPH NODE BIOPSY, RIGHT BREAST SENTINEL LYMPH NODE MAPPING;  Surgeon: Erroll Luna, MD;  Location: [facility];  Service: General;  Laterality: Right;  PEC BLOCK   BREAST SURGERY     CARDIAC CATHETERIZATION     CHOLECYSTECTOMY     DILATION AND CURETTAGE OF UTERUS     x1   HERNIA REPAIR     IR IMAGING GUIDED PORT INSERTION  08/29/2020   IR IMAGING GUIDED PORT INSERTION  08/29/2020   lumpectomy right breast     PORT-A-CATH REMOVAL N/A 04/05/2021   Procedure: PORT REMOVAL;  Surgeon: Erroll Luna, MD;  Location:  [facility];  Service: General;  Laterality: N/A;    Social History   Socioeconomic History   Marital status: Married    Spouse name: Not on file   Number of children: Not on file   Years of education: Not on file   Highest education level: Not on file  Occupational History   Not on file  Tobacco Use   Smoking status: Never   Smokeless tobacco: Never  Vaping Use   Vaping Use: Never used   Substance and Sexual Activity   Alcohol use: No   Drug use: No   Sexual activity: Yes    Partners: Male    Birth control/protection: Pill  Other Topics Concern   Not on file  Social History Narrative   Not on file   Social Determinants of Health   Financial Resource Strain: Low Risk  (06/01/2021)   Overall Financial Resource Strain (CARDIA)    Difficulty of Paying Living Expenses: Not hard at all  Food Insecurity: No Food Insecurity (06/01/2021)   Hunger Vital Sign    Worried About Running Out of Food in the Last Year: Never true    [place] in the Last Year: Never true  Transportation Needs: No Transportation Needs (06/01/2021)   PRAPARE - Hydrologist (Medical): No    Lack of Transportation (Non-Medical): No  Physical Activity: Inactive (06/01/2021)   Exercise Vital Sign    Days of Exercise per Week: 0 days    Minutes of Exercise per Session: 0 min  Stress: Stress Concern Present (06/01/2021)   [place]    Feeling of Stress : To some extent  Social Connections: Moderately Isolated (06/01/2021)   Social Connection and Isolation Panel [NHANES]    Frequency of Communication with Friends and Family: Three times a week    Frequency of Social Gatherings with Friends and Family: Twice a week    Attends Religious Services: Never    Marine scientist or Organizations: No    Attends Archivist Meetings: Never    Marital Status: Married  Human resources officer Violence: Not At Risk (06/01/2021)   Humiliation, Afraid, Rape, and Kick questionnaire    Fear of Current or Ex-Partner: No    Emotionally Abused: No    Physically Abused: No    Sexually Abused: No    Outpatient Encounter Medications as of 05/21/2022  Medication Sig   nitrofurantoin, macrocrystal-monohydrate, (MACROBID) 100 MG capsule Take 1 capsule (100 mg total) by mouth 2 (two) times daily for 5 days. 1 po BId    abemaciclib (VERZENIO) 100 MG tablet Take 1 tablet (100 mg total) by mouth 2 (two) times daily. Swallow tablets whole. Do not chew, crush, or split tablets before swallowing.   albuterol (VENTOLIN HFA) 108 (90 Base) MCG/ACT inhaler Inhale 2 puffs into the lungs every 6 (six) hours as needed for wheezing or shortness of breath.   benzonatate (TESSALON PERLES) 100 MG capsule Take 1 capsule (100 mg total) by mouth 3 (three) times daily as needed.   brompheniramine-pseudoephedrine-DM 30-2-10 MG/5ML syrup Take 5 mLs by mouth 4 (four) times daily as needed.   cetirizine (ZYRTEC) 10 MG tablet Take 1 tablet (10 mg total) by mouth daily.   fluticasone (FLONASE) 50 MCG/ACT nasal spray Place 2 sprays into both nostrils daily.   gabapentin (NEURONTIN) 100 MG capsule Take 1 capsule (100 mg total) by mouth 3 (three) times daily.   guaiFENesin (  MUCINEX) 600 MG 12 hr tablet Take 1 tablet (600 mg total) by mouth 2 (two) times daily.   ibuprofen (ADVIL) 800 MG tablet Take 1 tablet (800 mg total) by mouth every 8 (eight) hours as needed.   letrozole (FEMARA) 2.5 MG tablet Take 1 tablet (2.5 mg total) by mouth daily.   metFORMIN (GLUCOPHAGE) 500 MG tablet Take 1 tablet (500 mg total) by mouth 2 (two) times daily with a meal.   metoprolol tartrate (LOPRESSOR) 50 MG tablet Take 1 tablet (50 mg total) by mouth daily.   montelukast (SINGULAIR) 10 MG tablet Take 1 tablet (10 mg total) by mouth at bedtime.   mupirocin cream (BACTROBAN) 2 % Apply 1 application topically 2 (two) times daily.   mupirocin ointment (BACTROBAN) 2 % Apply 1 application. topically 2 (two) times daily.   neomycin-bacitracin-polymyxin (NEOSPORIN) [phone] ointment Apply 1 application topically in the morning and at bedtime.   nystatin (MYCOSTATIN/NYSTOP) powder Apply 1 application topically 2 (two) times daily.   pantoprazole (PROTONIX) 40 MG tablet Take 1 tablet (40 mg total) by mouth at bedtime.   No facility-administered encounter medications  on file as of 05/21/2022.    Allergies  Allergen Reactions   Codeine Swelling   Grapefruit Extract     Can't have anything with grapefruit due to her cancer treatments   Peach Flavor Other (See Comments)    Severe Migraines    Review of Systems  Constitutional:  Negative for activity change, appetite change, chills, diaphoresis, fatigue, fever and unexpected weight change.  HENT: Negative.    Eyes: Negative.   Respiratory:  Negative for cough, chest tightness and shortness of breath.   Cardiovascular:  Negative for chest pain, palpitations and leg swelling.  Gastrointestinal:  Negative for abdominal pain, blood in stool, constipation, diarrhea, nausea and vomiting.  Endocrine: Negative.   Genitourinary:  Positive for dysuria, frequency, hesitancy and urgency. Negative for decreased urine volume, difficulty urinating, flank pain, hematuria, vaginal bleeding and vaginal discharge.  Musculoskeletal:  Negative for arthralgias, back pain and myalgias.  Skin: Negative.   Allergic/Immunologic: Negative.   Neurological:  Negative for dizziness, weakness and headaches.  Hematological: Negative.   Psychiatric/Behavioral:  Negative for confusion, hallucinations, sleep disturbance and suicidal ideas.   All other systems reviewed and are negative.        Observations/Objective: No vital signs or physical exam, this was a virtual health encounter.  Pt alert and oriented, answers all questions appropriately, and able to speak in full sentences.    Assessment and Plan: Shelby [name] was seen today for dysuria.  Diagnoses and all orders for this visit:  Dysuria Suspected UTI Unable to come to office to provide sample. Will empirically treat with below. Pt aware if symptoms worsen or persist, she will need to be seen in office.  -     nitrofurantoin, macrocrystal-monohydrate, (MACROBID) 100 MG capsule; Take 1 capsule (100 mg total) by mouth 2 (two) times daily for 5 days. 1 po BId     Follow  Up Instructions: Return if symptoms worsen or fail to improve.    I discussed the assessment and treatment plan with the patient. The patient was provided an opportunity to ask questions and all were answered. The patient agreed with the plan and demonstrated an understanding of the instructions.   The patient was advised to call back or seek an in-person evaluation if the symptoms worsen or if the condition fails to improve as anticipated.  The above assessment and management plan was  discussed with the patient. The patient verbalized understanding of and has agreed to the management plan. Patient is aware to call the clinic if they develop any new symptoms or if symptoms persist or worsen. Patient is aware when to return to the clinic for a follow-up visit. Patient educated on when it is appropriate to go to the emergency department.    I provided 13 minutes of time during this telephone encounter.   Monia Pouch, FNP-C [facility] [address] [place], [place] [address] [phone] 05/21/2022

## 2022-05-28 ENCOUNTER — Other Ambulatory Visit (HOSPITAL_COMMUNITY): Payer: Self-pay

## 2022-05-30 ENCOUNTER — Other Ambulatory Visit (HOSPITAL_COMMUNITY): Payer: Self-pay

## 2022-06-11 ENCOUNTER — Other Ambulatory Visit (HOSPITAL_COMMUNITY): Payer: Self-pay

## 2022-06-14 ENCOUNTER — Other Ambulatory Visit (HOSPITAL_COMMUNITY): Payer: Self-pay

## 2022-06-18 ENCOUNTER — Other Ambulatory Visit: Payer: Self-pay

## 2022-06-18 ENCOUNTER — Other Ambulatory Visit (HOSPITAL_COMMUNITY): Payer: Self-pay

## 2022-06-19 ENCOUNTER — Other Ambulatory Visit: Payer: Self-pay

## 2022-06-21 ENCOUNTER — Other Ambulatory Visit: Payer: Self-pay | Admitting: Hematology and Oncology

## 2022-06-21 DIAGNOSIS — Z9889 Other specified postprocedural states: Secondary | ICD-10-CM

## 2022-06-29 DIAGNOSIS — R21 Rash and other nonspecific skin eruption: Secondary | ICD-10-CM | POA: Diagnosis not present

## 2022-06-29 DIAGNOSIS — L089 Local infection of the skin and subcutaneous tissue, unspecified: Secondary | ICD-10-CM | POA: Diagnosis not present

## 2022-07-04 ENCOUNTER — Other Ambulatory Visit: Payer: Self-pay | Admitting: Nurse Practitioner

## 2022-07-04 DIAGNOSIS — J309 Allergic rhinitis, unspecified: Secondary | ICD-10-CM

## 2022-07-11 ENCOUNTER — Other Ambulatory Visit (HOSPITAL_COMMUNITY): Payer: Self-pay

## 2022-07-12 ENCOUNTER — Telehealth: Payer: Self-pay | Admitting: Nurse Practitioner

## 2022-07-13 NOTE — Telephone Encounter (Signed)
Nothing for now unless she is having symptoms.

## 2022-07-15 ENCOUNTER — Other Ambulatory Visit (HOSPITAL_COMMUNITY): Payer: Self-pay

## 2022-07-15 ENCOUNTER — Telehealth: Payer: Self-pay | Admitting: Pharmacy Technician

## 2022-07-15 ENCOUNTER — Other Ambulatory Visit: Payer: Self-pay

## 2022-07-15 NOTE — Telephone Encounter (Addendum)
Oral Oncology Patient Advocate Encounter  Prior Authorization for Melynda Keller has been approved.    PA# [id number] Effective dates: 07/15/22 through 07/14/23  Patient may continue to fill at [facility].  Lady Deutscher, CPhT-Adv Oncology Pharmacy Patient [place] Direct Number: [phone]  Fax: [phone]

## 2022-07-15 NOTE — Telephone Encounter (Signed)
Oral Oncology Patient Advocate Encounter   Received notification that prior authorization for Verzenio is due for renewal.   PA submitted on 07/15/22 Key [id number] Status is pending     Lady Deutscher, CPhT-Adv Oncology Pharmacy Patient [place] Direct Number: [phone]  Fax: [phone]

## 2022-07-15 NOTE — Telephone Encounter (Signed)
Left message informing pt that there is not a medication to prevent her from developing the Flu.  Advised if she did develop symptoms to let us know and then Tamiflu would be warranted. Informed that this will only lessen her symptoms or shorten the course but not make it go away.  Advised pt to call the office back if needed.

## 2022-07-16 ENCOUNTER — Other Ambulatory Visit (HOSPITAL_COMMUNITY): Payer: Self-pay

## 2022-07-16 ENCOUNTER — Other Ambulatory Visit: Payer: Self-pay

## 2022-07-17 ENCOUNTER — Other Ambulatory Visit (HOSPITAL_COMMUNITY): Payer: Self-pay

## 2022-07-18 ENCOUNTER — Telehealth: Payer: BC Managed Care – PPO | Admitting: Family Medicine

## 2022-07-18 ENCOUNTER — Encounter: Payer: Self-pay | Admitting: Family Medicine

## 2022-07-18 DIAGNOSIS — J069 Acute upper respiratory infection, unspecified: Secondary | ICD-10-CM | POA: Diagnosis not present

## 2022-07-18 DIAGNOSIS — J9801 Acute bronchospasm: Secondary | ICD-10-CM

## 2022-07-18 DIAGNOSIS — J3489 Other specified disorders of nose and nasal sinuses: Secondary | ICD-10-CM | POA: Diagnosis not present

## 2022-07-18 MED ORDER — PREDNISONE 20 MG PO TABS: ORAL_TABLET | ORAL | 0 refills | Status: DC

## 2022-07-18 MED ORDER — ALBUTEROL SULFATE HFA 108 (90 BASE) MCG/ACT IN AERS: 2.0000 | INHALATION_SPRAY | Freq: Four times a day (QID) | RESPIRATORY_TRACT | 0 refills | Status: DC | PRN

## 2022-07-18 MED ORDER — PROMETHAZINE-DM 6.25-15 MG/5ML PO SYRP: 2.5000 mL | ORAL_SOLUTION | Freq: Four times a day (QID) | ORAL | 0 refills | Status: DC | PRN

## 2022-07-18 MED ORDER — CEFDINIR 300 MG PO CAPS: 300.0000 mg | ORAL_CAPSULE | Freq: Two times a day (BID) | ORAL | 0 refills | Status: DC

## 2022-07-18 MED ORDER — AZELASTINE HCL 0.1 % NA SOLN: 1.0000 | Freq: Two times a day (BID) | NASAL | 12 refills | Status: DC

## 2022-07-18 NOTE — Progress Notes (Signed)
Telephone visit  Subjective: BO:FBPZW PCP: Baruch Gouty, FNP CHE:NIDPO [name] is a 52 y.o. female. Patient provides verbal consent for consult held via telephone.  Due to COVID-19 pandemic this visit was conducted virtually. This visit type was conducted due to national recommendations for restrictions regarding the COVID-19 Pandemic (e.g. social distancing, sheltering in place) in an effort to limit this patient's exposure and mitigate transmission in our community. All issues noted in this document were discussed and addressed.  A physical exam was not performed with this format.   Location of patient: home Location of provider: [facility] Others present for call: none  1. Cough Patient reports that she developed wheezing, dry cough, rhinorrhea and loss of voice.  She reports sinus pressure.  She takes chemotherapy.  She reports her granddaughter was ill recently. Home COVID test was negative.  No purulence from nose.  She is taking tylenol.   ROS: Per HPI  Allergies  Allergen Reactions   Codeine Swelling   Grapefruit Extract     Can't have anything with grapefruit due to her cancer treatments   Peach Flavor Other (See Comments)    Severe Migraines   Past Medical History:  Diagnosis Date   Cancer ([place])    right breast   Complication of anesthesia    hard to wake up  per pt   Dyspnea    GERD (gastroesophageal reflux disease)    Heart palpitations    Obesity    Personal history of chemotherapy    Personal history of radiation therapy    Pre-diabetes     Current Outpatient Medications:    abemaciclib (VERZENIO) 100 MG tablet, Take 1 tablet (100 mg total) by mouth 2 (two) times daily. Swallow tablets whole. Do not chew, crush, or split tablets before swallowing., Disp: 56 tablet, Rfl: 3   albuterol (VENTOLIN HFA) 108 (90 Base) MCG/ACT inhaler, Inhale 2 puffs into the lungs every 6 (six) hours as needed for wheezing or shortness of breath., Disp: 8 g, Rfl: 0   benzonatate  (TESSALON PERLES) 100 MG capsule, Take 1 capsule (100 mg total) by mouth 3 (three) times daily as needed., Disp: 20 capsule, Rfl: 0   brompheniramine-pseudoephedrine-DM 30-2-10 MG/5ML syrup, Take 5 mLs by mouth 4 (four) times daily as needed., Disp: 120 mL, Rfl: 0   cetirizine (ZYRTEC) 10 MG tablet, Take 1 tablet (10 mg total) by mouth daily., Disp: 30 tablet, Rfl: 11   fluticasone (FLONASE) 50 MCG/ACT nasal spray, Place 2 sprays into both nostrils daily., Disp: 16 g, Rfl: 6   gabapentin (NEURONTIN) 100 MG capsule, Take 1 capsule (100 mg total) by mouth 3 (three) times daily., Disp: 90 capsule, Rfl: 3   guaiFENesin (MUCINEX) 600 MG 12 hr tablet, Take 1 tablet (600 mg total) by mouth 2 (two) times daily., Disp: 30 tablet, Rfl: 0   ibuprofen (ADVIL) 800 MG tablet, Take 1 tablet (800 mg total) by mouth every 8 (eight) hours as needed., Disp: 30 tablet, Rfl: 0   letrozole (FEMARA) 2.5 MG tablet, Take 1 tablet (2.5 mg total) by mouth daily., Disp: 90 tablet, Rfl: 3   metFORMIN (GLUCOPHAGE) 500 MG tablet, Take 1 tablet (500 mg total) by mouth 2 (two) times daily with a meal., Disp: 60 tablet, Rfl: 5   metoprolol tartrate (LOPRESSOR) 50 MG tablet, Take 1 tablet (50 mg total) by mouth daily., Disp: 30 tablet, Rfl: 5   montelukast (SINGULAIR) 10 MG tablet, TAKE ONE TABLET BY MOUTH AT BEDTIME, Disp: 90 tablet,  Rfl: 0   mupirocin cream (BACTROBAN) 2 %, Apply 1 application topically 2 (two) times daily., Disp: 15 g, Rfl: 2   mupirocin ointment (BACTROBAN) 2 %, Apply 1 application. topically 2 (two) times daily., Disp: , Rfl:    neomycin-bacitracin-polymyxin (NEOSPORIN) [phone] ointment, Apply 1 application topically in the morning and at bedtime., Disp: 28.3 g, Rfl: 1   nystatin (MYCOSTATIN/NYSTOP) powder, Apply 1 application topically 2 (two) times daily., Disp: 15 g, Rfl: 4   pantoprazole (PROTONIX) 40 MG tablet, Take 1 tablet (40 mg total) by mouth at bedtime., Disp: 30 tablet, Rfl: 5  Assessment/  Plan: 52 y.o. female   URI with cough and congestion - Plan: promethazine-dextromethorphan (PROMETHAZINE-DM) 6.25-15 MG/5ML syrup, cefdinir (OMNICEF) 300 MG capsule, albuterol (VENTOLIN HFA) 108 (90 Base) MCG/ACT inhaler  Rhinorrhea - Plan: azelastine (ASTELIN) 0.1 % nasal spray  Bronchospasm - Plan: cefdinir (OMNICEF) 300 MG capsule, albuterol (VENTOLIN HFA) 108 (90 Base) MCG/ACT inhaler, predniSONE (DELTASONE) 20 MG tablet  Suspect viral mediated illness, complicated by immunocompromised state from breast cancer treatment.  Offered PCR COVID test and flu test but patient will let us know if symptoms are not getting better with meds.  Pocket rx for Omnicef.  Discussed indication for use.  Red flags discussed, reasons for reevaluation discussed.  Follow up prn.  Start time: 12:53p End time: 1:01pm  Total time spent on patient care (including video visit/ documentation): 8 minutes  [place], [place] [phone]

## 2022-07-30 ENCOUNTER — Other Ambulatory Visit (HOSPITAL_COMMUNITY): Payer: Self-pay

## 2022-08-05 ENCOUNTER — Other Ambulatory Visit: Payer: BC Managed Care – PPO

## 2022-08-06 ENCOUNTER — Other Ambulatory Visit: Payer: Self-pay

## 2022-08-06 ENCOUNTER — Inpatient Hospital Stay: Payer: BC Managed Care – PPO | Attending: Hematology and Oncology | Admitting: Hematology and Oncology

## 2022-08-06 ENCOUNTER — Inpatient Hospital Stay: Payer: BC Managed Care – PPO

## 2022-08-06 VITALS — BP 144/85 | HR 78 | Temp 97.7°F | Resp 17 | Wt >= 6400 oz

## 2022-08-06 DIAGNOSIS — C773 Secondary and unspecified malignant neoplasm of axilla and upper limb lymph nodes: Secondary | ICD-10-CM | POA: Diagnosis not present

## 2022-08-06 DIAGNOSIS — G620 Drug-induced polyneuropathy: Secondary | ICD-10-CM | POA: Diagnosis not present

## 2022-08-06 DIAGNOSIS — Z170 Estrogen receptor positive status [ER+]: Secondary | ICD-10-CM | POA: Insufficient documentation

## 2022-08-06 DIAGNOSIS — Z79811 Long term (current) use of aromatase inhibitors: Secondary | ICD-10-CM | POA: Diagnosis not present

## 2022-08-06 DIAGNOSIS — C50111 Malignant neoplasm of central portion of right female breast: Secondary | ICD-10-CM | POA: Insufficient documentation

## 2022-08-06 LAB — CBC WITH DIFFERENTIAL (CANCER CENTER ONLY)
Abs Immature Granulocytes: 0.03 10*3/uL (ref 0.00–0.07)
Basophils Absolute: 0.1 10*3/uL (ref 0.0–0.1)
Basophils Relative: 1 %
Eosinophils Absolute: 0.2 10*3/uL (ref 0.0–0.5)
Eosinophils Relative: 3 %
HCT: 40.6 % (ref 36.0–46.0)
Hemoglobin: 14.1 g/dL (ref 12.0–15.0)
Immature Granulocytes: 0 %
Lymphocytes Relative: 36 %
Lymphs Abs: 2.6 10*3/uL (ref 0.7–4.0)
MCH: 32.2 pg (ref 26.0–34.0)
MCHC: 34.7 g/dL (ref 30.0–36.0)
MCV: 92.7 fL (ref 80.0–100.0)
Monocytes Absolute: 0.5 10*3/uL (ref 0.1–1.0)
Monocytes Relative: 6 %
Neutro Abs: 4 10*3/uL (ref 1.7–7.7)
Neutrophils Relative %: 54 %
Platelet Count: 213 10*3/uL (ref 150–400)
RBC: 4.38 MIL/uL (ref 3.87–5.11)
RDW: 13.4 % (ref 11.5–15.5)
WBC Count: 7.3 10*3/uL (ref 4.0–10.5)
nRBC: 0 % (ref 0.0–0.2)

## 2022-08-06 LAB — CMP (CANCER CENTER ONLY)
ALT: 18 U/L (ref 0–44)
AST: 17 U/L (ref 15–41)
Albumin: 3.8 g/dL (ref 3.5–5.0)
Alkaline Phosphatase: 88 U/L (ref 38–126)
Anion gap: 13 (ref 5–15)
BUN: 12 mg/dL (ref 6–20)
CO2: 23 mmol/L (ref 22–32)
Calcium: 9.3 mg/dL (ref 8.9–10.3)
Chloride: 105 mmol/L (ref 98–111)
Creatinine: 0.91 mg/dL (ref 0.44–1.00)
GFR, Estimated: >60 mL/min (ref >60)
Glucose, Bld: 117 mg/dL — ABNORMAL HIGH (ref 70–99)
Potassium: 4.2 mmol/L (ref 3.5–5.1)
Sodium: 141 mmol/L (ref 135–145)
Total Bilirubin: 0.5 mg/dL (ref 0.3–1.2)
Total Protein: 7 g/dL (ref 6.5–8.1)

## 2022-08-06 NOTE — Progress Notes (Signed)
Patient Care Team: Baruch Gouty, FNP as PCP - General (Family Medicine) Nicholas Lose, MD as Consulting Physician (Hematology and Oncology) Eppie Gibson, MD as Attending Physician (Radiation Oncology) Erroll Luna, MD as Consulting Physician (General Surgery)  DIAGNOSIS:  Encounter Diagnosis  Name Primary?   Malignant neoplasm of central portion of right breast in female, estrogen receptor positive ([place]) Yes    SUMMARY OF ONCOLOGIC HISTORY: Oncology History  Malignant neoplasm of central portion of right breast in female, estrogen receptor positive ([place])  07/04/2020 Initial Diagnosis   Screening mammogram detected right breast mass and right axillary lymph nodes.  Right breast nipple retraction and retroareolar thickening.  2.4 cm mass with 2 abnormal lymph nodes.  Biopsy revealed invasive lobular cancer grade 1-2 ER 95%, PR 95%, Ki-67 30%, HER2 equivocal by IHC negative by FISH ratio 1.39   07/17/2020 Cancer Staging   Staging form: Breast, AJCC 8th Edition - Clinical stage from 07/17/2020: Stage IIA (cT2, cN1, cM0, G2, ER+, PR+, HER2-) - Signed by Nicholas Lose, MD on 07/17/2020   08/09/2020 Surgery   Rght lumpectomy ([place]): invasive lobular carcinoma, 3.8cm, clear margins, 19/19 right axillary lymph nodes positive for metastatic carcinoma.    08/09/2020 Cancer Staging   Staging form: Breast, AJCC 8th Edition - Pathologic stage from 08/09/2020: Stage IIIA (pT2, pN3a, cM0, G2, ER+, PR+, HER2-) - Signed by Gardenia Phlegm, NP on 08/23/2020 Stage prefix: Initial diagnosis Histologic grading system: 3 grade system   09/15/2020 - 11/24/2020 Chemotherapy   Adjuvant chemotherapy with TC Q21D x4       01/25/2021 - 03/07/2021 Radiation Therapy   Adjuvant radiation   03/2021 -  Anti-estrogen oral therapy   Letrozole daily Added Verzinio on 06/27/21     CHIEF COMPLIANT: Follow-up on Verzinio   INTERVAL HISTORY: Shelby [name] is a 52 year old above-mentioned history of breast  cancer was currently on Verzinio on letrozole. She presents to the clinic for a follow-up. She reports that she went to [place] back in December and she was bite by a spider on her right arm 3 times. She said she had got really sick. She started taking Verzinio Dec. 28.23. Overall she says she has been doing good. She denies any diarrhea.  ALLERGIES:  is allergic to grapefruit extract, peach flavor, and codeine.  MEDICATIONS:  Current Outpatient Medications  Medication Sig Dispense Refill   abemaciclib (VERZENIO) 100 MG tablet Take 1 tablet (100 mg total) by mouth 2 (two) times daily. Swallow tablets whole. Do not chew, crush, or split tablets before swallowing. 56 tablet 3   albuterol (VENTOLIN HFA) 108 (90 Base) MCG/ACT inhaler Inhale 2 puffs into the lungs every 6 (six) hours as needed for wheezing or shortness of breath. 8 g 0   azelastine (ASTELIN) 0.1 % nasal spray Place 1 spray into both nostrils 2 (two) times daily. 30 mL 12   cetirizine (ZYRTEC) 10 MG tablet Take 1 tablet (10 mg total) by mouth daily. 30 tablet 11   fluticasone (FLONASE) 50 MCG/ACT nasal spray Place 2 sprays into both nostrils daily. 16 g 6   gabapentin (NEURONTIN) 100 MG capsule Take 1 capsule (100 mg total) by mouth 3 (three) times daily. 90 capsule 3   guaiFENesin (MUCINEX) 600 MG 12 hr tablet Take 1 tablet (600 mg total) by mouth 2 (two) times daily. 30 tablet 0   ibuprofen (ADVIL) 800 MG tablet Take 1 tablet (800 mg total) by mouth every 8 (eight) hours as needed. 30 tablet 0  letrozole (FEMARA) 2.5 MG tablet Take 1 tablet (2.5 mg total) by mouth daily. 90 tablet 3   metFORMIN (GLUCOPHAGE) 500 MG tablet Take 1 tablet (500 mg total) by mouth 2 (two) times daily with a meal. 60 tablet 5   metoprolol tartrate (LOPRESSOR) 50 MG tablet Take 1 tablet (50 mg total) by mouth daily. 30 tablet 5   montelukast (SINGULAIR) 10 MG tablet TAKE ONE TABLET BY MOUTH AT BEDTIME 90 tablet 0   mupirocin cream (BACTROBAN) 2 % Apply 1  application topically 2 (two) times daily. 15 g 2   mupirocin ointment (BACTROBAN) 2 % Apply 1 application. topically 2 (two) times daily.     neomycin-bacitracin-polymyxin (NEOSPORIN) [phone] ointment Apply 1 application topically in the morning and at bedtime. 28.3 g 1   nystatin (MYCOSTATIN/NYSTOP) powder Apply 1 application topically 2 (two) times daily. 15 g 4   pantoprazole (PROTONIX) 40 MG tablet Take 1 tablet (40 mg total) by mouth at bedtime. 30 tablet 5   predniSONE (DELTASONE) 20 MG tablet 2 po at same time daily for 3 days 6 tablet 0   promethazine-dextromethorphan (PROMETHAZINE-DM) 6.25-15 MG/5ML syrup Take 2.5 mLs by mouth 4 (four) times daily as needed for cough. 118 mL 0   No current facility-administered medications for this visit.    PHYSICAL EXAMINATION: ECOG PERFORMANCE STATUS: 1 - Symptomatic but completely ambulatory  Vitals:   08/06/22 1044  BP: (!) 144/85  Pulse: 78  Resp: 17  Temp: 97.7 F (36.5 C)  SpO2: 100%   Filed Weights   08/06/22 1044  Weight: (!) 429 lb 1 oz (194.6 kg)      LABORATORY DATA:  I have reviewed the data as listed    Latest Ref Rng & Units 08/06/2022   10:14 AM 05/03/2022    8:18 AM 01/30/2022    8:16 AM  CMP  Glucose 70 - 99 mg/dL 117  119  118   BUN 6 - 20 mg/dL '12  17  18   '$ Creatinine 0.44 - 1.00 mg/dL 0.91  0.91  0.86   Sodium 135 - 145 mmol/L 141  140  139   Potassium 3.5 - 5.1 mmol/L 4.2  4.3  4.0   Chloride 98 - 111 mmol/L 105  105  106   CO2 22 - 32 mmol/L '23  27  23   '$ Calcium 8.9 - 10.3 mg/dL 9.3  9.1  9.2   Total Protein 6.5 - 8.1 g/dL 7.0  7.0  7.2   Total Bilirubin 0.3 - 1.2 mg/dL 0.5  0.5  0.4   Alkaline Phos 38 - 126 U/L 88  92  88   AST 15 - 41 U/L '17  17  14   '$ ALT 0 - 44 U/L '18  16  18     '$ Lab Results  Component Value Date   WBC 7.3 08/06/2022   HGB 14.1 08/06/2022   HCT 40.6 08/06/2022   MCV 92.7 08/06/2022   PLT 213 08/06/2022   NEUTROABS 4.0 08/06/2022    ASSESSMENT & PLAN:  Malignant neoplasm  of central portion of right breast in female, estrogen receptor positive ([place]) 07/04/2020:Screening mammogram detected right breast mass and right axillary lymph nodes.  Right breast nipple retraction and retroareolar thickening.  2.4 cm mass with 2 abnormal lymph nodes.  Biopsy revealed invasive lobular cancer grade 1-2 ER 95%, PR 95%, Ki-67 30%, HER2 equivocal by IHC negative by FISH ratio 1.39 T2N1 stage IIA   Right lumpectomy: 08/09/2020: ILC 3.8  cm, 19/19 LN Positive, Margins Neg   Plan: 1. Staging scans 2. Adjuvant chemo with TC X 6 (because of her propensity for diabetes she may only end up getting 4) 3. XRT 01/25/2021- 03/07/21 4. Adj Anti estrogen therapy (with Verzenio) started December 2022 ------------------------------------------------------------------------------------------------------------------------------------ 08/30/2020: CT CAP: No evidence of metastatic disease 08/30/2020: Bone scan: No evidence of bone metastases --------------------------------------------------------------------------------------------------------------------  Anastrozole toxicities: Intermittent hot flashes Abemaciclib Toxicities: Denies any side effects, current dosage: 100 mg twice a day (started December 2022)    Breast Cancer Surveillance 1. Breast Exam: 01/30/2022: Benign 2. CT chest 07/19/21: Radiation fibrosis, Resolution of Rt Axillary LN, Hep steatosis 3. Mammogram 07/12/21: Benign Density Cat B   Obesity: She is working with a healthy weight loss clinic.  Her insurance denied weight loss injections. Fear of breast cancer recurrence: She is stressed out about recurrence of breast cancer.  We discussed this at length today. Chemo-induced peripheral neuropathy: Currently on gabapentin 200 mg 3 times daily.   Return to clinic in 3 months with labs and follow-up   Orders Placed This Encounter  Procedures   CBC with Differential ([facility] Only)    Standing Status:   Future    Standing  Expiration Date:   08/07/2023   CMP ([facility] only)    Standing Status:   Future    Standing Expiration Date:   08/07/2023   The patient has a good understanding of the overall plan. she agrees with it. she will call with any problems that may develop before the next visit here. Total time spent: 30 mins including face to face time and time spent for planning, charting and co-ordination of care   Harriette Ohara, MD 08/06/22    I Gardiner Coins am acting as a Education administrator for [employer]  I have reviewed the above documentation for accuracy and completeness, and I agree with the above.

## 2022-08-06 NOTE — Assessment & Plan Note (Signed)
07/04/2020:Screening mammogram detected right breast mass and right axillary lymph nodes.  Right breast nipple retraction and retroareolar thickening.  2.4 cm mass with 2 abnormal lymph nodes.  Biopsy revealed invasive lobular cancer grade 1-2 ER 95%, PR 95%, Ki-67 30%, HER2 equivocal by IHC negative by FISH ratio 1.39 T2N1 stage IIA   Right lumpectomy: 08/09/2020: ILC 3.8 cm, 19/19 LN Positive, Margins Neg   Plan: 1. Staging scans 2. Adjuvant chemo with TC X 6 (because of her propensity for diabetes she may only end up getting 4) 3. XRT 01/25/2021- 03/07/21 4. Adj Anti estrogen therapy (with Verzenio) started December 2022 ------------------------------------------------------------------------------------------------------------------------------------ 08/30/2020: CT CAP: No evidence of metastatic disease 08/30/2020: Bone scan: No evidence of bone metastases --------------------------------------------------------------------------------------------------------------------  Anastrozole toxicities: Intermittent hot flashes Abemaciclib Toxicities: Denies any side effects, current dosage: 100 mg twice a day (started December 2022)    Breast Cancer Surveillance 1. Breast Exam: 01/30/2022: Benign 2. CT chest 07/19/21: Radiation fibrosis, Resolution of Rt Axillary LN, Hep steatosis 3. Mammogram 07/12/21: Benign Density Cat B   Obesity: She is working with a healthy weight loss clinic.  Her insurance denied weight loss injections. Fear of breast cancer recurrence: She is stressed out about recurrence of breast cancer.  We discussed this at length today. Chemo-induced peripheral neuropathy: Currently on gabapentin 200 mg 3 times daily.   Return to clinic in 3 months with labs and follow-up

## 2022-08-07 ENCOUNTER — Other Ambulatory Visit (HOSPITAL_COMMUNITY): Payer: Self-pay

## 2022-08-12 ENCOUNTER — Other Ambulatory Visit: Payer: Self-pay

## 2022-08-13 ENCOUNTER — Other Ambulatory Visit (HOSPITAL_COMMUNITY): Payer: Self-pay

## 2022-08-14 ENCOUNTER — Other Ambulatory Visit: Payer: Self-pay

## 2022-08-26 ENCOUNTER — Other Ambulatory Visit (HOSPITAL_COMMUNITY): Payer: Self-pay

## 2022-08-28 ENCOUNTER — Ambulatory Visit
Admission: RE | Admit: 2022-08-28 | Discharge: 2022-08-28 | Disposition: A | Discharge status: Home / Self Care | Payer: BC Managed Care – PPO | Source: Ambulatory Visit | Attending: Hematology and Oncology | Admitting: Hematology and Oncology

## 2022-08-28 DIAGNOSIS — Z9889 Other specified postprocedural states: Secondary | ICD-10-CM

## 2022-08-28 DIAGNOSIS — R928 Other abnormal and inconclusive findings on diagnostic imaging of breast: Secondary | ICD-10-CM | POA: Diagnosis not present

## 2022-08-28 DIAGNOSIS — Z853 Personal history of malignant neoplasm of breast: Secondary | ICD-10-CM | POA: Diagnosis not present

## 2022-08-29 ENCOUNTER — Other Ambulatory Visit (HOSPITAL_COMMUNITY): Payer: Self-pay

## 2022-08-29 ENCOUNTER — Other Ambulatory Visit: Payer: Self-pay | Admitting: Hematology and Oncology

## 2022-08-29 ENCOUNTER — Other Ambulatory Visit: Payer: Self-pay

## 2022-08-29 MED ORDER — ABEMACICLIB 100 MG PO TABS
100.0000 mg | ORAL_TABLET | Freq: Two times a day (BID) | ORAL | 3 refills | Status: DC
  Filled 2022-08-29: qty 56, 28d supply, fill #0
  Filled 2022-10-01: qty 56, 28d supply, fill #1
  Filled 2022-10-30: qty 56, 28d supply, fill #2
  Filled 2022-11-26: qty 56, 28d supply, fill #3

## 2022-08-30 ENCOUNTER — Other Ambulatory Visit (HOSPITAL_COMMUNITY): Payer: Self-pay

## 2022-09-06 ENCOUNTER — Other Ambulatory Visit (HOSPITAL_COMMUNITY): Payer: Self-pay

## 2022-09-09 ENCOUNTER — Other Ambulatory Visit: Payer: Self-pay

## 2022-09-10 ENCOUNTER — Other Ambulatory Visit (HOSPITAL_COMMUNITY): Payer: Self-pay

## 2022-09-19 ENCOUNTER — Telehealth (INDEPENDENT_AMBULATORY_CARE_PROVIDER_SITE_OTHER): Payer: BC Managed Care – PPO | Admitting: Family Medicine

## 2022-09-19 ENCOUNTER — Encounter: Payer: Self-pay | Admitting: Family Medicine

## 2022-09-19 DIAGNOSIS — J069 Acute upper respiratory infection, unspecified: Secondary | ICD-10-CM

## 2022-09-19 DIAGNOSIS — R3989 Other symptoms and signs involving the genitourinary system: Secondary | ICD-10-CM

## 2022-09-19 DIAGNOSIS — R300 Dysuria: Secondary | ICD-10-CM | POA: Diagnosis not present

## 2022-09-19 MED ORDER — BENZONATATE 100 MG PO CAPS: 100.0000 mg | ORAL_CAPSULE | Freq: Three times a day (TID) | ORAL | 0 refills | Status: DC | PRN

## 2022-09-19 MED ORDER — AMOXICILLIN-POT CLAVULANATE 875-125 MG PO TABS: 1.0000 | ORAL_TABLET | Freq: Two times a day (BID) | ORAL | 0 refills | Status: AC

## 2022-09-19 NOTE — Progress Notes (Signed)
Virtual Visit via Video   I connected with patient on 09/19/22 at 1240 by a video enabled telemedicine application and verified that I am speaking with the correct person using two identifiers.  Location patient: Home Location provider: [place] participating in the virtual visit: Patient and Provider  I discussed the limitations of evaluation and management by telemedicine and the availability of in person appointments. The patient expressed understanding and agreed to proceed.  Subjective:   HPI:  Pt presents today for  Chief Complaint  Patient presents with   Dysuria   URI   Urinary Tract Infection: Patient complains of dysuria, frequency, nocturia, suprapubic pressure, and urgency She has had symptoms for 3 days. Patient also complains of congestion, cough, headache, and rhinitis. Patient denies back pain, fever, stomach ache, and vaginal discharge. Patient does not have a history of recurrent UTI.  Patient does not have a history of pyelonephritis.   Upper Respiratory Infection: Patient complains of symptoms of a URI, possible sinusitis. Symptoms include bilateral ear pain, congestion, coryza, cough, and plugged sensation in both ears. Onset of symptoms was several days ago, gradually worsening since that time. She also c/o nasal congestion, non productive cough, post nasal drip, and sinus pressure for the past 5 days .  She is drinking plenty of fluids. Evaluation to date: none. Treatment to date: none.    Review of Systems  HENT:  Positive for congestion, ear pain, sinus pain and sore throat.   Respiratory:  Positive for cough. Negative for hemoptysis, sputum production, shortness of breath and wheezing.   Cardiovascular:  Negative for chest pain and palpitations.  Gastrointestinal:  Negative for abdominal pain, constipation, diarrhea, heartburn, melena, nausea and vomiting.  Genitourinary:  Positive for dysuria, frequency and  urgency. Negative for flank pain and hematuria.  Neurological:  Positive for headaches.  All other systems reviewed and are negative.    Patient Active Problem List   Diagnosis Date Noted   Neuropathic pain 04/02/2022   Malignant neoplasm of central portion of right breast in female, estrogen receptor positive ([place]) 07/17/2020   Annual physical exam 07/11/2020   Wound check, abscess 07/11/2020   Allergic rhinitis 08/18/2017   Anxiety 08/18/2017   Nonhealing nonsurgical wound 05/13/2016   Gastroesophageal reflux disease without esophagitis 04/22/2016   Eustachian tube dysfunction, bilateral 04/22/2016   Moderate single current episode of major depressive disorder ([place]) 04/22/2016   Essential hypertension 03/19/2016   Hypertriglyceridemia 03/19/2016   Morbid obesity ([place]) 03/19/2016   Palpitations 03/19/2016    Social History   Tobacco Use   Smoking status: Never   Smokeless tobacco: Never  Substance Use Topics   Alcohol use: No    Current Outpatient Medications:    abemaciclib (VERZENIO) 100 MG tablet, Take 1 tablet (100 mg total) by mouth 2 (two) times daily. Swallow tablets whole. Do not chew, crush, or split tablets before swallowing., Disp: 56 tablet, Rfl: 3   albuterol (VENTOLIN HFA) 108 (90 Base) MCG/ACT inhaler, Inhale 2 puffs into the lungs every 6 (six) hours as needed for wheezing or shortness of breath., Disp: 8 g, Rfl: 0   amoxicillin-clavulanate (AUGMENTIN) 875-125 MG tablet, Take 1 tablet by mouth 2 (two) times daily for 10 days., Disp: 20 tablet, Rfl: 0   azelastine (ASTELIN) 0.1 % nasal spray, Place 1 spray into both nostrils 2 (two) times daily., Disp: 30 mL, Rfl: 12   cetirizine (ZYRTEC) 10 MG tablet, Take 1 tablet (10 mg total) by mouth  daily., Disp: 30 tablet, Rfl: 11   fluticasone (FLONASE) 50 MCG/ACT nasal spray, Place 2 sprays into both nostrils daily., Disp: 16 g, Rfl: 6   gabapentin (NEURONTIN) 100 MG capsule, Take 1 capsule (100 mg total) by mouth 3  (three) times daily., Disp: 90 capsule, Rfl: 3   guaiFENesin (MUCINEX) 600 MG 12 hr tablet, Take 1 tablet (600 mg total) by mouth 2 (two) times daily., Disp: 30 tablet, Rfl: 0   ibuprofen (ADVIL) 800 MG tablet, Take 1 tablet (800 mg total) by mouth every 8 (eight) hours as needed., Disp: 30 tablet, Rfl: 0   letrozole (FEMARA) 2.5 MG tablet, Take 1 tablet (2.5 mg total) by mouth daily., Disp: 90 tablet, Rfl: 3   metFORMIN (GLUCOPHAGE) 500 MG tablet, Take 1 tablet (500 mg total) by mouth 2 (two) times daily with a meal., Disp: 60 tablet, Rfl: 5   metoprolol tartrate (LOPRESSOR) 50 MG tablet, Take 1 tablet (50 mg total) by mouth daily., Disp: 30 tablet, Rfl: 5   montelukast (SINGULAIR) 10 MG tablet, TAKE ONE TABLET BY MOUTH AT BEDTIME, Disp: 90 tablet, Rfl: 0   mupirocin cream (BACTROBAN) 2 %, Apply 1 application topically 2 (two) times daily., Disp: 15 g, Rfl: 2   mupirocin ointment (BACTROBAN) 2 %, Apply 1 application. topically 2 (two) times daily., Disp: , Rfl:    neomycin-bacitracin-polymyxin (NEOSPORIN) [phone] ointment, Apply 1 application topically in the morning and at bedtime., Disp: 28.3 g, Rfl: 1   nystatin (MYCOSTATIN/NYSTOP) powder, Apply 1 application topically 2 (two) times daily., Disp: 15 g, Rfl: 4   pantoprazole (PROTONIX) 40 MG tablet, Take 1 tablet (40 mg total) by mouth at bedtime., Disp: 30 tablet, Rfl: 5   predniSONE (DELTASONE) 20 MG tablet, 2 po at same time daily for 3 days, Disp: 6 tablet, Rfl: 0   promethazine-dextromethorphan (PROMETHAZINE-DM) 6.25-15 MG/5ML syrup, Take 2.5 mLs by mouth 4 (four) times daily as needed for cough., Disp: 118 mL, Rfl: 0  Allergies  Allergen Reactions   Grapefruit Extract     Can't have anything with grapefruit due to her cancer treatments   Peach Flavor Other (See Comments)    Severe Migraines   Codeine Palpitations    Objective:  No vitals as this was a video visit.   Patient is well-developed, well-nourished in no acute  distress.  Resting comfortably at home.  Head is normocephalic, atraumatic.  No labored breathing.  Speech is clear and coherent with logical content.  Patient is alert and oriented at baseline.    Assessment and Plan:   Shelby [name] was seen today for dysuria and uri.  Diagnoses and all orders for this visit:  Dysuria Suspected UTI Symptoms consistent with UTI. Also has URI/sinusitis symptoms. Will treat with below for coverage of both. Pt aware to report new, worsening, or persistent symptoms. Increase water intake and avoid bladder irritants.  -     amoxicillin-clavulanate (AUGMENTIN) 875-125 MG tablet; Take 1 tablet by mouth 2 (two) times daily for 10 days.  URI with cough and congestion Will treat with below. Aware of symptomatic care at home. Report new, worsening, or persistent symptoms.  -     amoxicillin-clavulanate (AUGMENTIN) 875-125 MG tablet; Take 1 tablet by mouth 2 (two) times daily for 10 days.      Return if symptoms worsen or fail to improve.  Monia Pouch, FNP-C [facility] [address] [place], [place] [address] [phone]  09/19/2022  Time spent with the patient: 15 minutes,  of which >50% was spent in obtaining information about symptoms, reviewing previous labs, evaluations, and treatments, counseling about condition (please see the discussed topics above), and developing a plan to further investigate it; had a number of questions which I addressed.

## 2022-09-23 ENCOUNTER — Other Ambulatory Visit (HOSPITAL_COMMUNITY): Payer: Self-pay

## 2022-09-24 ENCOUNTER — Other Ambulatory Visit (HOSPITAL_COMMUNITY): Payer: Self-pay

## 2022-09-26 ENCOUNTER — Other Ambulatory Visit (HOSPITAL_COMMUNITY): Payer: Self-pay

## 2022-10-01 ENCOUNTER — Other Ambulatory Visit (HOSPITAL_COMMUNITY): Payer: Self-pay

## 2022-10-02 ENCOUNTER — Ambulatory Visit: Payer: BC Managed Care – PPO | Admitting: Family Medicine

## 2022-10-04 ENCOUNTER — Other Ambulatory Visit (HOSPITAL_COMMUNITY): Payer: Self-pay

## 2022-10-07 ENCOUNTER — Telehealth: Payer: Self-pay | Admitting: Family Medicine

## 2022-10-07 NOTE — Telephone Encounter (Signed)
Pt called stating that she recently had a visit with PCP for sinus and UTI and was treated with antibiotics but was told to call the office if medicine did not help. Pt says the medicine did not work for her and still has all of the same symptoms. Wants to know if something else can be sent in for her? Pt uses [facility] in [name].

## 2022-10-08 ENCOUNTER — Other Ambulatory Visit (HOSPITAL_COMMUNITY): Payer: Self-pay

## 2022-10-08 NOTE — Telephone Encounter (Signed)
Patient aware and verbalizes understanding. 

## 2022-10-11 ENCOUNTER — Other Ambulatory Visit: Payer: BC Managed Care – PPO

## 2022-10-11 ENCOUNTER — Telehealth (INDEPENDENT_AMBULATORY_CARE_PROVIDER_SITE_OTHER): Payer: BC Managed Care – PPO | Admitting: Family Medicine

## 2022-10-11 ENCOUNTER — Encounter: Payer: Self-pay | Admitting: Family Medicine

## 2022-10-11 DIAGNOSIS — N3000 Acute cystitis without hematuria: Secondary | ICD-10-CM

## 2022-10-11 DIAGNOSIS — R3989 Other symptoms and signs involving the genitourinary system: Secondary | ICD-10-CM | POA: Diagnosis not present

## 2022-10-11 LAB — URINALYSIS, ROUTINE W REFLEX MICROSCOPIC
Bilirubin, UA: NEGATIVE
Glucose, UA: NEGATIVE
Ketones, UA: NEGATIVE
Nitrite, UA: NEGATIVE
Specific Gravity, UA: 1.02 (ref 1.005–1.030)
Urobilinogen, Ur: 0.2 mg/dL (ref 0.2–1.0)
pH, UA: 5.5 (ref 5.0–7.5)

## 2022-10-11 LAB — MICROSCOPIC EXAMINATION: WBC, UA: >30 /hpf — AB (ref 0–5)

## 2022-10-11 MED ORDER — CEFDINIR 300 MG PO CAPS: 300.0000 mg | ORAL_CAPSULE | Freq: Two times a day (BID) | ORAL | 0 refills | Status: DC

## 2022-10-11 MED ORDER — FLUCONAZOLE 150 MG PO TABS
150.0000 mg | ORAL_TABLET | Freq: Once | ORAL | 0 refills | Status: AC
Start: 2022-10-11 — End: 2022-10-11

## 2022-10-11 NOTE — Progress Notes (Signed)
MyChart Video visit  Subjective: CC:UTI PCP: Sonny Masters, FNP PNP:YYFRT Shelby [name] is a 52 y.o. female. Patient provides verbal consent for consult held via video.  Due to COVID-19 pandemic this visit was conducted virtually. This visit type was conducted due to national recommendations for restrictions regarding the COVID-19 Pandemic (e.g. social distancing, sheltering in place) in an effort to limit this patient's exposure and mitigate transmission in our community. All issues noted in this document were discussed and addressed.  A physical exam was not performed with this format.   Location of patient: home Location of provider: [facility] Others present for call: none  1.  URI/UTI Patient reports that she was seen for URI and suspected UTI a few weeks ago.  She was placed on Augmentin.  She notes that the URI still seems to be getting slightly better but she continues to have urinary frequency and urgency.  No dysuria or hematuria.  No flank pain.  No fevers or nausea or vomiting reported.   ROS: Per HPI  Allergies  Allergen Reactions   Grapefruit Extract     Can'Shelby have anything with grapefruit due to her cancer treatments   Peach Flavor Other (See Comments)    Severe Migraines   Codeine Palpitations   Past Medical History:  Diagnosis Date   Cancer ([facility])    right breast   Complication of anesthesia    hard to wake up  per pt   Dyspnea    GERD (gastroesophageal reflux disease)    Heart palpitations    Obesity    Personal history of chemotherapy    Personal history of radiation therapy    Pre-diabetes     Current Outpatient Medications:    abemaciclib (VERZENIO) 100 MG tablet, Take 1 tablet (100 mg total) by mouth 2 (two) times daily. Swallow tablets whole. Do not chew, crush, or split tablets before swallowing., Disp: 56 tablet, Rfl: 3   albuterol (VENTOLIN HFA) 108 (90 Base) MCG/ACT inhaler, Inhale 2 puffs into the lungs every 6 (six) hours as needed for wheezing or shortness  of breath., Disp: 8 g, Rfl: 0   azelastine (ASTELIN) 0.1 % nasal spray, Place 1 spray into both nostrils 2 (two) times daily., Disp: 30 mL, Rfl: 12   benzonatate (TESSALON PERLES) 100 MG capsule, Take 1 capsule (100 mg total) by mouth 3 (three) times daily as needed for cough., Disp: 20 capsule, Rfl: 0   cetirizine (ZYRTEC) 10 MG tablet, Take 1 tablet (10 mg total) by mouth daily., Disp: 30 tablet, Rfl: 11   fluticasone (FLONASE) 50 MCG/ACT nasal spray, Place 2 sprays into both nostrils daily., Disp: 16 g, Rfl: 6   gabapentin (NEURONTIN) 100 MG capsule, Take 1 capsule (100 mg total) by mouth 3 (three) times daily., Disp: 90 capsule, Rfl: 3   guaiFENesin (MUCINEX) 600 MG 12 hr tablet, Take 1 tablet (600 mg total) by mouth 2 (two) times daily., Disp: 30 tablet, Rfl: 0   ibuprofen (ADVIL) 800 MG tablet, Take 1 tablet (800 mg total) by mouth every 8 (eight) hours as needed., Disp: 30 tablet, Rfl: 0   letrozole (FEMARA) 2.5 MG tablet, Take 1 tablet (2.5 mg total) by mouth daily., Disp: 90 tablet, Rfl: 3   metFORMIN (GLUCOPHAGE) 500 MG tablet, Take 1 tablet (500 mg total) by mouth 2 (two) times daily with a meal., Disp: 60 tablet, Rfl: 5   metoprolol tartrate (LOPRESSOR) 50 MG tablet, Take 1 tablet (50 mg total) by mouth daily., Disp: 30  tablet, Rfl: 5   montelukast (SINGULAIR) 10 MG tablet, TAKE ONE TABLET BY MOUTH AT BEDTIME, Disp: 90 tablet, Rfl: 0   mupirocin cream (BACTROBAN) 2 %, Apply 1 application topically 2 (two) times daily., Disp: 15 g, Rfl: 2   mupirocin ointment (BACTROBAN) 2 %, Apply 1 application. topically 2 (two) times daily., Disp: , Rfl:    neomycin-bacitracin-polymyxin (NEOSPORIN) [phone] ointment, Apply 1 application topically in the morning and at bedtime., Disp: 28.3 g, Rfl: 1   nystatin (MYCOSTATIN/NYSTOP) powder, Apply 1 application topically 2 (two) times daily., Disp: 15 g, Rfl: 4   pantoprazole (PROTONIX) 40 MG tablet, Take 1 tablet (40 mg total) by mouth at bedtime., Disp:  30 tablet, Rfl: 5   predniSONE (DELTASONE) 20 MG tablet, 2 po at same time daily for 3 days, Disp: 6 tablet, Rfl: 0   promethazine-dextromethorphan (PROMETHAZINE-DM) 6.25-15 MG/5ML syrup, Take 2.5 mLs by mouth 4 (four) times daily as needed for cough., Disp: 118 mL, Rfl: 0  Gen: nontoxic female, NAD HEENT: sclera white, MMM Pulm: normal WOB on room air, no wheezing appreciated.  Assessment/ Plan: 52 y.o. female   Acute cystitis without hematuria - Plan: Urinalysis, Routine w reflex microscopic, Urine Culture, cefdinir (OMNICEF) 300 MG capsule, fluconazole (DIFLUCAN) 150 MG tablet  Omnicef.  Diflucan if needed for yeast infection.  Send for culture.  Push oral fluids.  Follow-up as needed  Start time: 11:33am (internet having connectivity issues); 11:54a End time: 11:58a  Total time spent on patient care (including video visit/ documentation): 6 minutes  Shelby Krall Hulen Skains, DO [facility] [phone]

## 2022-10-13 IMAGING — MG MM BREAST LOCALIZATION CLIP
6 series · 6 of 18 positions shown · non-contrast
Comparison: Previous exams.

CLINICAL DATA: Post ultrasound-guided biopsy of a mass in the
retroareolar right breast and a lymph node in the right axilla with
cortical thickening.

EXAM:
DIAGNOSTIC RIGHT MAMMOGRAM POST ULTRASOUND BIOPSY

[R CC synth-2D]
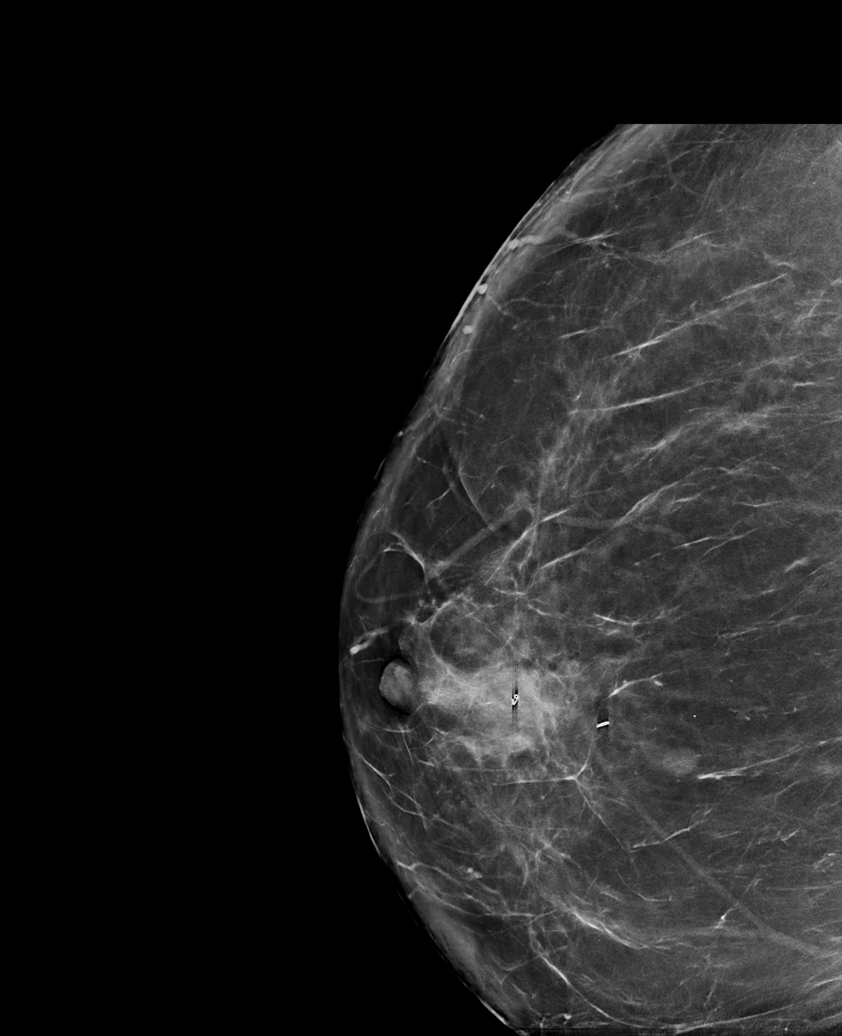

[R MLO synth-2D (1 of 2)]
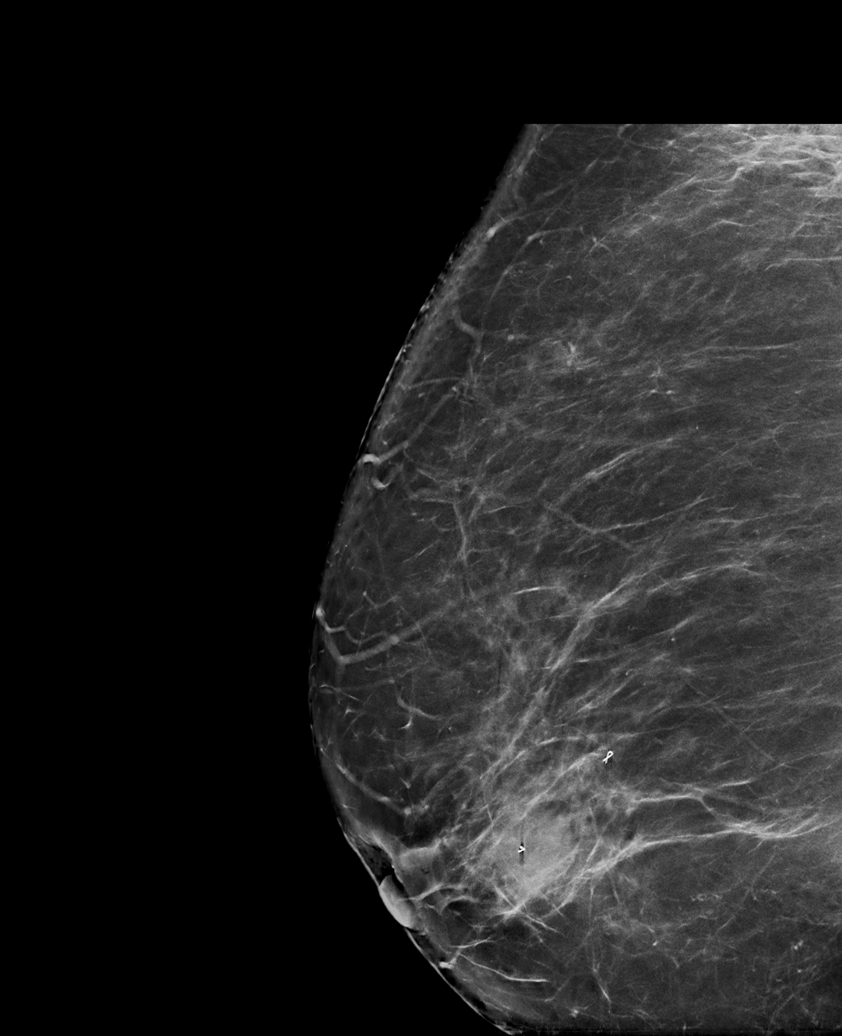

[R MLO synth-2D (2 of 2)]
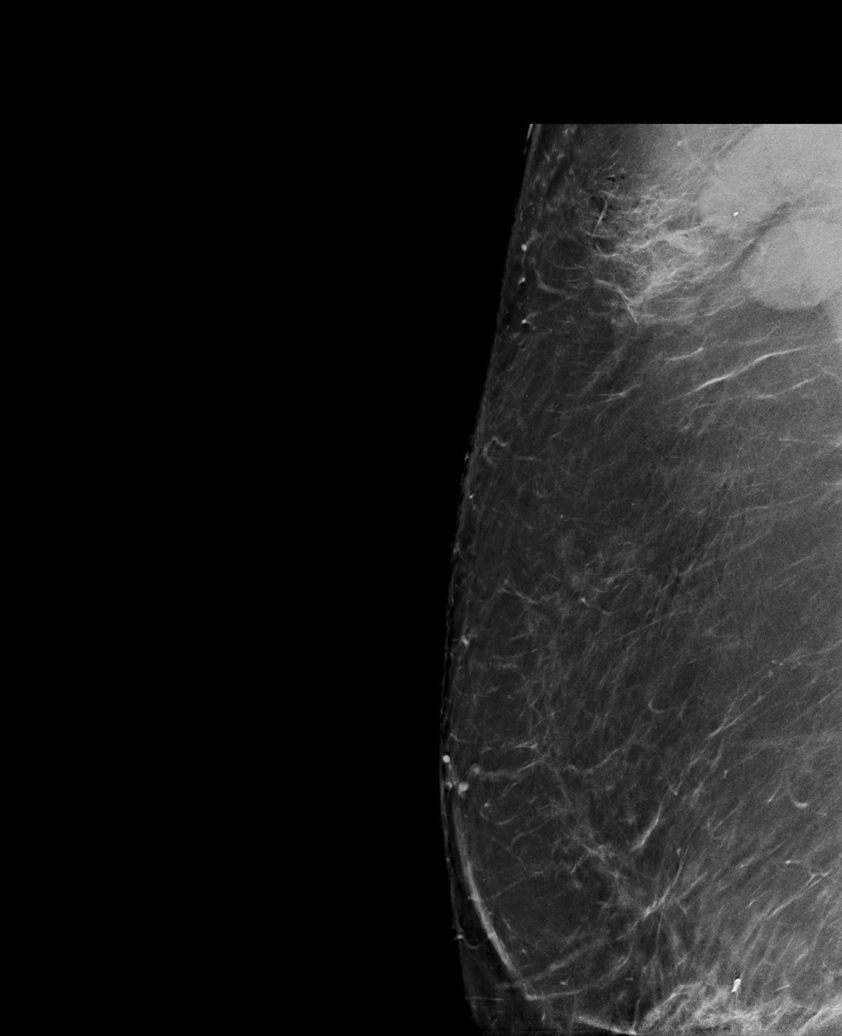

[R MLO tomo (1 of 2) · tomo slice 63/126.0]
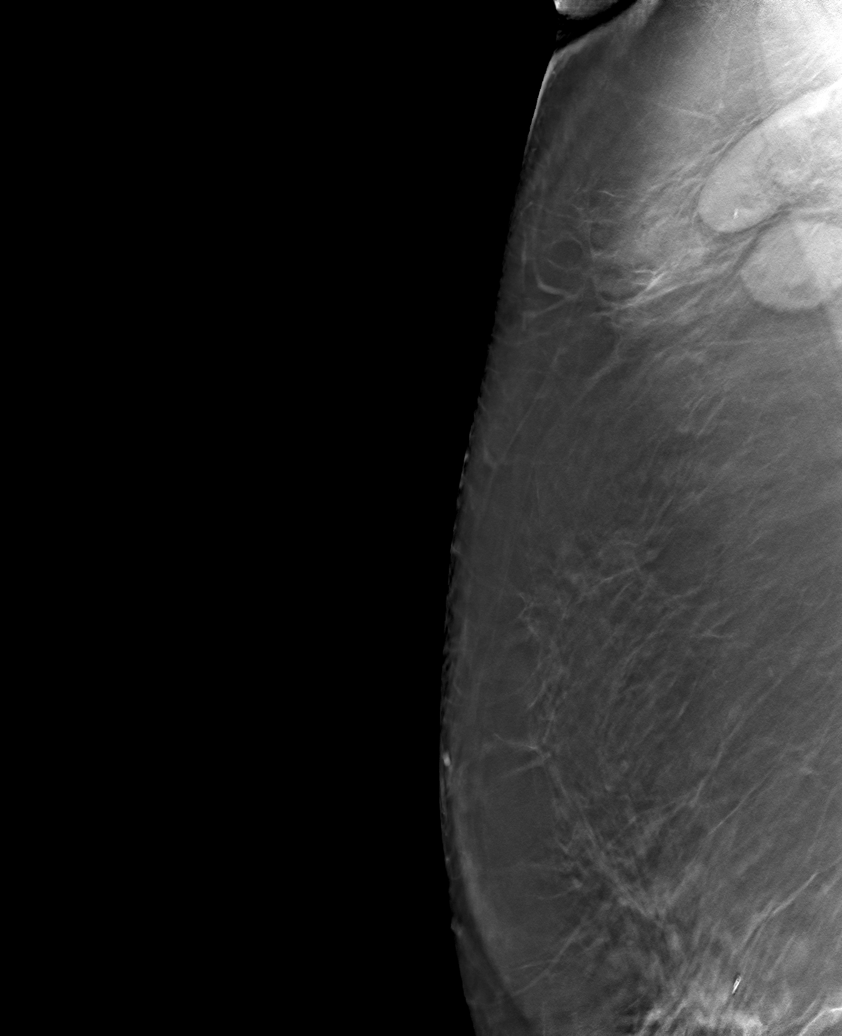

[R MLO tomo (2 of 2) · tomo slice 55/109.0]
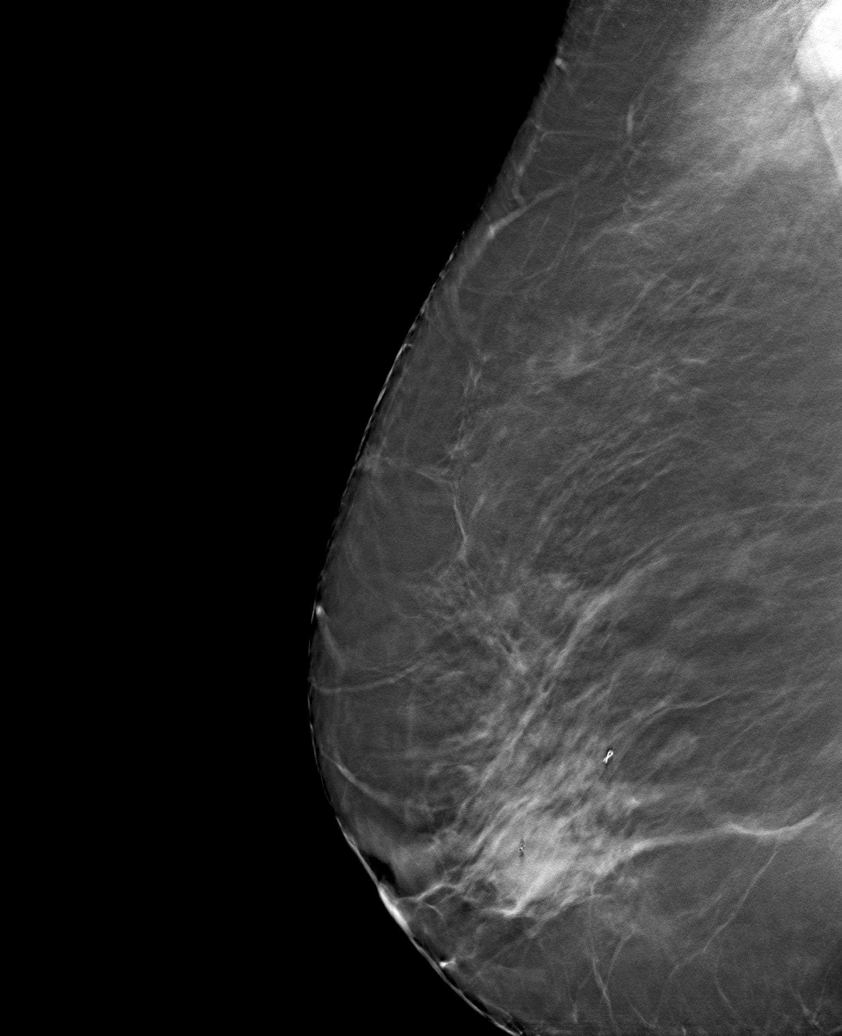

[R CC tomo · tomo slice 52/103.0]
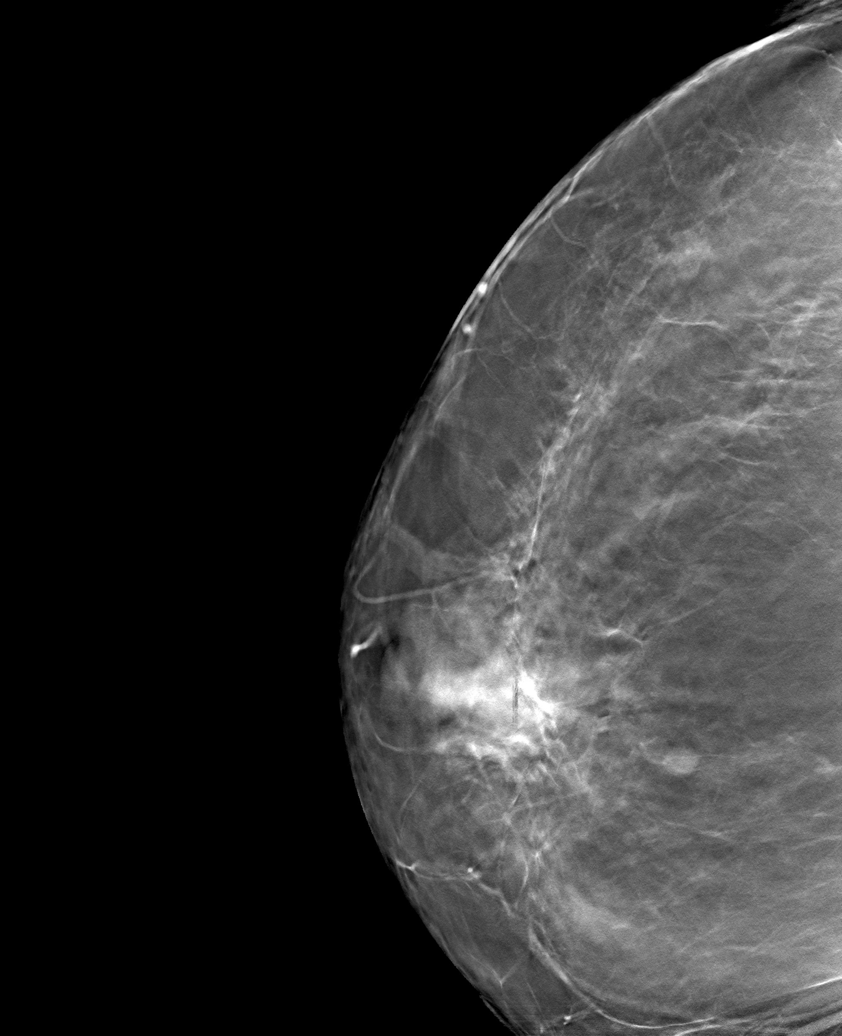

[6 of 18 positions shown; findings below may reference images not displayed]

FINDINGS: Mammographic images were obtained following ultrasound-guided biopsy
of a mass in the retroareolar right breast and a lymph node in the
right axilla with cortical thickening. A wing shaped biopsy marking
clip is present at the site of the biopsied mass in the retroareolar
right breast. A HydroMARK biopsy marking clip is present at the site
of the biopsied lymph node in the right axilla.
IMPRESSION: 1. Wing shaped biopsy marking clip at site of biopsied mass in the
retroareolar right breast.

2. HydroMARK biopsy marking clip at site of biopsied lymph node in
the right axilla.

Final Assessment: Post Procedure Mammograms for Marker Placement

## 2022-10-13 IMAGING — MG MM DIGITAL DIAGNOSTIC UNILAT*R* W/ TOMO W/ CAD
6 series · 6 of 18 positions shown · non-contrast
Comparison: Previous exams.

CLINICAL DATA: Screening recall for right breast mass and abnormal
right axillary lymph nodes.

EXAM:
DIGITAL DIAGNOSTIC UNILATERAL RIGHT MAMMOGRAM WITH TOMO AND CAD;
ULTRASOUND RIGHT BREAST LIMITED

[R CC synth-2D]
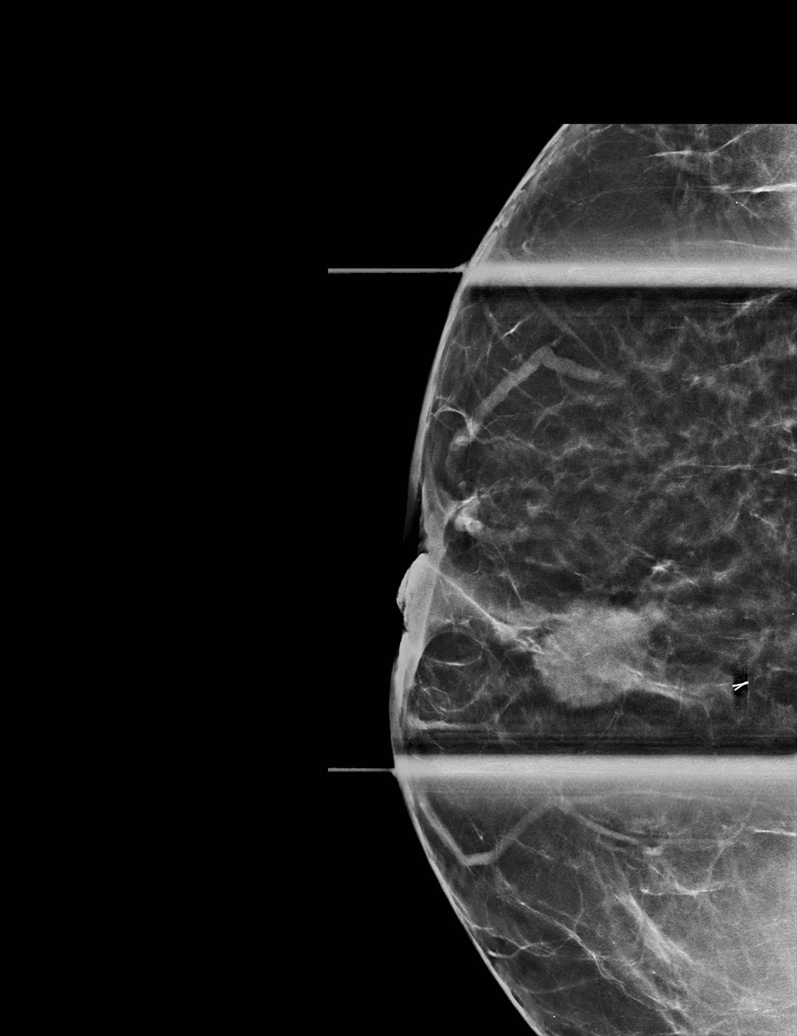

[R MLO synth-2D (1 of 2)]
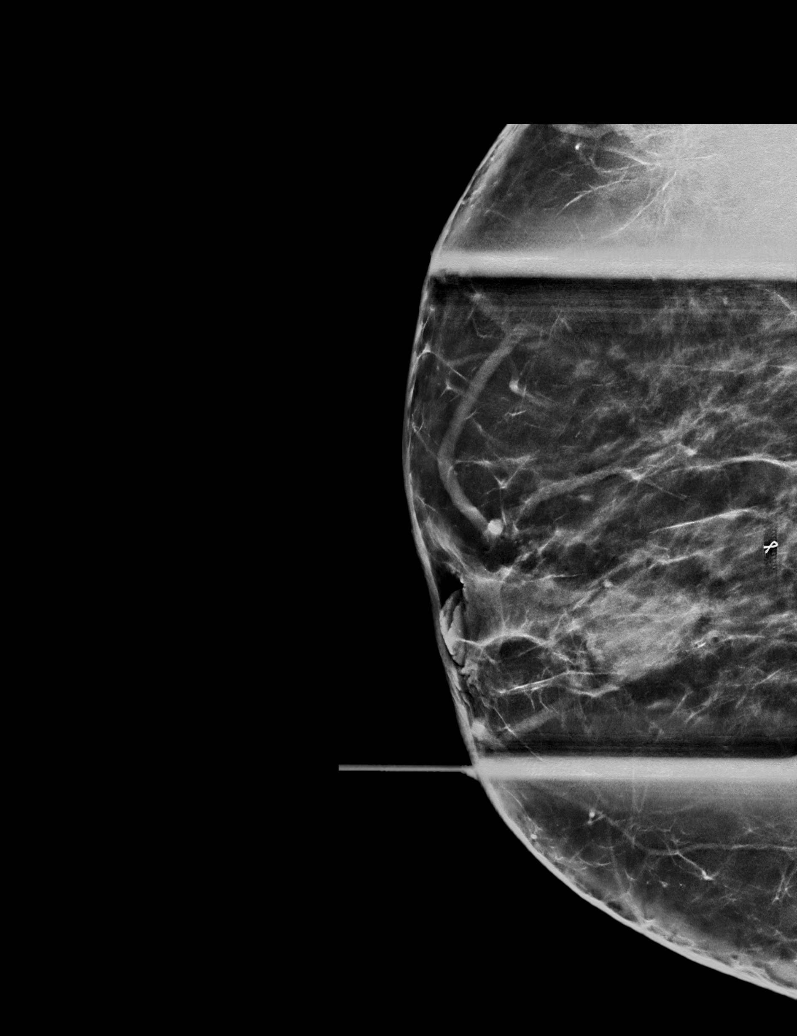

[R MLO synth-2D (2 of 2)]
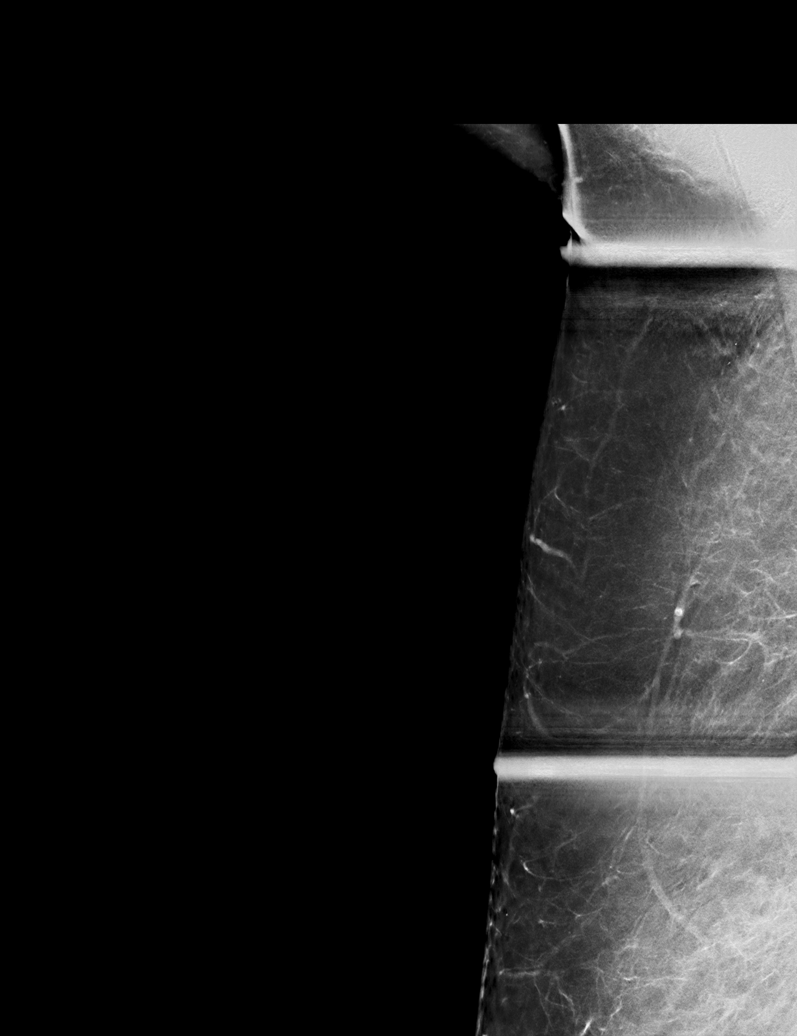

[R MLO tomo (1 of 2) · tomo slice 38/75.0]
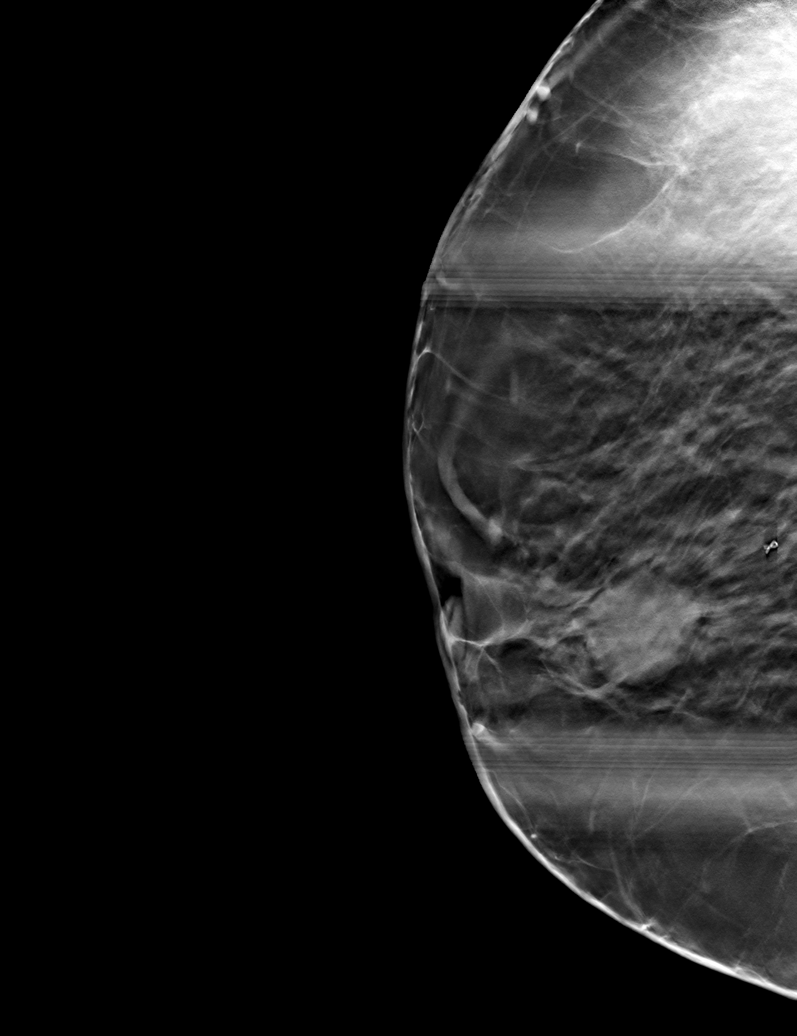

[R CC tomo · tomo slice 31/62.0]
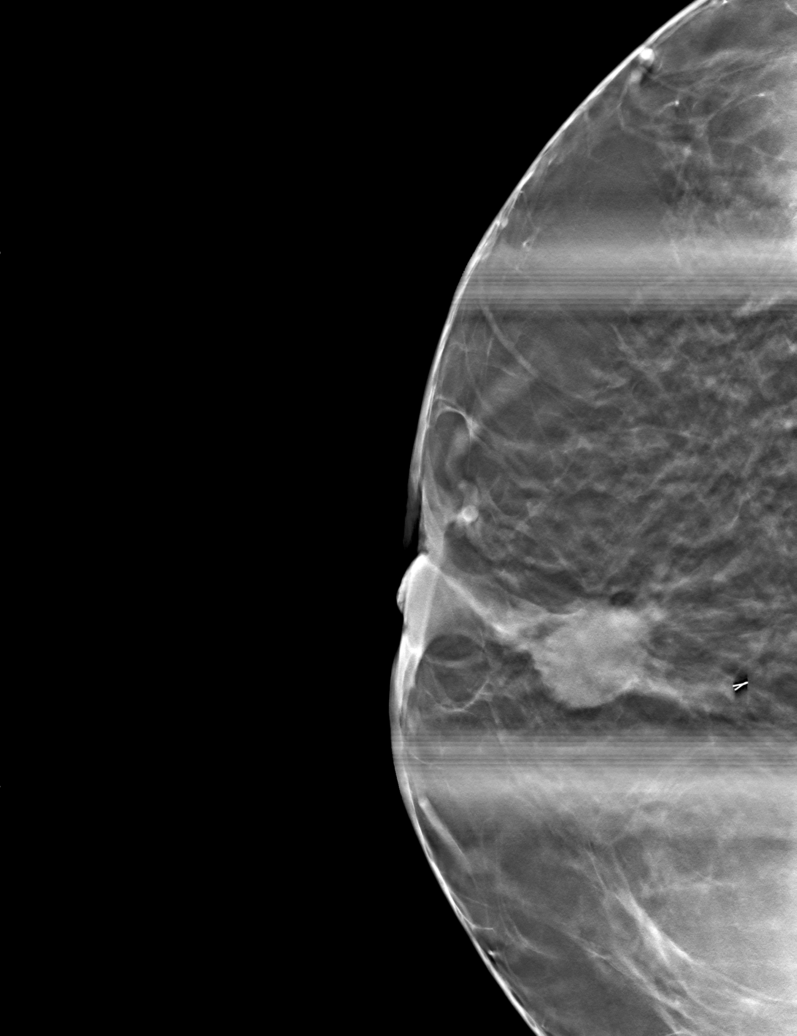

[R MLO tomo (2 of 2) · tomo slice 45/88.0]
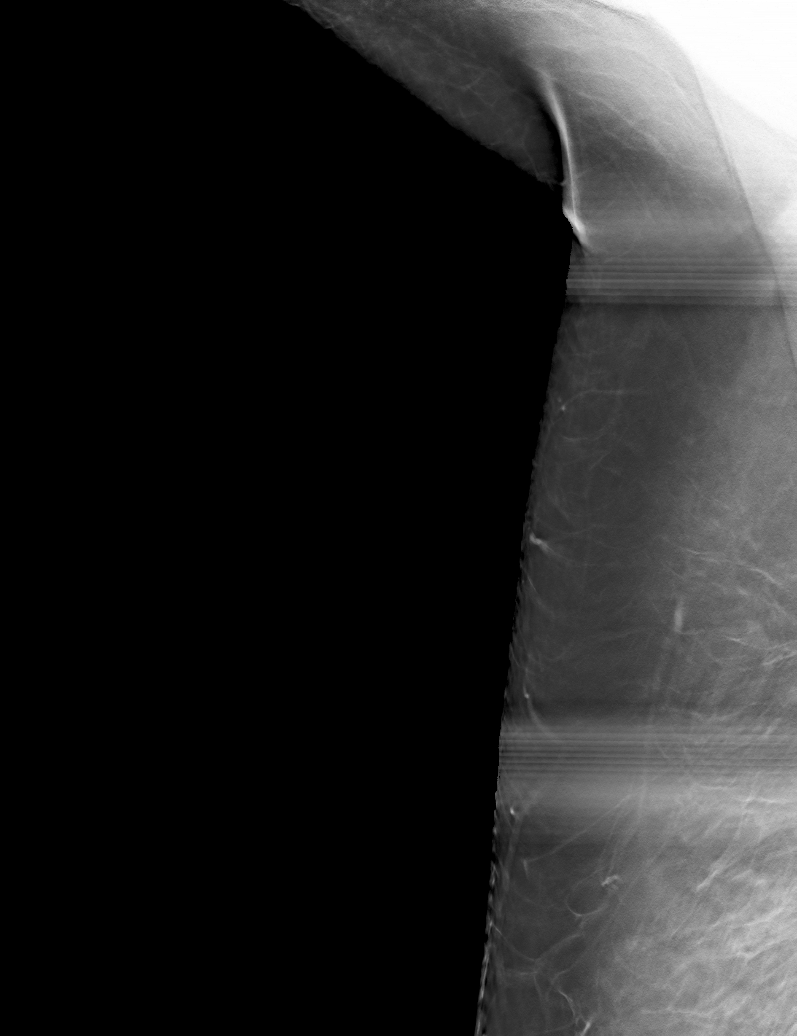

[6 of 18 positions shown; findings below may reference images not displayed]

ACR Breast Density Category b: There are scattered areas of
fibroglandular density.
FINDINGS: Spot compression tomograms were performed of the right breast. There
is an irregular mass in the retroareolar right breast with
associated nipple retraction, with the mass measuring approximately
2.4 cm. The abnormal lymph nodes seen on the initial screening
mammogram could not be included in the field of view on the spot
compression MLO tomograms.

Mammographic images were processed with CAD.

Physical examination reveals mild retraction/flattening of the right
nipple with associated palpable retroareolar thickening.

Targeted ultrasound of the right breast was performed. There is an
irregular somewhat ill-defined mixed echogenicity predominantly
hypoechoic mass in the retroareolar right breast measuring 2.4 x
x 1.3 cm.

There are 2 morphologically abnormal lymph nodes in the right axilla
with the more superficial lymph node measuring up to 1.9 cm, with a
cortex thickness of 1.1 cm.
IMPRESSION: 1. Suspicious 2.4 cm mass in the retroareolar right breast.

2. Suspicious morphologically abnormal lymph nodes in the right
axilla.

RECOMMENDATION:
1. Recommend ultrasound-guided biopsy of the mass in the
retroareolar right breast.

2. Recommend ultrasound guided biopsy of 1 of the abnormal lymph
nodes in the right axilla.

I have discussed the findings and recommendations with the patient.
If applicable, a reminder letter will be sent to the patient
regarding the next appointment.

BI-RADS CATEGORY  4: Suspicious.

## 2022-10-15 LAB — URINE CULTURE

## 2022-10-17 ENCOUNTER — Other Ambulatory Visit: Payer: Self-pay | Admitting: *Deleted

## 2022-10-17 DIAGNOSIS — N3001 Acute cystitis with hematuria: Secondary | ICD-10-CM | POA: Diagnosis not present

## 2022-10-17 DIAGNOSIS — F321 Major depressive disorder, single episode, moderate: Secondary | ICD-10-CM

## 2022-10-18 ENCOUNTER — Ambulatory Visit: Payer: BC Managed Care – PPO | Admitting: Family Medicine

## 2022-10-30 ENCOUNTER — Other Ambulatory Visit: Payer: Self-pay | Admitting: *Deleted

## 2022-10-30 ENCOUNTER — Telehealth: Payer: Self-pay

## 2022-10-30 ENCOUNTER — Other Ambulatory Visit: Payer: Self-pay

## 2022-10-30 ENCOUNTER — Inpatient Hospital Stay: Payer: BC Managed Care – PPO | Attending: Hematology and Oncology

## 2022-10-30 ENCOUNTER — Inpatient Hospital Stay (HOSPITAL_BASED_OUTPATIENT_CLINIC_OR_DEPARTMENT_OTHER): Payer: BC Managed Care – PPO | Admitting: Hematology and Oncology

## 2022-10-30 ENCOUNTER — Other Ambulatory Visit (HOSPITAL_COMMUNITY): Payer: Self-pay

## 2022-10-30 VITALS — BP 153/81 | HR 77 | Temp 97.5°F | Resp 18 | Ht 65.0 in

## 2022-10-30 DIAGNOSIS — R002 Palpitations: Secondary | ICD-10-CM | POA: Insufficient documentation

## 2022-10-30 DIAGNOSIS — J309 Allergic rhinitis, unspecified: Secondary | ICD-10-CM | POA: Insufficient documentation

## 2022-10-30 DIAGNOSIS — C773 Secondary and unspecified malignant neoplasm of axilla and upper limb lymph nodes: Secondary | ICD-10-CM | POA: Diagnosis not present

## 2022-10-30 DIAGNOSIS — R7303 Prediabetes: Secondary | ICD-10-CM | POA: Diagnosis not present

## 2022-10-30 DIAGNOSIS — Z170 Estrogen receptor positive status [ER+]: Secondary | ICD-10-CM | POA: Diagnosis not present

## 2022-10-30 DIAGNOSIS — C50111 Malignant neoplasm of central portion of right female breast: Secondary | ICD-10-CM | POA: Diagnosis not present

## 2022-10-30 DIAGNOSIS — K219 Gastro-esophageal reflux disease without esophagitis: Secondary | ICD-10-CM | POA: Diagnosis not present

## 2022-10-30 DIAGNOSIS — I10 Essential (primary) hypertension: Secondary | ICD-10-CM | POA: Insufficient documentation

## 2022-10-30 DIAGNOSIS — M792 Neuralgia and neuritis, unspecified: Secondary | ICD-10-CM

## 2022-10-30 DIAGNOSIS — Z79811 Long term (current) use of aromatase inhibitors: Secondary | ICD-10-CM | POA: Insufficient documentation

## 2022-10-30 DIAGNOSIS — E669 Obesity, unspecified: Secondary | ICD-10-CM | POA: Insufficient documentation

## 2022-10-30 DIAGNOSIS — G620 Drug-induced polyneuropathy: Secondary | ICD-10-CM | POA: Diagnosis not present

## 2022-10-30 LAB — CMP (CANCER CENTER ONLY)
ALT: 16 U/L (ref 0–44)
AST: 16 U/L (ref 15–41)
Albumin: 3.8 g/dL (ref 3.5–5.0)
Alkaline Phosphatase: 95 U/L (ref 38–126)
Anion gap: 8 (ref 5–15)
BUN: 17 mg/dL (ref 6–20)
CO2: 28 mmol/L (ref 22–32)
Calcium: 9.1 mg/dL (ref 8.9–10.3)
Chloride: 107 mmol/L (ref 98–111)
Creatinine: 0.94 mg/dL (ref 0.44–1.00)
GFR, Estimated: >60 mL/min (ref >60)
Glucose, Bld: 131 mg/dL — ABNORMAL HIGH (ref 70–99)
Potassium: 3.9 mmol/L (ref 3.5–5.1)
Sodium: 143 mmol/L (ref 135–145)
Total Bilirubin: 0.4 mg/dL (ref 0.3–1.2)
Total Protein: 6.8 g/dL (ref 6.5–8.1)

## 2022-10-30 LAB — CBC WITH DIFFERENTIAL (CANCER CENTER ONLY)
Abs Immature Granulocytes: 0.04 10*3/uL (ref 0.00–0.07)
Basophils Absolute: 0 10*3/uL (ref 0.0–0.1)
Basophils Relative: 0 %
Eosinophils Absolute: 0.1 10*3/uL (ref 0.0–0.5)
Eosinophils Relative: 2 %
HCT: 39.8 % (ref 36.0–46.0)
Hemoglobin: 13.7 g/dL (ref 12.0–15.0)
Immature Granulocytes: 1 %
Lymphocytes Relative: 31 %
Lymphs Abs: 1.8 10*3/uL (ref 0.7–4.0)
MCH: 32.4 pg (ref 26.0–34.0)
MCHC: 34.4 g/dL (ref 30.0–36.0)
MCV: 94.1 fL (ref 80.0–100.0)
Monocytes Absolute: 0.3 10*3/uL (ref 0.1–1.0)
Monocytes Relative: 5 %
Neutro Abs: 3.5 10*3/uL (ref 1.7–7.7)
Neutrophils Relative %: 61 %
Platelet Count: 186 10*3/uL (ref 150–400)
RBC: 4.23 MIL/uL (ref 3.87–5.11)
RDW: 13.2 % (ref 11.5–15.5)
WBC Count: 5.7 10*3/uL (ref 4.0–10.5)
nRBC: 0 % (ref 0.0–0.2)

## 2022-10-30 MED ORDER — LETROZOLE 2.5 MG PO TABS: 2.5000 mg | ORAL_TABLET | Freq: Every day | ORAL | 3 refills | Status: DC

## 2022-10-30 MED ORDER — METOPROLOL TARTRATE 50 MG PO TABS
50.0000 mg | ORAL_TABLET | Freq: Every day | ORAL | 5 refills | Status: DC
Start: 2022-10-30 — End: 2022-10-30
  Filled 2022-10-30: qty 30, 30d supply, fill #0

## 2022-10-30 MED ORDER — METFORMIN HCL 500 MG PO TABS
500.0000 mg | ORAL_TABLET | Freq: Two times a day (BID) | ORAL | 5 refills | Status: DC
Start: 2022-10-30 — End: 2022-10-30
  Filled 2022-10-30: qty 60, 30d supply, fill #0

## 2022-10-30 MED ORDER — LETROZOLE 2.5 MG PO TABS
2.5000 mg | ORAL_TABLET | Freq: Every day | ORAL | 3 refills | Status: DC
  Filled 2022-10-30: qty 90, 90d supply, fill #0

## 2022-10-30 MED ORDER — MONTELUKAST SODIUM 10 MG PO TABS
10.0000 mg | ORAL_TABLET | Freq: Every day | ORAL | 0 refills | Status: DC
Start: 2022-10-30 — End: 2022-10-30
  Filled 2022-10-30: qty 90, 90d supply, fill #0

## 2022-10-30 MED ORDER — METOPROLOL TARTRATE 50 MG PO TABS
50.0000 mg | ORAL_TABLET | Freq: Every day | ORAL | 5 refills | Status: DC
Start: 2022-10-30 — End: 2023-05-05

## 2022-10-30 MED ORDER — MONTELUKAST SODIUM 10 MG PO TABS
10.0000 mg | ORAL_TABLET | Freq: Every day | ORAL | 0 refills | Status: DC
Start: 2022-10-30 — End: 2023-01-27

## 2022-10-30 MED ORDER — GABAPENTIN 100 MG PO CAPS
100.0000 mg | ORAL_CAPSULE | Freq: Three times a day (TID) | ORAL | 3 refills | Status: DC
Start: 2022-10-30 — End: 2023-05-05

## 2022-10-30 MED ORDER — GABAPENTIN 100 MG PO CAPS
100.0000 mg | ORAL_CAPSULE | Freq: Three times a day (TID) | ORAL | 3 refills | Status: DC
Start: 2022-10-30 — End: 2022-10-30
  Filled 2022-10-30: qty 90, 30d supply, fill #0

## 2022-10-30 MED ORDER — PANTOPRAZOLE SODIUM 40 MG PO TBEC
40.0000 mg | DELAYED_RELEASE_TABLET | Freq: Every day | ORAL | 5 refills | Status: DC
Start: 2022-10-30 — End: 2022-10-30
  Filled 2022-10-30: qty 30, 30d supply, fill #0

## 2022-10-30 MED ORDER — METFORMIN HCL 500 MG PO TABS
500.0000 mg | ORAL_TABLET | Freq: Two times a day (BID) | ORAL | 5 refills | Status: DC
Start: 2022-10-30 — End: 2023-02-10

## 2022-10-30 MED ORDER — PANTOPRAZOLE SODIUM 40 MG PO TBEC
40.0000 mg | DELAYED_RELEASE_TABLET | Freq: Every day | ORAL | 5 refills | Status: DC
Start: 2022-10-30 — End: 2023-03-04

## 2022-10-30 NOTE — Progress Notes (Signed)
Per MD request, referral placed to [facility].  

## 2022-10-30 NOTE — Assessment & Plan Note (Signed)
07/04/2020:Screening mammogram detected right breast mass and right axillary lymph nodes.  Right breast nipple retraction and retroareolar thickening.  2.4 cm mass with 2 abnormal lymph nodes.  Biopsy revealed invasive lobular cancer grade 1-2 ER 95%, PR 95%, Ki-67 30%, HER2 equivocal by IHC negative by FISH ratio 1.39 T2N1 stage IIA   Right lumpectomy: 08/09/2020: ILC 3.8 cm, 19/19 LN Positive, Margins Neg   Plan: 1. Staging scans 2. Adjuvant chemo with TC X 6 (because of her propensity for diabetes she may only end up getting 4) 3. XRT 01/25/2021- 03/07/21 4. Adj Anti estrogen therapy (with Verzenio) started December 2022 ------------------------------------------------------------------------------------------------------------------------------------ 08/30/2020: CT CAP: No evidence of metastatic disease 08/30/2020: Bone scan: No evidence of bone metastases --------------------------------------------------------------------------------------------------------------------  Anastrozole toxicities: Intermittent hot flashes Abemaciclib Toxicities: Denies any side effects, current dosage: 100 mg twice a day (started December 2022)    Breast Cancer Surveillance 1. Breast Exam: 10/30/2022: Benign 2. CT chest 07/19/21: Radiation fibrosis, Resolution of Rt Axillary LN, Hep steatosis 3. Mammogram 07/12/21: Benign Density Cat B  Bone density 10/24/2021: T-score 0.4: Normal   Obesity: She is working with a healthy weight loss clinic.  Her insurance denied weight loss injections. Chemo-induced peripheral neuropathy: Currently on gabapentin 200 mg 3 times daily.   Return to clinic in 3 months with labs and for follow-up

## 2022-10-30 NOTE — Progress Notes (Signed)
Patient Care Team: Sonny Masters, FNP as PCP - General (Family Medicine) Serena Croissant, MD as Consulting Physician (Hematology and Oncology) Lonie Peak, MD as Attending Physician (Radiation Oncology) Harriette Bouillon, MD as Consulting Physician (General Surgery)  DIAGNOSIS:  Encounter Diagnoses  Name Primary?   Malignant neoplasm of central portion of right breast in female, estrogen receptor positive (HCC) Yes   Neuropathic pain    Prediabetes    Palpitations    Essential hypertension    Chronic allergic rhinitis    Gastroesophageal reflux disease without esophagitis     SUMMARY OF ONCOLOGIC HISTORY: Oncology History  Malignant neoplasm of central portion of right breast in female, estrogen receptor positive (HCC)  07/04/2020 Initial Diagnosis   Screening mammogram detected right breast mass and right axillary lymph nodes.  Right breast nipple retraction and retroareolar thickening.  2.4 cm mass with 2 abnormal lymph nodes.  Biopsy revealed invasive lobular cancer grade 1-2 ER 95%, PR 95%, Ki-67 30%, HER2 equivocal by IHC negative by FISH ratio 1.39   07/17/2020 Cancer Staging   Staging form: Breast, AJCC 8th Edition - Clinical stage from 07/17/2020: Stage IIA (cT2, cN1, cM0, G2, ER+, PR+, HER2-) - Signed by Serena Croissant, MD on 07/17/2020   08/09/2020 Surgery   Rght lumpectomy ([facility]): invasive lobular carcinoma, 3.8cm, clear margins, 19/19 right axillary lymph nodes positive for metastatic carcinoma.    08/09/2020 Cancer Staging   Staging form: Breast, AJCC 8th Edition - Pathologic stage from 08/09/2020: Stage IIIA (pT2, pN3a, cM0, G2, ER+, PR+, HER2-) - Signed by Loa Socks, NP on 08/23/2020 Stage prefix: Initial diagnosis Histologic grading system: 3 grade system   09/15/2020 - 11/24/2020 Chemotherapy   Adjuvant chemotherapy with TC Q21D x4       01/25/2021 - 03/07/2021 Radiation Therapy   Adjuvant radiation   03/2021 -  Anti-estrogen oral therapy   Letrozole  daily Added Verzinio on 06/27/21     CHIEF COMPLIANT: Follow-up on Verzinio and letrozole  INTERVAL HISTORY: Shelby [name] is a 52 year old above-mentioned history of breast cancer was currently on Verzinio on letrozole. She presents to the clinic for a follow-up. She reports that she is having tightness in her knee joints. She says it hurts. It gets worst when she moves around. She says when she sits it gets better. She has been riding a pedal bike to exercise her legs. The pain started Sunday. On a scale of 1-10 yesterday she says it was a 10. Today it is a 5. Neuropathy is still there. She is still tolerating the Verzenio. Bowels are normal.   ALLERGIES:  is allergic to grapefruit extract, peach flavor, and codeine.  MEDICATIONS:  Current Outpatient Medications  Medication Sig Dispense Refill   abemaciclib (VERZENIO) 100 MG tablet Take 1 tablet (100 mg total) by mouth 2 (two) times daily. Swallow tablets whole. Do not chew, crush, or split tablets before swallowing. 56 tablet 3   fluconazole (DIFLUCAN) 150 MG tablet Take by mouth.     levofloxacin (LEVAQUIN) 250 MG tablet Take 500 mg by mouth daily.     gabapentin (NEURONTIN) 100 MG capsule Take 1 capsule (100 mg total) by mouth 3 (three) times daily. 90 capsule 3   letrozole (FEMARA) 2.5 MG tablet Take 1 tablet (2.5 mg total) by mouth daily. 90 tablet 3   metFORMIN (GLUCOPHAGE) 500 MG tablet Take 1 tablet (500 mg total) by mouth 2 (two) times daily with a meal. 60 tablet 5   metoprolol tartrate (LOPRESSOR) 50  MG tablet Take 1 tablet (50 mg total) by mouth daily. 30 tablet 5   montelukast (SINGULAIR) 10 MG tablet Take 1 tablet (10 mg total) by mouth at bedtime. 90 tablet 0   pantoprazole (PROTONIX) 40 MG tablet Take 1 tablet (40 mg total) by mouth at bedtime. 30 tablet 5   No current facility-administered medications for this visit.    PHYSICAL EXAMINATION: ECOG PERFORMANCE STATUS: 1 - Symptomatic but completely ambulatory  Vitals:    10/30/22 0817  BP: (!) 153/81  Pulse: 77  Resp: 18  Temp: (!) 97.5 F (36.4 C)  SpO2: 99%   Filed Weights     LABORATORY DATA:  I have reviewed the data as listed    Latest Ref Rng & Units 10/30/2022    7:57 AM 08/06/2022   10:14 AM 05/03/2022    8:18 AM  CMP  Glucose 70 - 99 mg/dL 161  096  045   BUN 6 - 20 mg/dL 17  12  17    Creatinine 0.44 - 1.00 mg/dL 4.09  8.11  9.14   Sodium 135 - 145 mmol/L 143  141  140   Potassium 3.5 - 5.1 mmol/L 3.9  4.2  4.3   Chloride 98 - 111 mmol/L 107  105  105   CO2 22 - 32 mmol/L 28  23  27    Calcium 8.9 - 10.3 mg/dL 9.1  9.3  9.1   Total Protein 6.5 - 8.1 g/dL 6.8  7.0  7.0   Total Bilirubin 0.3 - 1.2 mg/dL 0.4  0.5  0.5   Alkaline Phos 38 - 126 U/L 95  88  92   AST 15 - 41 U/L 16  17  17    ALT 0 - 44 U/L 16  18  16      Lab Results  Component Value Date   WBC 5.7 10/30/2022   HGB 13.7 10/30/2022   HCT 39.8 10/30/2022   MCV 94.1 10/30/2022   PLT 186 10/30/2022   NEUTROABS 3.5 10/30/2022    ASSESSMENT & PLAN:  Malignant neoplasm of central portion of right breast in female, estrogen receptor positive (HCC) 07/04/2020:Screening mammogram detected right breast mass and right axillary lymph nodes.  Right breast nipple retraction and retroareolar thickening.  2.4 cm mass with 2 abnormal lymph nodes.  Biopsy revealed invasive lobular cancer grade 1-2 ER 95%, PR 95%, Ki-67 30%, HER2 equivocal by IHC negative by FISH ratio 1.39 T2N1 stage IIA   Right lumpectomy: 08/09/2020: ILC 3.8 cm, 19/19 LN Positive, Margins Neg   Plan: 1. Staging scans 2. Adjuvant chemo with TC X 6 (because of her propensity for diabetes she may only end up getting 4) 3. XRT 01/25/2021- 03/07/21 4. Adj Anti estrogen therapy (with Verzenio) started December 2022 ------------------------------------------------------------------------------------------------------------------------------------ 08/30/2020: CT CAP: No evidence of metastatic disease 08/30/2020: Bone scan: No  evidence of bone metastases --------------------------------------------------------------------------------------------------------------------  Anastrozole toxicities: Intermittent hot flashes Abemaciclib Toxicities: Denies any side effects, current dosage: 100 mg twice a day (started December 2022)    Breast Cancer Surveillance 1. Breast Exam: 10/30/2022: Benign 2. CT chest 07/19/21: Radiation fibrosis, Resolution of Rt Axillary LN, Hep steatosis 3. Mammogram 07/12/21: Benign Density Cat B  Bone density 10/24/2021: T-score 0.4: Normal   Obesity: We will send a referral to the healthy weight and wellness clinic. Chemo-induced peripheral neuropathy: Currently on gabapentin 200 mg 3 times daily.   Return to clinic in 3 months with labs and for follow-up   Orders Placed This Encounter  Procedures   Amb Ref to [facility]    Referral Priority:   Routine    Referral Type:   Consultation    Referral Reason:   Patient Preference    Number of Visits Requested:   1   The patient has a good understanding of the overall plan. she agrees with it. she will call with any problems that may develop before the next visit here. Total time spent: 30 mins including face to face time and time spent for planning, charting and co-ordination of care   Tamsen Meek, MD 10/30/22    I Janan Ridge am acting as a Neurosurgeon for [employer]  I have reviewed the above documentation for accuracy and completeness, and I agree with the above.

## 2022-10-30 NOTE — Telephone Encounter (Signed)
Notified Patient of prior authorization approval for Pantoprazole 40mg  tablets. Medication is approved through 10/30/2023. No other needs or concerns voiced at this time.

## 2022-10-30 NOTE — Progress Notes (Signed)
Error

## 2022-11-04 ENCOUNTER — Other Ambulatory Visit: Payer: Self-pay

## 2022-11-04 ENCOUNTER — Other Ambulatory Visit (HOSPITAL_COMMUNITY): Payer: Self-pay

## 2022-11-05 ENCOUNTER — Other Ambulatory Visit (HOSPITAL_COMMUNITY): Payer: Self-pay

## 2022-11-06 ENCOUNTER — Ambulatory Visit: Payer: BC Managed Care – PPO | Admitting: Family Medicine

## 2022-11-26 ENCOUNTER — Other Ambulatory Visit (HOSPITAL_COMMUNITY): Payer: Self-pay

## 2022-11-27 ENCOUNTER — Other Ambulatory Visit (HOSPITAL_COMMUNITY): Payer: Self-pay

## 2022-11-28 ENCOUNTER — Other Ambulatory Visit (HOSPITAL_COMMUNITY): Payer: Self-pay

## 2022-12-02 ENCOUNTER — Other Ambulatory Visit (HOSPITAL_COMMUNITY): Payer: Self-pay

## 2022-12-03 ENCOUNTER — Other Ambulatory Visit (HOSPITAL_COMMUNITY): Payer: Self-pay

## 2022-12-18 ENCOUNTER — Other Ambulatory Visit: Payer: Self-pay | Admitting: Hematology and Oncology

## 2022-12-18 ENCOUNTER — Other Ambulatory Visit (HOSPITAL_COMMUNITY): Payer: Self-pay

## 2022-12-19 ENCOUNTER — Other Ambulatory Visit: Payer: Self-pay

## 2022-12-19 MED ORDER — ABEMACICLIB 100 MG PO TABS
100.0000 mg | ORAL_TABLET | Freq: Two times a day (BID) | ORAL | 3 refills | Status: DC
  Filled 2022-12-19: qty 56, 28d supply, fill #0
  Filled 2023-01-16: qty 56, 28d supply, fill #1
  Filled 2023-02-11: qty 56, 28d supply, fill #2
  Filled 2023-03-12: qty 56, 28d supply, fill #3

## 2022-12-25 ENCOUNTER — Other Ambulatory Visit (HOSPITAL_COMMUNITY): Payer: Self-pay

## 2023-01-09 ENCOUNTER — Telehealth: Payer: BC Managed Care – PPO | Admitting: Nurse Practitioner

## 2023-01-09 ENCOUNTER — Encounter: Payer: Self-pay | Admitting: Nurse Practitioner

## 2023-01-09 DIAGNOSIS — J0100 Acute maxillary sinusitis, unspecified: Secondary | ICD-10-CM | POA: Diagnosis not present

## 2023-01-09 MED ORDER — FLUCONAZOLE 150 MG PO TABS: 150.0000 mg | ORAL_TABLET | Freq: Once | ORAL | 0 refills | Status: AC

## 2023-01-09 MED ORDER — AMOXICILLIN-POT CLAVULANATE 875-125 MG PO TABS: 1.0000 | ORAL_TABLET | Freq: Two times a day (BID) | ORAL | 0 refills | Status: DC

## 2023-01-09 NOTE — Patient Instructions (Signed)
Pasty Arch, thank you for joining Bennie Pierini, FNP for today's virtual visit.  While this provider is not your primary care provider (PCP), if your PCP is located in our provider database this encounter information will be shared with them immediately following your visit.   A [facility] MyChart account gives you access to today's visit and all your visits, tests, and labs performed at [facility] " click here if you don't have a [facility] MyChart account or go to [email or web address]  Consent: (Patient) Shelby [name] provided verbal consent for this virtual visit at the beginning of the encounter.  Current Medications:  Current Outpatient Medications:    amoxicillin-clavulanate (AUGMENTIN) 875-125 MG tablet, Take 1 tablet by mouth 2 (two) times daily., Disp: 14 tablet, Rfl: 0   fluconazole (DIFLUCAN) 150 MG tablet, Take 1 tablet (150 mg total) by mouth once for 1 dose., Disp: 1 tablet, Rfl: 0   abemaciclib (VERZENIO) 100 MG tablet, Take 1 tablet (100 mg total) by mouth 2 (two) times daily. Swallow tablets whole. Do not chew, crush, or split tablets before swallowing., Disp: 56 tablet, Rfl: 3   gabapentin (NEURONTIN) 100 MG capsule, Take 1 capsule (100 mg total) by mouth 3 (three) times daily., Disp: 90 capsule, Rfl: 3   letrozole (FEMARA) 2.5 MG tablet, Take 1 tablet (2.5 mg total) by mouth daily., Disp: 90 tablet, Rfl: 3   levofloxacin (LEVAQUIN) 250 MG tablet, Take 500 mg by mouth daily., Disp: , Rfl:    metFORMIN (GLUCOPHAGE) 500 MG tablet, Take 1 tablet (500 mg total) by mouth 2 (two) times daily with a meal., Disp: 60 tablet, Rfl: 5   metoprolol tartrate (LOPRESSOR) 50 MG tablet, Take 1 tablet (50 mg total) by mouth daily., Disp: 30 tablet, Rfl: 5   montelukast (SINGULAIR) 10 MG tablet, Take 1 tablet (10 mg total) by mouth at bedtime., Disp: 90 tablet, Rfl: 0   pantoprazole (PROTONIX) 40 MG tablet, Take 1 tablet (40 mg total) by mouth at bedtime., Disp: 30  tablet, Rfl: 5   Medications ordered in this encounter:  Meds ordered this encounter  Medications   amoxicillin-clavulanate (AUGMENTIN) 875-125 MG tablet    Sig: Take 1 tablet by mouth 2 (two) times daily.    Dispense:  14 tablet    Refill:  0    Order Specific Question:   Supervising Provider    Answer:   Arville Care A [[id number]]   fluconazole (DIFLUCAN) 150 MG tablet    Sig: Take 1 tablet (150 mg total) by mouth once for 1 dose.    Dispense:  1 tablet    Refill:  0    Order Specific Question:   Supervising Provider    Answer:   Arville Care A [[id number]]     *If you need refills on other medications prior to your next appointment, please contact your pharmacy*  Follow-Up: Call back or seek an in-person evaluation if the symptoms worsen or if the condition fails to improve as anticipated.  [facility] [phone]  Other Instructions 1. Take meds as prescribed 2. Use a cool mist humidifier especially during the winter months and when heat has been humid. 3. Use saline nose sprays frequently 4. Saline irrigations of the nose can be very helpful if done frequently.  * 4X daily for 1 week*  * Use of a nettie pot can be helpful with this. Follow directions with this* 5. Drink plenty of fluids 6. Keep thermostat turn down low 7.For  any cough or congestion- mucinex 8. For fever or aces or pains- take tylenol or ibuprofen appropriate for age and weight.  * for fevers greater than 101 orally you may alternate ibuprofen and tylenol every  3 hours.      If you have been instructed to have an in-person evaluation today at a local Urgent Care facility, please use the link below. It will take you to a list of all of our available [facility], including address, phone number and hours of operation. Please do not delay care.  [facility]  If you or a family member do not have a primary care provider, use the link below to schedule a visit  and establish care. When you choose a [facility] primary care physician or advanced practice provider, you gain a long-term partner in health. Find a Primary Care Provider  Learn more about [facility]'s in-office and virtual care options: [facility] - Get Care Now

## 2023-01-09 NOTE — Progress Notes (Signed)
Virtual Visit Consent   Shelby [name], you are scheduled for a virtual visit with Mary-Margaret Daphine Deutscher, FNP, a [facility] provider, today.     Just as with appointments in the office, your consent must be obtained to participate.  Your consent will be active for this visit and any virtual visit you may have with one of our providers in the next 365 days.     If you have a MyChart account, a copy of this consent can be sent to you electronically.  All virtual visits are billed to your insurance company just like a traditional visit in the office.    As this is a virtual visit, video technology does not allow for your provider to perform a traditional examination.  This may limit your provider's ability to fully assess your condition.  If your provider identifies any concerns that need to be evaluated in person or the need to arrange testing (such as labs, EKG, etc.), we will make arrangements to do so.     Although advances in technology are sophisticated, we cannot ensure that it will always work on either your end or our end.  If the connection with a video visit is poor, the visit may have to be switched to a telephone visit.  With either a video or telephone visit, we are not always able to ensure that we have a secure connection.     I need to obtain your verbal consent now.   Are you willing to proceed with your visit today? YES   Shelby [name] has provided verbal consent on 01/09/2023 for a virtual visit (video or telephone).   Mary-Margaret Daphine Deutscher, FNP   Date: 01/09/2023 1:13 PM   Virtual Visit via Video Note   I, Mary-Margaret Daphine Deutscher, connected with Shelby [name] ([id number], [birth year]) on 01/09/23 at  4:30 PM EDT by a video-enabled telemedicine application and verified that I am speaking with the correct person using two identifiers.  Location: Patient: Virtual Visit Location Patient: Home Provider: Virtual Visit Location Provider: Mobile   I discussed the limitations of  evaluation and management by telemedicine and the availability of in person appointments. The patient expressed understanding and agreed to proceed.    History of Present Illness: Shelby [name] is a 52 y.o. who identifies as a female who was assigned female at birth, and is being seen today for covid positive.  HPI: URI  This is a new problem. The current episode started yesterday. The problem has been waxing and waning. There has been no fever. Associated symptoms include congestion, coughing, headaches and rhinorrhea. Pertinent negatives include no sneezing. She has tried acetaminophen for the symptoms. The treatment provided no relief.  Tested negative for covid last night but turned positive 4-5 hours later. She repeated this mornong and was negative nad has not turned yet.  Review of Systems  HENT:  Positive for congestion and rhinorrhea. Negative for sneezing.   Respiratory:  Positive for cough.   Neurological:  Positive for headaches.    Problems:  Patient Active Problem List   Diagnosis Date Noted   Neuropathic pain 04/02/2022   Malignant neoplasm of central portion of right breast in female, estrogen receptor positive (HCC) 07/17/2020   Annual physical exam 07/11/2020   Wound check, abscess 07/11/2020   Allergic rhinitis 08/18/2017   Anxiety 08/18/2017   Nonhealing nonsurgical wound 05/13/2016   Gastroesophageal reflux disease without esophagitis 04/22/2016   Eustachian tube dysfunction, bilateral 04/22/2016   Moderate single  current episode of major depressive disorder (HCC) 04/22/2016   Essential hypertension 03/19/2016   Hypertriglyceridemia 03/19/2016   Morbid obesity (HCC) 03/19/2016   Palpitations 03/19/2016    Allergies:  Allergies  Allergen Reactions   Grapefruit Extract     Can't have anything with grapefruit due to her cancer treatments   Peach Flavor Other (See Comments)    Severe Migraines   Codeine Palpitations   Medications:  Current Outpatient  Medications:    abemaciclib (VERZENIO) 100 MG tablet, Take 1 tablet (100 mg total) by mouth 2 (two) times daily. Swallow tablets whole. Do not chew, crush, or split tablets before swallowing., Disp: 56 tablet, Rfl: 3   fluconazole (DIFLUCAN) 150 MG tablet, Take by mouth., Disp: , Rfl:    gabapentin (NEURONTIN) 100 MG capsule, Take 1 capsule (100 mg total) by mouth 3 (three) times daily., Disp: 90 capsule, Rfl: 3   letrozole (FEMARA) 2.5 MG tablet, Take 1 tablet (2.5 mg total) by mouth daily., Disp: 90 tablet, Rfl: 3   levofloxacin (LEVAQUIN) 250 MG tablet, Take 500 mg by mouth daily., Disp: , Rfl:    metFORMIN (GLUCOPHAGE) 500 MG tablet, Take 1 tablet (500 mg total) by mouth 2 (two) times daily with a meal., Disp: 60 tablet, Rfl: 5   metoprolol tartrate (LOPRESSOR) 50 MG tablet, Take 1 tablet (50 mg total) by mouth daily., Disp: 30 tablet, Rfl: 5   montelukast (SINGULAIR) 10 MG tablet, Take 1 tablet (10 mg total) by mouth at bedtime., Disp: 90 tablet, Rfl: 0   pantoprazole (PROTONIX) 40 MG tablet, Take 1 tablet (40 mg total) by mouth at bedtime., Disp: 30 tablet, Rfl: 5  Observations/Objective: Patient is well-developed, well-nourished in no acute distress.  Resting comfortably  at home.  Head is normocephalic, atraumatic.  No labored breathing.  Speech is clear and coherent with logical content.  Patient is alert and oriented at baseline.  Raspy voice No cough Maxillary sinus tenderness bil.  Assessment and Plan:  Shelby [name] in today with chief complaint of Covid Positive   1. Acute maxillary sinusitis, recurrence not specified 1. Take meds as prescribed 2. Use a cool mist humidifier especially during the winter months and when heat has been humid. 3. Use saline nose sprays frequently 4. Saline irrigations of the nose can be very helpful if done frequently.  * 4X daily for 1 week*  * Use of a nettie pot can be helpful with this. Follow directions with this* 5. Drink plenty of  fluids 6. Keep thermostat turn down low 7.For any cough or congestion- mucinex OTC 8. For fever or aces or pains- take tylenol or ibuprofen appropriate for age and weight.  * for fevers greater than 101 orally you may alternate ibuprofen and tylenol every  3 hours.    Meds ordered this encounter  Medications   amoxicillin-clavulanate (AUGMENTIN) 875-125 MG tablet    Sig: Take 1 tablet by mouth 2 (two) times daily.    Dispense:  14 tablet    Refill:  0    Order Specific Question:   Supervising Provider    Answer:   Arville Care A [[id number]]     Follow Up Instructions: I discussed the assessment and treatment plan with the patient. The patient was provided an opportunity to ask questions and all were answered. The patient agreed with the plan and demonstrated an understanding of the instructions.  A copy of instructions were sent to the patient via MyChart.  The patient was advised to  call back or seek an in-person evaluation if the symptoms worsen or if the condition fails to improve as anticipated.  Time:  I spent 10 minutes with the patient via telehealth technology discussing the above problems/concerns.    Mary-Margaret Daphine Deutscher, FNP

## 2023-01-16 ENCOUNTER — Other Ambulatory Visit (HOSPITAL_COMMUNITY): Payer: Self-pay

## 2023-01-20 ENCOUNTER — Other Ambulatory Visit (HOSPITAL_COMMUNITY): Payer: Self-pay

## 2023-01-21 ENCOUNTER — Other Ambulatory Visit (HOSPITAL_COMMUNITY): Payer: Self-pay

## 2023-01-25 ENCOUNTER — Other Ambulatory Visit: Payer: Self-pay | Admitting: Family Medicine

## 2023-01-25 DIAGNOSIS — J309 Allergic rhinitis, unspecified: Secondary | ICD-10-CM

## 2023-01-26 NOTE — Progress Notes (Signed)
Patient Care Team: Sonny Masters, FNP as PCP - General (Family Medicine) Serena Croissant, MD as Consulting Physician (Hematology and Oncology) Lonie Peak, MD as Attending Physician (Radiation Oncology) Harriette Bouillon, MD as Consulting Physician (General Surgery)  DIAGNOSIS: No diagnosis found.  SUMMARY OF ONCOLOGIC HISTORY: Oncology History  Malignant neoplasm of central portion of right breast in female, estrogen receptor positive (HCC)  07/04/2020 Initial Diagnosis   Screening mammogram detected right breast mass and right axillary lymph nodes.  Right breast nipple retraction and retroareolar thickening.  2.4 cm mass with 2 abnormal lymph nodes.  Biopsy revealed invasive lobular cancer grade 1-2 ER 95%, PR 95%, Ki-67 30%, HER2 equivocal by IHC negative by FISH ratio 1.39   07/17/2020 Cancer Staging   Staging form: Breast, AJCC 8th Edition - Clinical stage from 07/17/2020: Stage IIA (cT2, cN1, cM0, G2, ER+, PR+, HER2-) - Signed by Serena Croissant, MD on 07/17/2020   08/09/2020 Surgery   Rght lumpectomy ([facility]): invasive lobular carcinoma, 3.8cm, clear margins, 19/19 right axillary lymph nodes positive for metastatic carcinoma.    08/09/2020 Cancer Staging   Staging form: Breast, AJCC 8th Edition - Pathologic stage from 08/09/2020: Stage IIIA (pT2, pN3a, cM0, G2, ER+, PR+, HER2-) - Signed by Loa Socks, NP on 08/23/2020 Stage prefix: Initial diagnosis Histologic grading system: 3 grade system   09/15/2020 - 11/24/2020 Chemotherapy   Adjuvant chemotherapy with TC Q21D x4       01/25/2021 - 03/07/2021 Radiation Therapy   Adjuvant radiation   03/2021 -  Anti-estrogen oral therapy   Letrozole daily Added Verzinio on 06/27/21     CHIEF COMPLIANT: Follow-up on Verzinio and letrozole   INTERVAL HISTORY: Shelby [name] is a 52 year old above-mentioned history of breast cancer was currently on Verzinio on letrozole. She presents to the clinic for a follow-up.      ALLERGIES:   is allergic to grapefruit extract, peach flavor, and codeine.  MEDICATIONS:  Current Outpatient Medications  Medication Sig Dispense Refill   abemaciclib (VERZENIO) 100 MG tablet Take 1 tablet (100 mg total) by mouth 2 (two) times daily. Swallow tablets whole. Do not chew, crush, or split tablets before swallowing. 56 tablet 3   amoxicillin-clavulanate (AUGMENTIN) 875-125 MG tablet Take 1 tablet by mouth 2 (two) times daily. 14 tablet 0   gabapentin (NEURONTIN) 100 MG capsule Take 1 capsule (100 mg total) by mouth 3 (three) times daily. 90 capsule 3   letrozole (FEMARA) 2.5 MG tablet Take 1 tablet (2.5 mg total) by mouth daily. 90 tablet 3   levofloxacin (LEVAQUIN) 250 MG tablet Take 500 mg by mouth daily.     metFORMIN (GLUCOPHAGE) 500 MG tablet Take 1 tablet (500 mg total) by mouth 2 (two) times daily with a meal. 60 tablet 5   metoprolol tartrate (LOPRESSOR) 50 MG tablet Take 1 tablet (50 mg total) by mouth daily. 30 tablet 5   montelukast (SINGULAIR) 10 MG tablet Take 1 tablet (10 mg total) by mouth at bedtime. 90 tablet 0   pantoprazole (PROTONIX) 40 MG tablet Take 1 tablet (40 mg total) by mouth at bedtime. 30 tablet 5   No current facility-administered medications for this visit.    PHYSICAL EXAMINATION: ECOG PERFORMANCE STATUS: {CHL ONC ECOG [phone]}  There were no vitals filed for this visit. There were no vitals filed for this visit.  BREAST:*** No palpable masses or nodules in either right or left breasts. No palpable axillary supraclavicular or infraclavicular adenopathy no breast tenderness or nipple discharge. (  exam performed in the presence of a chaperone)  LABORATORY DATA:  I have reviewed the data as listed    Latest Ref Rng & Units 10/30/2022    7:57 AM 08/06/2022   10:14 AM 05/03/2022    8:18 AM  CMP  Glucose 70 - 99 mg/dL 621  308  657   BUN 6 - 20 mg/dL 17  12  17    Creatinine 0.44 - 1.00 mg/dL 8.46  9.62  9.52   Sodium 135 - 145 mmol/L 143  141  140    Potassium 3.5 - 5.1 mmol/L 3.9  4.2  4.3   Chloride 98 - 111 mmol/L 107  105  105   CO2 22 - 32 mmol/L 28  23  27    Calcium 8.9 - 10.3 mg/dL 9.1  9.3  9.1   Total Protein 6.5 - 8.1 g/dL 6.8  7.0  7.0   Total Bilirubin 0.3 - 1.2 mg/dL 0.4  0.5  0.5   Alkaline Phos 38 - 126 U/L 95  88  92   AST 15 - 41 U/L 16  17  17    ALT 0 - 44 U/L 16  18  16      Lab Results  Component Value Date   WBC 5.7 10/30/2022   HGB 13.7 10/30/2022   HCT 39.8 10/30/2022   MCV 94.1 10/30/2022   PLT 186 10/30/2022   NEUTROABS 3.5 10/30/2022    ASSESSMENT & PLAN:  No problem-specific Assessment & Plan notes found for this encounter.    No orders of the defined types were placed in this encounter.  The patient has a good understanding of the overall plan. she agrees with it. she will call with any problems that may develop before the next visit here. Total time spent: 30 mins including face to face time and time spent for planning, charting and co-ordination of care   Sherlyn Lick, CMA 01/26/23    I Janan Ridge am acting as a Neurosurgeon for [employer]  ***

## 2023-01-27 ENCOUNTER — Encounter (INDEPENDENT_AMBULATORY_CARE_PROVIDER_SITE_OTHER): Payer: BC Managed Care – PPO | Admitting: Physician Assistant

## 2023-01-29 ENCOUNTER — Other Ambulatory Visit: Payer: Self-pay

## 2023-01-29 DIAGNOSIS — Z170 Estrogen receptor positive status [ER+]: Secondary | ICD-10-CM

## 2023-01-30 ENCOUNTER — Inpatient Hospital Stay: Payer: BC Managed Care – PPO | Attending: Hematology and Oncology

## 2023-01-30 ENCOUNTER — Other Ambulatory Visit: Payer: Self-pay

## 2023-01-30 ENCOUNTER — Inpatient Hospital Stay (HOSPITAL_BASED_OUTPATIENT_CLINIC_OR_DEPARTMENT_OTHER): Payer: BC Managed Care – PPO | Admitting: Hematology and Oncology

## 2023-01-30 VITALS — BP 138/81 | HR 78 | Temp 97.7°F | Resp 18 | Ht 65.0 in | Wt >= 6400 oz

## 2023-01-30 DIAGNOSIS — Z170 Estrogen receptor positive status [ER+]: Secondary | ICD-10-CM

## 2023-01-30 DIAGNOSIS — Z9221 Personal history of antineoplastic chemotherapy: Secondary | ICD-10-CM | POA: Insufficient documentation

## 2023-01-30 DIAGNOSIS — Z79811 Long term (current) use of aromatase inhibitors: Secondary | ICD-10-CM | POA: Diagnosis not present

## 2023-01-30 DIAGNOSIS — R232 Flushing: Secondary | ICD-10-CM | POA: Insufficient documentation

## 2023-01-30 DIAGNOSIS — C50111 Malignant neoplasm of central portion of right female breast: Secondary | ICD-10-CM | POA: Insufficient documentation

## 2023-01-30 DIAGNOSIS — E669 Obesity, unspecified: Secondary | ICD-10-CM | POA: Diagnosis not present

## 2023-01-30 DIAGNOSIS — Z923 Personal history of irradiation: Secondary | ICD-10-CM | POA: Diagnosis not present

## 2023-01-30 DIAGNOSIS — C773 Secondary and unspecified malignant neoplasm of axilla and upper limb lymph nodes: Secondary | ICD-10-CM | POA: Insufficient documentation

## 2023-01-30 DIAGNOSIS — G620 Drug-induced polyneuropathy: Secondary | ICD-10-CM | POA: Diagnosis not present

## 2023-01-30 LAB — CBC WITH DIFFERENTIAL (CANCER CENTER ONLY)
Abs Immature Granulocytes: 0.03 10*3/uL (ref 0.00–0.07)
Basophils Absolute: 0 10*3/uL (ref 0.0–0.1)
Basophils Relative: 1 %
Eosinophils Absolute: 0.1 10*3/uL (ref 0.0–0.5)
Eosinophils Relative: 1 %
HCT: 40.9 % (ref 36.0–46.0)
Hemoglobin: 14.4 g/dL (ref 12.0–15.0)
Immature Granulocytes: 1 %
Lymphocytes Relative: 27 %
Lymphs Abs: 1.5 10*3/uL (ref 0.7–4.0)
MCH: 32.4 pg (ref 26.0–34.0)
MCHC: 35.2 g/dL (ref 30.0–36.0)
MCV: 92.1 fL (ref 80.0–100.0)
Monocytes Absolute: 0.3 10*3/uL (ref 0.1–1.0)
Monocytes Relative: 6 %
Neutro Abs: 3.7 10*3/uL (ref 1.7–7.7)
Neutrophils Relative %: 64 %
Platelet Count: 197 10*3/uL (ref 150–400)
RBC: 4.44 MIL/uL (ref 3.87–5.11)
RDW: 12.9 % (ref 11.5–15.5)
WBC Count: 5.7 10*3/uL (ref 4.0–10.5)
nRBC: 0 % (ref 0.0–0.2)

## 2023-01-30 LAB — CMP (CANCER CENTER ONLY)
ALT: 19 U/L (ref 0–44)
AST: 16 U/L (ref 15–41)
Albumin: 3.8 g/dL (ref 3.5–5.0)
Alkaline Phosphatase: 95 U/L (ref 38–126)
Anion gap: 10 (ref 5–15)
BUN: 13 mg/dL (ref 6–20)
CO2: 25 mmol/L (ref 22–32)
Calcium: 8.9 mg/dL (ref 8.9–10.3)
Chloride: 106 mmol/L (ref 98–111)
Creatinine: 0.89 mg/dL (ref 0.44–1.00)
GFR, Estimated: >60 mL/min (ref >60)
Glucose, Bld: 114 mg/dL — ABNORMAL HIGH (ref 70–99)
Potassium: 4.2 mmol/L (ref 3.5–5.1)
Sodium: 141 mmol/L (ref 135–145)
Total Bilirubin: 0.4 mg/dL (ref 0.3–1.2)
Total Protein: 6.8 g/dL (ref 6.5–8.1)

## 2023-01-30 NOTE — Assessment & Plan Note (Addendum)
07/04/2020:Screening mammogram detected right breast mass and right axillary lymph nodes.  Right breast nipple retraction and retroareolar thickening.  2.4 cm mass with 2 abnormal lymph nodes.  Biopsy revealed invasive lobular cancer grade 1-2 ER 95%, PR 95%, Ki-67 30%, HER2 equivocal by IHC negative by FISH ratio 1.39 T2N1 stage IIA   Right lumpectomy: 08/09/2020: ILC 3.8 cm, 19/19 LN Positive, Margins Neg   Plan: 1. Staging scans 2. Adjuvant chemo with TC X 6 (because of her propensity for diabetes she may only end up getting 4) 3. XRT 01/25/2021- 03/07/21 4. Adj Anti estrogen therapy (with Verzenio) started December 2022 ------------------------------------------------------------------------------------------------------------------------------------ 08/30/2020: CT CAP: No evidence of metastatic disease 08/30/2020: Bone scan: No evidence of bone metastases --------------------------------------------------------------------------------------------------------------------  Anastrozole toxicities: Intermittent hot flashes Abemaciclib Toxicities: Denies any side effects, current dosage: 100 mg twice a day (started December 2022)    Breast Cancer Surveillance 1. Breast Exam: 10/30/2022: Benign 2. CT chest 07/19/21: Radiation fibrosis, Resolution of Rt Axillary LN, Hep steatosis 3. Mammogram 08/28/2022: Benign Density Cat B   Bone density 10/24/2021: T-score 0.4: Normal   Obesity: We will send a referral to the healthy weight and wellness clinic. Chemo-induced peripheral neuropathy: Currently on gabapentin 200 mg 3 times daily.   Return to clinic in Dec with labs and for follow-up (at that time she will complete Verzinio)

## 2023-02-08 ENCOUNTER — Other Ambulatory Visit: Payer: Self-pay | Admitting: Hematology and Oncology

## 2023-02-08 DIAGNOSIS — R7303 Prediabetes: Secondary | ICD-10-CM

## 2023-02-11 ENCOUNTER — Other Ambulatory Visit (HOSPITAL_COMMUNITY): Payer: Self-pay

## 2023-02-21 ENCOUNTER — Other Ambulatory Visit (HOSPITAL_COMMUNITY): Payer: Self-pay

## 2023-03-02 ENCOUNTER — Other Ambulatory Visit: Payer: Self-pay | Admitting: Hematology and Oncology

## 2023-03-02 DIAGNOSIS — K219 Gastro-esophageal reflux disease without esophagitis: Secondary | ICD-10-CM

## 2023-03-12 ENCOUNTER — Other Ambulatory Visit (HOSPITAL_COMMUNITY): Payer: Self-pay

## 2023-03-13 ENCOUNTER — Other Ambulatory Visit: Payer: Self-pay

## 2023-03-18 ENCOUNTER — Other Ambulatory Visit: Payer: Self-pay

## 2023-03-24 ENCOUNTER — Other Ambulatory Visit: Payer: Self-pay

## 2023-04-11 ENCOUNTER — Other Ambulatory Visit: Payer: Self-pay | Admitting: Hematology and Oncology

## 2023-04-11 ENCOUNTER — Other Ambulatory Visit: Payer: Self-pay

## 2023-04-11 MED ORDER — ABEMACICLIB 100 MG PO TABS
100.0000 mg | ORAL_TABLET | Freq: Two times a day (BID) | ORAL | 3 refills | Status: DC
  Filled 2023-04-11: qty 56, 28d supply, fill #0
  Filled 2023-05-06: qty 56, 28d supply, fill #1

## 2023-04-11 NOTE — Telephone Encounter (Signed)
Can you please help fill this while [facility] is out. Thanks!

## 2023-04-11 NOTE — Progress Notes (Signed)
Specialty Pharmacy Refill Coordination Note  Shelby [name] is a 53 y.o. female contacted today regarding refills of specialty medication(s) Abemaciclib   Patient requested Delivery   Delivery date: 04/15/23   Verified address: [address] [place], [address]   Medication will be filled on 04/14/23.

## 2023-04-14 ENCOUNTER — Other Ambulatory Visit: Payer: Self-pay

## 2023-04-14 NOTE — Progress Notes (Signed)
Insurance will not pay until 10/15. Notified patient and she is OK with delay.

## 2023-04-16 ENCOUNTER — Other Ambulatory Visit: Payer: Self-pay

## 2023-05-04 ENCOUNTER — Other Ambulatory Visit: Payer: Self-pay | Admitting: Hematology and Oncology

## 2023-05-04 ENCOUNTER — Other Ambulatory Visit: Payer: Self-pay | Admitting: Family Medicine

## 2023-05-04 DIAGNOSIS — M792 Neuralgia and neuritis, unspecified: Secondary | ICD-10-CM

## 2023-05-04 DIAGNOSIS — J309 Allergic rhinitis, unspecified: Secondary | ICD-10-CM

## 2023-05-04 DIAGNOSIS — I10 Essential (primary) hypertension: Secondary | ICD-10-CM

## 2023-05-04 DIAGNOSIS — R002 Palpitations: Secondary | ICD-10-CM

## 2023-05-06 ENCOUNTER — Other Ambulatory Visit: Payer: Self-pay

## 2023-05-06 NOTE — Progress Notes (Signed)
Specialty Pharmacy Refill Coordination Note  Shelby [name] is a 52 y.o. female contacted today regarding refills of specialty medication(s) Abemaciclib   Patient requested Delivery   Delivery date: 05/13/23   Verified address: [address] [place], [address]   Medication will be filled on 05/12/23.

## 2023-05-06 NOTE — Progress Notes (Signed)
Specialty Pharmacy Ongoing Clinical Assessment Note  Shelby [name] is a 52 y.o. female who is being followed by the specialty pharmacy service for RxSp Oncology   Patient's specialty medication(s) reviewed today: Abemaciclib   Missed doses in the last 4 weeks: 0   Patient/Caregiver did not have any additional questions or concerns.   Therapeutic benefit summary: Patient is achieving benefit   Adverse events/side effects summary: No adverse events/side effects   Patient's therapy is appropriate to: Continue    Goals Addressed             This Visit's Progress    Slow Disease Progression       Patient is on track. Patient will maintain adherence. Per provider notes patient to follow up in December and complete treatment with Verzenio.          Follow up:  6 months  Otto Herb Specialty Pharmacist

## 2023-05-08 DIAGNOSIS — J019 Acute sinusitis, unspecified: Secondary | ICD-10-CM | POA: Diagnosis not present

## 2023-05-08 DIAGNOSIS — N390 Urinary tract infection, site not specified: Secondary | ICD-10-CM | POA: Diagnosis not present

## 2023-05-12 ENCOUNTER — Other Ambulatory Visit: Payer: Self-pay

## 2023-05-12 ENCOUNTER — Other Ambulatory Visit (HOSPITAL_COMMUNITY): Payer: Self-pay

## 2023-05-12 NOTE — Progress Notes (Signed)
Insurance will not pay until 11/12. Spoke with patient, and she is OK with delay.

## 2023-05-12 NOTE — Progress Notes (Signed)
Opened in error

## 2023-05-13 ENCOUNTER — Other Ambulatory Visit: Payer: Self-pay

## 2023-05-30 ENCOUNTER — Other Ambulatory Visit (HOSPITAL_COMMUNITY): Payer: Self-pay

## 2023-06-02 ENCOUNTER — Encounter (HOSPITAL_COMMUNITY): Payer: Self-pay

## 2023-06-02 ENCOUNTER — Other Ambulatory Visit (HOSPITAL_COMMUNITY): Payer: Self-pay

## 2023-06-04 ENCOUNTER — Other Ambulatory Visit (HOSPITAL_COMMUNITY): Payer: Self-pay

## 2023-06-09 ENCOUNTER — Other Ambulatory Visit: Payer: Self-pay | Admitting: Pharmacy Technician

## 2023-06-09 ENCOUNTER — Inpatient Hospital Stay: Payer: BC Managed Care – PPO | Attending: Hematology and Oncology

## 2023-06-09 ENCOUNTER — Inpatient Hospital Stay (HOSPITAL_BASED_OUTPATIENT_CLINIC_OR_DEPARTMENT_OTHER): Payer: BC Managed Care – PPO | Admitting: Hematology and Oncology

## 2023-06-09 VITALS — BP 177/91 | HR 101 | Temp 98.5°F | Resp 18 | Ht 65.0 in

## 2023-06-09 DIAGNOSIS — K219 Gastro-esophageal reflux disease without esophagitis: Secondary | ICD-10-CM

## 2023-06-09 DIAGNOSIS — Z803 Family history of malignant neoplasm of breast: Secondary | ICD-10-CM | POA: Insufficient documentation

## 2023-06-09 DIAGNOSIS — M792 Neuralgia and neuritis, unspecified: Secondary | ICD-10-CM | POA: Diagnosis not present

## 2023-06-09 DIAGNOSIS — I10 Essential (primary) hypertension: Secondary | ICD-10-CM | POA: Insufficient documentation

## 2023-06-09 DIAGNOSIS — C50111 Malignant neoplasm of central portion of right female breast: Secondary | ICD-10-CM

## 2023-06-09 DIAGNOSIS — J309 Allergic rhinitis, unspecified: Secondary | ICD-10-CM | POA: Insufficient documentation

## 2023-06-09 DIAGNOSIS — R002 Palpitations: Secondary | ICD-10-CM | POA: Insufficient documentation

## 2023-06-09 DIAGNOSIS — R7303 Prediabetes: Secondary | ICD-10-CM | POA: Diagnosis not present

## 2023-06-09 DIAGNOSIS — Z1721 Progesterone receptor positive status: Secondary | ICD-10-CM | POA: Insufficient documentation

## 2023-06-09 DIAGNOSIS — G620 Drug-induced polyneuropathy: Secondary | ICD-10-CM | POA: Insufficient documentation

## 2023-06-09 DIAGNOSIS — Z9221 Personal history of antineoplastic chemotherapy: Secondary | ICD-10-CM | POA: Insufficient documentation

## 2023-06-09 DIAGNOSIS — Z923 Personal history of irradiation: Secondary | ICD-10-CM | POA: Insufficient documentation

## 2023-06-09 DIAGNOSIS — Z170 Estrogen receptor positive status [ER+]: Secondary | ICD-10-CM | POA: Insufficient documentation

## 2023-06-09 DIAGNOSIS — Z79811 Long term (current) use of aromatase inhibitors: Secondary | ICD-10-CM | POA: Diagnosis not present

## 2023-06-09 DIAGNOSIS — E669 Obesity, unspecified: Secondary | ICD-10-CM | POA: Diagnosis not present

## 2023-06-09 LAB — CMP (CANCER CENTER ONLY)
ALT: 14 U/L (ref 0–44)
AST: 15 U/L (ref 15–41)
Albumin: 3.7 g/dL (ref 3.5–5.0)
Alkaline Phosphatase: 79 U/L (ref 38–126)
Anion gap: 10 (ref 5–15)
BUN: 17 mg/dL (ref 6–20)
CO2: 26 mmol/L (ref 22–32)
Calcium: 9.3 mg/dL (ref 8.9–10.3)
Chloride: 105 mmol/L (ref 98–111)
Creatinine: 0.87 mg/dL (ref 0.44–1.00)
GFR, Estimated: >60 mL/min (ref >60)
Glucose, Bld: 126 mg/dL — ABNORMAL HIGH (ref 70–99)
Potassium: 4.2 mmol/L (ref 3.5–5.1)
Sodium: 141 mmol/L (ref 135–145)
Total Bilirubin: 0.5 mg/dL (ref <1.2)
Total Protein: 7.1 g/dL (ref 6.5–8.1)

## 2023-06-09 LAB — CBC WITH DIFFERENTIAL (CANCER CENTER ONLY)
Abs Immature Granulocytes: 0.04 10*3/uL (ref 0.00–0.07)
Basophils Absolute: 0 10*3/uL (ref 0.0–0.1)
Basophils Relative: 0 %
Eosinophils Absolute: 0.1 10*3/uL (ref 0.0–0.5)
Eosinophils Relative: 1 %
HCT: 40.1 % (ref 36.0–46.0)
Hemoglobin: 13.8 g/dL (ref 12.0–15.0)
Immature Granulocytes: 1 %
Lymphocytes Relative: 26 %
Lymphs Abs: 1.6 10*3/uL (ref 0.7–4.0)
MCH: 32.5 pg (ref 26.0–34.0)
MCHC: 34.4 g/dL (ref 30.0–36.0)
MCV: 94.6 fL (ref 80.0–100.0)
Monocytes Absolute: 0.4 10*3/uL (ref 0.1–1.0)
Monocytes Relative: 7 %
Neutro Abs: 4 10*3/uL (ref 1.7–7.7)
Neutrophils Relative %: 65 %
Platelet Count: 192 10*3/uL (ref 150–400)
RBC: 4.24 MIL/uL (ref 3.87–5.11)
RDW: 13.1 % (ref 11.5–15.5)
WBC Count: 6.1 10*3/uL (ref 4.0–10.5)
nRBC: 0 % (ref 0.0–0.2)

## 2023-06-09 MED ORDER — METOPROLOL TARTRATE 50 MG PO TABS: 50.0000 mg | ORAL_TABLET | Freq: Every day | ORAL | 3 refills | Status: DC

## 2023-06-09 MED ORDER — GABAPENTIN 100 MG PO CAPS: 200.0000 mg | ORAL_CAPSULE | Freq: Every day | ORAL | 3 refills | Status: DC

## 2023-06-09 MED ORDER — METFORMIN HCL 500 MG PO TABS: 500.0000 mg | ORAL_TABLET | Freq: Two times a day (BID) | ORAL | 1 refills | Status: DC

## 2023-06-09 MED ORDER — MONTELUKAST SODIUM 10 MG PO TABS: 10.0000 mg | ORAL_TABLET | Freq: Every day | ORAL | 3 refills | Status: DC

## 2023-06-09 MED ORDER — PANTOPRAZOLE SODIUM 40 MG PO TBEC: 40.0000 mg | DELAYED_RELEASE_TABLET | Freq: Every day | ORAL | 3 refills | Status: DC

## 2023-06-09 NOTE — Assessment & Plan Note (Addendum)
07/04/2020:Screening mammogram detected right breast mass and right axillary lymph nodes.  Right breast nipple retraction and retroareolar thickening.  2.4 cm mass with 2 abnormal lymph nodes.  Biopsy revealed invasive lobular cancer grade 1-2 ER 95%, PR 95%, Ki-67 30%, HER2 equivocal by IHC negative by FISH ratio 1.39 T2N1 stage IIA   Right lumpectomy: 08/09/2020: ILC 3.8 cm, 19/19 LN Positive, Margins Neg   Plan: 1. Staging scans 2. Adjuvant chemo with TC X 6 (because of her propensity for diabetes she may only end up getting 4) 3. XRT 01/25/2021- 03/07/21 4. Adj Anti estrogen therapy (with Verzenio) started December 2022 (Verzinio completed December 2024) ------------------------------------------------------------------------------------------------------------------------------------ 08/30/2020: CT CAP: No evidence of metastatic disease 08/30/2020: Bone scan: No evidence of bone metastases --------------------------------------------------------------------------------------------------------------------  Anastrozole toxicities: Intermittent hot flashes Abemaciclib Toxicities: Denies any side effects, current dosage: 100 mg twice a day (started December 2022) With this she completed the 2 years of Verzinio therapy.  Therefore she will discontinue it at this time.  She will remain on antiestrogen therapy with anastrozole.  Breast Cancer Surveillance 1. Breast Exam: 06/09/2023: Benign 2. CT chest 07/19/21: Radiation fibrosis, Resolution of Rt Axillary LN, Hep steatosis 3. Mammogram 08/28/2022: Benign Density Cat B   Bone density 10/24/2021: T-score 0.4: Normal   Obesity: She could not see them and will make another appointment. Chemo-induced peripheral neuropathy: Currently on gabapentin 200 mg daily. I renewed all of her medications and recommended that she see a primary care physician.  She will try to get a primary care doctor by next year.    Return to clinic in 1 year for follow-up

## 2023-06-09 NOTE — Progress Notes (Signed)
Patient Care Team: Sonny Masters, FNP as PCP - General (Family Medicine) Serena Croissant, MD as Consulting Physician (Hematology and Oncology) Lonie Peak, MD as Attending Physician (Radiation Oncology) Harriette Bouillon, MD as Consulting Physician (General Surgery)  DIAGNOSIS:  Encounter Diagnoses  Name Primary?   Malignant neoplasm of central portion of right breast in female, estrogen receptor positive (HCC) Yes   Neuropathic pain    Prediabetes    Palpitations    Essential hypertension    Gastroesophageal reflux disease without esophagitis    Chronic allergic rhinitis     SUMMARY OF ONCOLOGIC HISTORY: Oncology History  Malignant neoplasm of central portion of right breast in female, estrogen receptor positive (HCC)  07/04/2020 Initial Diagnosis   Screening mammogram detected right breast mass and right axillary lymph nodes.  Right breast nipple retraction and retroareolar thickening.  2.4 cm mass with 2 abnormal lymph nodes.  Biopsy revealed invasive lobular cancer grade 1-2 ER 95%, PR 95%, Ki-67 30%, HER2 equivocal by IHC negative by FISH ratio 1.39   07/17/2020 Cancer Staging   Staging form: Breast, AJCC 8th Edition - Clinical stage from 07/17/2020: Stage IIA (cT2, cN1, cM0, G2, ER+, PR+, HER2-) - Signed by Serena Croissant, MD on 07/17/2020   08/09/2020 Surgery   Rght lumpectomy ([facility]): invasive lobular carcinoma, 3.8cm, clear margins, 19/19 right axillary lymph nodes positive for metastatic carcinoma.    08/09/2020 Cancer Staging   Staging form: Breast, AJCC 8th Edition - Pathologic stage from 08/09/2020: Stage IIIA (pT2, pN3a, cM0, G2, ER+, PR+, HER2-) - Signed by Loa Socks, NP on 08/23/2020 Stage prefix: Initial diagnosis Histologic grading system: 3 grade system   09/15/2020 - 11/24/2020 Chemotherapy   Adjuvant chemotherapy with TC Q21D x4       01/25/2021 - 03/07/2021 Radiation Therapy   Adjuvant radiation   03/2021 -  Anti-estrogen oral therapy   Letrozole  daily Added Verzinio on 06/27/21     CHIEF COMPLIANT: Follow-up on letrozole and Verzenio  HISTORY OF PRESENT ILLNESS:   History of Present Illness   Shelby [name], a patient with a history of breast cancer, has recently completed a two-years of Verzinio. She expresses relief and satisfaction at having completed the treatment, but also shares concerns about the possibility of cancer recurrence. She is currently on anastrozole, a medication to prevent breast cancer recurrence, and is questioning the need for continued use of Verzenio, another cancer medication. She also reports frequent urinary tract infections, which she suspects may be related to her medications.  Shelby [name] also shares the unfortunate news of her sister's recent death due to breast cancer. Her sister's death has raised concerns about her own risk, prompting a discussion about the possibility of genetic testing. Shelby [name] also reports symptoms of vertigo, for which she is taking gabapentin. She is considering reducing her dosage due to improvements in her symptoms.         ALLERGIES:  is allergic to grapefruit extract, peach flavoring agent (non-screening), and codeine.  MEDICATIONS:  Current Outpatient Medications  Medication Sig Dispense Refill   gabapentin (NEURONTIN) 100 MG capsule Take 2 capsules (200 mg total) by mouth at bedtime. 180 capsule 3   letrozole (FEMARA) 2.5 MG tablet Take 1 tablet (2.5 mg total) by mouth daily. 90 tablet 3   metFORMIN (GLUCOPHAGE) 500 MG tablet Take 1 tablet (500 mg total) by mouth 2 (two) times daily with a meal. 180 tablet 1   metoprolol tartrate (LOPRESSOR) 50 MG tablet Take 1 tablet (50 mg total) by  mouth daily. 90 tablet 3   montelukast (SINGULAIR) 10 MG tablet Take 1 tablet (10 mg total) by mouth at bedtime. 90 tablet 3   pantoprazole (PROTONIX) 40 MG tablet Take 1 tablet (40 mg total) by mouth daily. 90 tablet 3   No current facility-administered medications for this visit.    PHYSICAL  EXAMINATION: ECOG PERFORMANCE STATUS: 1 - Symptomatic but completely ambulatory  Vitals:   06/09/23 0852 06/09/23 0853  BP: (!) 165/105 (!) 177/91  Pulse: (!) 101   Resp: 18   Temp: 98.5 F (36.9 C)   SpO2: 98%    There were no vitals filed for this visit.    LABORATORY DATA:  I have reviewed the data as listed    Latest Ref Rng & Units 06/09/2023    8:37 AM 01/30/2023    8:19 AM 10/30/2022    7:57 AM  CMP  Glucose 70 - 99 mg/dL 782  956  213   BUN 6 - 20 mg/dL 17  13  17    Creatinine 0.44 - 1.00 mg/dL 0.86  5.78  4.69   Sodium 135 - 145 mmol/L 141  141  143   Potassium 3.5 - 5.1 mmol/L 4.2  4.2  3.9   Chloride 98 - 111 mmol/L 105  106  107   CO2 22 - 32 mmol/L 26  25  28    Calcium 8.9 - 10.3 mg/dL 9.3  8.9  9.1   Total Protein 6.5 - 8.1 g/dL 7.1  6.8  6.8   Total Bilirubin <1.2 mg/dL 0.5  0.4  0.4   Alkaline Phos 38 - 126 U/L 79  95  95   AST 15 - 41 U/L 15  16  16    ALT 0 - 44 U/L 14  19  16      Lab Results  Component Value Date   WBC 6.1 06/09/2023   HGB 13.8 06/09/2023   HCT 40.1 06/09/2023   MCV 94.6 06/09/2023   PLT 192 06/09/2023   NEUTROABS 4.0 06/09/2023    ASSESSMENT & PLAN:  Malignant neoplasm of central portion of right breast in female, estrogen receptor positive (HCC) 07/04/2020:Screening mammogram detected right breast mass and right axillary lymph nodes.  Right breast nipple retraction and retroareolar thickening.  2.4 cm mass with 2 abnormal lymph nodes.  Biopsy revealed invasive lobular cancer grade 1-2 ER 95%, PR 95%, Ki-67 30%, HER2 equivocal by IHC negative by FISH ratio 1.39 T2N1 stage IIA   Right lumpectomy: 08/09/2020: ILC 3.8 cm, 19/19 LN Positive, Margins Neg   Plan: 1. Staging scans 2. Adjuvant chemo with TC X 6 (because of her propensity for diabetes she may only end up getting 4) 3. XRT 01/25/2021- 03/07/21 4. Adj Anti estrogen therapy (with Verzenio) started December 2022 (Verzinio completed December  2024) ------------------------------------------------------------------------------------------------------------------------------------ 08/30/2020: CT CAP: No evidence of metastatic disease 08/30/2020: Bone scan: No evidence of bone metastases --------------------------------------------------------------------------------------------------------------------  Anastrozole toxicities: Intermittent hot flashes Abemaciclib Toxicities: Denies any side effects, current dosage: 100 mg twice a day (started December 2022) With this she completed the 2 years of Verzinio therapy.  Therefore she will discontinue it at this time.  She will remain on antiestrogen therapy with anastrozole.  Breast Cancer Surveillance 1. Breast Exam: 06/09/2023: Benign 2. CT chest 07/19/21: Radiation fibrosis, Resolution of Rt Axillary LN, Hep steatosis 3. Mammogram 08/28/2022: Benign Density Cat B   Bone density 10/24/2021: T-score 0.4: Normal   Obesity: She could not see them and will make another appointment.  Chemo-induced peripheral neuropathy: Currently on gabapentin 200 mg daily. I renewed all of her medications and recommended that she see a primary care physician.  She will try to get a primary care doctor by next year.    Return to clinic in 1 year for follow-up    No orders of the defined types were placed in this encounter.  The patient has a good understanding of the overall plan. she agrees with it. she will call with any problems that may develop before the next visit here. Total time spent: 30 mins including face to face time and time spent for planning, charting and co-ordination of care   Tamsen Meek, MD 06/09/23

## 2023-06-09 NOTE — Progress Notes (Signed)
Per office visit on 06/09/23, patient has completed therapy with Verzenio. Specialty will no longer follow this medication. If patient resumes treatment with an oral medication in the future specialty will re-open the patient's account.  Jinger Neighbors, CPhT-Adv Oncology Pharmacy Patient Advocate [facility] Direct Number: [phone]  Fax: [phone]

## 2023-07-18 ENCOUNTER — Other Ambulatory Visit: Payer: Self-pay | Admitting: Hematology and Oncology

## 2023-07-18 DIAGNOSIS — Z9889 Other specified postprocedural states: Secondary | ICD-10-CM

## 2023-08-06 ENCOUNTER — Telehealth: Payer: BC Managed Care – PPO | Admitting: Family Medicine

## 2023-08-06 ENCOUNTER — Encounter: Payer: Self-pay | Admitting: Family Medicine

## 2023-08-06 DIAGNOSIS — R062 Wheezing: Secondary | ICD-10-CM | POA: Diagnosis not present

## 2023-08-06 DIAGNOSIS — R0981 Nasal congestion: Secondary | ICD-10-CM | POA: Diagnosis not present

## 2023-08-06 DIAGNOSIS — R509 Fever, unspecified: Secondary | ICD-10-CM

## 2023-08-06 DIAGNOSIS — R051 Acute cough: Secondary | ICD-10-CM

## 2023-08-06 DIAGNOSIS — R300 Dysuria: Secondary | ICD-10-CM

## 2023-08-06 DIAGNOSIS — R3989 Other symptoms and signs involving the genitourinary system: Secondary | ICD-10-CM

## 2023-08-06 MED ORDER — PREDNISONE 20 MG PO TABS: 40.0000 mg | ORAL_TABLET | Freq: Every day | ORAL | 0 refills | Status: AC

## 2023-08-06 MED ORDER — CEPHALEXIN 500 MG PO CAPS: 500.0000 mg | ORAL_CAPSULE | Freq: Two times a day (BID) | ORAL | 0 refills | Status: DC

## 2023-08-06 MED ORDER — BENZONATATE 100 MG PO CAPS: 100.0000 mg | ORAL_CAPSULE | Freq: Three times a day (TID) | ORAL | 0 refills | Status: DC | PRN

## 2023-08-06 MED ORDER — ALBUTEROL SULFATE HFA 108 (90 BASE) MCG/ACT IN AERS: 2.0000 | INHALATION_SPRAY | Freq: Four times a day (QID) | RESPIRATORY_TRACT | 2 refills | Status: DC | PRN

## 2023-08-06 NOTE — Progress Notes (Signed)
 Virtual Visit via Video   I connected with patient on 08/06/23 at 1300 by a video enabled telemedicine application and verified that I am speaking with the correct person using two identifiers.  Location patient: Home Location provider: [facility] Persons participating in the virtual visit: Patient and Provider  I discussed the limitations of evaluation and management by telemedicine and the availability of in person appointments. The patient expressed understanding and agreed to proceed.  Subjective:   Pt presents today for  Chief Complaint  Patient presents with   URI   Urinary Tract Infection   URI  This is a new problem. The current episode started yesterday. The problem has been waxing and waning. The maximum temperature recorded prior to her arrival was 100.4 - 100.9 F. The fever has been present for 1 to 2 days. Associated symptoms include congestion, coughing, dysuria, ear pain, headaches, rhinorrhea, sinus pain, a sore throat and wheezing. Pertinent negatives include no abdominal pain, chest pain, diarrhea, joint pain, joint swelling, nausea, neck pain, plugged ear sensation, rash, sneezing, swollen glands or vomiting. She has tried acetaminophen  for the symptoms. The treatment provided mild relief.  Urinary Tract Infection  This is a new problem. The current episode started in the past 7 days. The problem occurs every urination. The quality of the pain is described as aching and burning. The pain is mild. Associated symptoms include frequency and urgency. Pertinent negatives include no discharge, flank pain, hematuria, hesitancy, nausea, possible pregnancy, sweats or vomiting. She has tried increased fluids for the symptoms. The treatment provided no relief.    Review of Systems  HENT:  Positive for congestion, ear pain, rhinorrhea, sinus pain and sore throat. Negative for sneezing.   Respiratory:  Positive for cough and wheezing.    Cardiovascular:  Negative for chest pain.  Gastrointestinal:  Negative for abdominal pain, diarrhea, nausea and vomiting.  Genitourinary:  Positive for dysuria, frequency and urgency. Negative for flank pain, hematuria and hesitancy.  Musculoskeletal:  Negative for joint pain and neck pain.  Skin:  Negative for rash.  Neurological:  Positive for headaches.     Patient Active Problem List   Diagnosis Date Noted   Neuropathic pain 04/02/2022   Malignant neoplasm of central portion of right breast in female, estrogen receptor positive (HCC) 07/17/2020   Annual physical exam 07/11/2020   Wound check, abscess 07/11/2020   Allergic rhinitis 08/18/2017   Anxiety 08/18/2017   Nonhealing nonsurgical wound 05/13/2016   Gastroesophageal reflux disease without esophagitis 04/22/2016   Eustachian tube dysfunction, bilateral 04/22/2016   Moderate single current episode of major depressive disorder (HCC) 04/22/2016   Essential hypertension 03/19/2016   Hypertriglyceridemia 03/19/2016   Morbid obesity (HCC) 03/19/2016   Palpitations 03/19/2016    Social History   Tobacco Use   Smoking status: Never   Smokeless tobacco: Never  Substance Use Topics   Alcohol use: No    Current Outpatient Medications:    albuterol  (VENTOLIN  HFA) 108 (90 Base) MCG/ACT inhaler, Inhale 2 puffs into the lungs every 6 (six) hours as needed for wheezing or shortness of breath., Disp: 8 g, Rfl: 2   benzonatate  (TESSALON  PERLES) 100 MG capsule, Take 1 capsule (100 mg total) by mouth 3 (three) times daily as needed for cough., Disp: 20 capsule, Rfl: 0   cephALEXin  (KEFLEX ) 500 MG capsule, Take 1 capsule (500 mg total) by mouth 2 (two) times daily., Disp: 14 capsule, Rfl: 0   predniSONE  (DELTASONE ) 20 MG tablet,  Take 2 tablets (40 mg total) by mouth daily with breakfast for 5 days. 2 po daily for 5 days, Disp: 10 tablet, Rfl: 0   gabapentin  (NEURONTIN ) 100 MG capsule, Take 2 capsules (200 mg total) by mouth at bedtime.,  Disp: 180 capsule, Rfl: 3   letrozole  (FEMARA ) 2.5 MG tablet, Take 1 tablet (2.5 mg total) by mouth daily., Disp: 90 tablet, Rfl: 3   metFORMIN  (GLUCOPHAGE ) 500 MG tablet, Take 1 tablet (500 mg total) by mouth 2 (two) times daily with a meal., Disp: 180 tablet, Rfl: 1   metoprolol  tartrate (LOPRESSOR ) 50 MG tablet, Take 1 tablet (50 mg total) by mouth daily., Disp: 90 tablet, Rfl: 3   montelukast  (SINGULAIR ) 10 MG tablet, Take 1 tablet (10 mg total) by mouth at bedtime., Disp: 90 tablet, Rfl: 3   pantoprazole  (PROTONIX ) 40 MG tablet, Take 1 tablet (40 mg total) by mouth daily., Disp: 90 tablet, Rfl: 3  Allergies  Allergen Reactions   Grapefruit Extract     Can't have anything with grapefruit due to her cancer treatments   Peach Flavoring Agent (Non-Screening) Other (See Comments)    Severe Migraines   Codeine Palpitations    Objective:   LMP 05/13/2016   Patient is well-developed, well-nourished in no acute distress.  Resting comfortably at home.  Head is normocephalic, atraumatic.  No labored breathing.  Speech is clear and coherent with logical content.  Patient is alert and oriented at baseline.    Assessment and Plan:   Alanta was seen today for uri and urinary tract infection.  Diagnoses and all orders for this visit:  Pt unable to come in to office for testing, will treat for suspected UTI and acute viral illness. Pt aware if symptoms persist or worsen, she will need to be seen in office.    Acute cough -     benzonatate  (TESSALON  PERLES) 100 MG capsule; Take 1 capsule (100 mg total) by mouth 3 (three) times daily as needed for cough. -     predniSONE  (DELTASONE ) 20 MG tablet; Take 2 tablets (40 mg total) by mouth daily with breakfast for 5 days. 2 po daily for 5 days -     albuterol  (VENTOLIN  HFA) 108 (90 Base) MCG/ACT inhaler; Inhale 2 puffs into the lungs every 6 (six) hours as needed for wheezing or shortness of breath.  Nasal congestion -     benzonatate  (TESSALON   PERLES) 100 MG capsule; Take 1 capsule (100 mg total) by mouth 3 (three) times daily as needed for cough. -     predniSONE  (DELTASONE ) 20 MG tablet; Take 2 tablets (40 mg total) by mouth daily with breakfast for 5 days. 2 po daily for 5 days -     albuterol  (VENTOLIN  HFA) 108 (90 Base) MCG/ACT inhaler; Inhale 2 puffs into the lungs every 6 (six) hours as needed for wheezing or shortness of breath.  Fever and chills -     benzonatate  (TESSALON  PERLES) 100 MG capsule; Take 1 capsule (100 mg total) by mouth 3 (three) times daily as needed for cough. -     predniSONE  (DELTASONE ) 20 MG tablet; Take 2 tablets (40 mg total) by mouth daily with breakfast for 5 days. 2 po daily for 5 days -     albuterol  (VENTOLIN  HFA) 108 (90 Base) MCG/ACT inhaler; Inhale 2 puffs into the lungs every 6 (six) hours as needed for wheezing or shortness of breath.  Wheezing -     benzonatate  (TESSALON  PERLES) 100 MG  capsule; Take 1 capsule (100 mg total) by mouth 3 (three) times daily as needed for cough. -     predniSONE  (DELTASONE ) 20 MG tablet; Take 2 tablets (40 mg total) by mouth daily with breakfast for 5 days. 2 po daily for 5 days -     albuterol  (VENTOLIN  HFA) 108 (90 Base) MCG/ACT inhaler; Inhale 2 puffs into the lungs every 6 (six) hours as needed for wheezing or shortness of breath.  Dysuria -     cephALEXin  (KEFLEX ) 500 MG capsule; Take 1 capsule (500 mg total) by mouth 2 (two) times daily.  Suspected UTI -     cephALEXin  (KEFLEX ) 500 MG capsule; Take 1 capsule (500 mg total) by mouth 2 (two) times daily.      Return if symptoms worsen or fail to improve.  Rosaline Bruns, FNP-C [facility] [address] [place], [place] [address] [phone]  08/06/2023  Time spent with the patient: 15 minutes, of which >50% was spent in obtaining information about symptoms, reviewing previous labs, evaluations, and treatments, counseling about condition (please see the discussed topics  above), and developing a plan to further investigate it; had a number of questions which I addressed.

## 2023-09-02 ENCOUNTER — Ambulatory Visit
Admission: RE | Admit: 2023-09-02 | Discharge: 2023-09-02 | Disposition: A | Discharge status: Home / Self Care | Payer: BC Managed Care – PPO | Source: Ambulatory Visit | Attending: Hematology and Oncology

## 2023-09-02 DIAGNOSIS — Z853 Personal history of malignant neoplasm of breast: Secondary | ICD-10-CM | POA: Diagnosis not present

## 2023-09-02 DIAGNOSIS — Z9889 Other specified postprocedural states: Secondary | ICD-10-CM

## 2023-10-31 ENCOUNTER — Other Ambulatory Visit: Payer: Self-pay | Admitting: Hematology and Oncology

## 2023-12-31 ENCOUNTER — Other Ambulatory Visit: Payer: Self-pay | Admitting: Hematology and Oncology

## 2023-12-31 DIAGNOSIS — R7303 Prediabetes: Secondary | ICD-10-CM

## 2024-04-08 DIAGNOSIS — H9202 Otalgia, left ear: Secondary | ICD-10-CM | POA: Diagnosis not present

## 2024-04-08 DIAGNOSIS — R319 Hematuria, unspecified: Secondary | ICD-10-CM | POA: Diagnosis not present

## 2024-04-08 DIAGNOSIS — R300 Dysuria: Secondary | ICD-10-CM | POA: Diagnosis not present

## 2024-05-13 DIAGNOSIS — M79662 Pain in left lower leg: Secondary | ICD-10-CM | POA: Diagnosis not present

## 2024-05-13 DIAGNOSIS — N3001 Acute cystitis with hematuria: Secondary | ICD-10-CM | POA: Diagnosis not present

## 2024-05-13 DIAGNOSIS — M79605 Pain in left leg: Secondary | ICD-10-CM | POA: Diagnosis not present

## 2024-06-10 ENCOUNTER — Inpatient Hospital Stay: Payer: BC Managed Care – PPO | Attending: Hematology and Oncology | Admitting: Hematology and Oncology

## 2024-06-10 VITALS — BP 130/80 | HR 76 | Temp 98.7°F | Resp 18 | Wt 353.2 lb

## 2024-06-10 DIAGNOSIS — R002 Palpitations: Secondary | ICD-10-CM | POA: Diagnosis not present

## 2024-06-10 DIAGNOSIS — R7303 Prediabetes: Secondary | ICD-10-CM | POA: Diagnosis not present

## 2024-06-10 DIAGNOSIS — R232 Flushing: Secondary | ICD-10-CM | POA: Diagnosis not present

## 2024-06-10 DIAGNOSIS — C773 Secondary and unspecified malignant neoplasm of axilla and upper limb lymph nodes: Secondary | ICD-10-CM | POA: Insufficient documentation

## 2024-06-10 DIAGNOSIS — K219 Gastro-esophageal reflux disease without esophagitis: Secondary | ICD-10-CM

## 2024-06-10 DIAGNOSIS — Z1732 Human epidermal growth factor receptor 2 negative status: Secondary | ICD-10-CM | POA: Insufficient documentation

## 2024-06-10 DIAGNOSIS — C50111 Malignant neoplasm of central portion of right female breast: Secondary | ICD-10-CM | POA: Diagnosis not present

## 2024-06-10 DIAGNOSIS — Z170 Estrogen receptor positive status [ER+]: Secondary | ICD-10-CM | POA: Insufficient documentation

## 2024-06-10 DIAGNOSIS — G620 Drug-induced polyneuropathy: Secondary | ICD-10-CM | POA: Insufficient documentation

## 2024-06-10 DIAGNOSIS — M792 Neuralgia and neuritis, unspecified: Secondary | ICD-10-CM | POA: Diagnosis not present

## 2024-06-10 DIAGNOSIS — I10 Essential (primary) hypertension: Secondary | ICD-10-CM

## 2024-06-10 DIAGNOSIS — J309 Allergic rhinitis, unspecified: Secondary | ICD-10-CM | POA: Diagnosis not present

## 2024-06-10 DIAGNOSIS — Z1721 Progesterone receptor positive status: Secondary | ICD-10-CM | POA: Diagnosis not present

## 2024-06-10 DIAGNOSIS — Z923 Personal history of irradiation: Secondary | ICD-10-CM | POA: Insufficient documentation

## 2024-06-10 DIAGNOSIS — E669 Obesity, unspecified: Secondary | ICD-10-CM | POA: Diagnosis not present

## 2024-06-10 DIAGNOSIS — Z79811 Long term (current) use of aromatase inhibitors: Secondary | ICD-10-CM | POA: Insufficient documentation

## 2024-06-10 DIAGNOSIS — Z9221 Personal history of antineoplastic chemotherapy: Secondary | ICD-10-CM | POA: Diagnosis not present

## 2024-06-10 MED ORDER — MONTELUKAST SODIUM 10 MG PO TABS: 10.0000 mg | ORAL_TABLET | Freq: Every day | ORAL | 3 refills | Status: AC

## 2024-06-10 MED ORDER — PANTOPRAZOLE SODIUM 40 MG PO TBEC: 40.0000 mg | DELAYED_RELEASE_TABLET | Freq: Every day | ORAL | 3 refills | Status: AC

## 2024-06-10 MED ORDER — METOPROLOL TARTRATE 50 MG PO TABS: 50.0000 mg | ORAL_TABLET | Freq: Every day | ORAL | 3 refills | Status: AC

## 2024-06-10 MED ORDER — METFORMIN HCL 500 MG PO TABS: 500.0000 mg | ORAL_TABLET | Freq: Two times a day (BID) | ORAL | 1 refills | Status: AC

## 2024-06-10 MED ORDER — GABAPENTIN 100 MG PO CAPS: 200.0000 mg | ORAL_CAPSULE | Freq: Every day | ORAL | 3 refills | Status: AC

## 2024-06-10 NOTE — Assessment & Plan Note (Signed)
 07/04/2020:Screening mammogram detected right breast mass and right axillary lymph nodes.  Right breast nipple retraction and retroareolar thickening.  2.4 cm mass with 2 abnormal lymph nodes.  Biopsy revealed invasive lobular cancer grade 1-2 ER 95%, PR 95%, Ki-67 30%, HER2 equivocal by IHC negative by FISH ratio 1.39 T2N1 stage IIA   Right lumpectomy: 08/09/2020: ILC 3.8 cm, 19/19 LN Positive, Margins Neg   Plan: 1. Staging scans 2. Adjuvant chemo with TC X 6 (because of her propensity for diabetes she may only end up getting 4) 3. XRT 01/25/2021- 03/07/21 4. Adj Anti estrogen therapy (with Verzenio ) started December 2022 (Verzinio completed December 2024) ------------------------------------------------------------------------------------------------------------------------------------ 08/30/2020: CT CAP: No evidence of metastatic disease 08/30/2020: Bone scan: No evidence of bone metastases --------------------------------------------------------------------------------------------------------------------  Anastrozole toxicities: Intermittent hot flashes Abemaciclib  Toxicities: Denies any side effects, current dosage: 100 mg twice a day (started December 2022) With this she completed the 2 years of Verzinio therapy.  Therefore she will discontinue it at this time.  She will remain on antiestrogen therapy with anastrozole.   Breast Cancer Surveillance 1. Breast Exam: 06/10/2024: Benign 2. CT chest 07/19/21: Radiation fibrosis, Resolution of Rt Axillary LN, Hep steatosis 3. Mammogram 09/02/2023: Benign Density Cat B   Bone density 10/24/2021: T-score 0.4: Normal   Obesity:  Chemo-induced peripheral neuropathy: Currently on gabapentin  200 mg daily. I renewed all of her medications and recommended that she see a primary care physician.  She will try to get a primary care doctor by next year.     Return to clinic in 1 year for follow-up

## 2024-06-10 NOTE — Progress Notes (Signed)
 Patient Care Team: Severa Rock HERO, FNP as PCP - General (Family Medicine) Odean Potts, MD as Consulting Physician (Hematology and Oncology) Izell Domino, MD as Attending Physician (Radiation Oncology) Vanderbilt Ned, MD as Consulting Physician (General Surgery)  DIAGNOSIS:  Encounter Diagnoses  Name Primary?   Malignant neoplasm of central portion of right breast in female, estrogen receptor positive (HCC) Yes   Neuropathic pain    Prediabetes    Palpitations    Essential hypertension    Chronic allergic rhinitis    Gastroesophageal reflux disease without esophagitis     SUMMARY OF ONCOLOGIC HISTORY: Oncology History  Malignant neoplasm of central portion of right breast in female, estrogen receptor positive (HCC)  07/04/2020 Initial Diagnosis   Screening mammogram detected right breast mass and right axillary lymph nodes.  Right breast nipple retraction and retroareolar thickening.  2.4 cm mass with 2 abnormal lymph nodes.  Biopsy revealed invasive lobular cancer grade 1-2 ER 95%, PR 95%, Ki-67 30%, HER2 equivocal by IHC negative by FISH ratio 1.39   07/17/2020 Cancer Staging   Staging form: Breast, AJCC 8th Edition - Clinical stage from 07/17/2020: Stage IIA (cT2, cN1, cM0, G2, ER+, PR+, HER2-) - Signed by Odean Potts, MD on 07/17/2020   08/09/2020 Surgery   Rght lumpectomy ([facility]): invasive lobular carcinoma, 3.8cm, clear margins, 19/19 right axillary lymph nodes positive for metastatic carcinoma.    08/09/2020 Cancer Staging   Staging form: Breast, AJCC 8th Edition - Pathologic stage from 08/09/2020: Stage IIIA (pT2, pN3a, cM0, G2, ER+, PR+, HER2-) - Signed by Crawford Morna Pickle, NP on 08/23/2020 Stage prefix: Initial diagnosis Histologic grading system: 3 grade system   09/15/2020 - 11/24/2020 Chemotherapy   Adjuvant chemotherapy with TC Q21D x4       01/25/2021 - 03/07/2021 Radiation Therapy   Adjuvant radiation   03/2021 -  Anti-estrogen oral therapy   Letrozole   daily Added Verzinio on 06/27/21     CHIEF COMPLIANT: Surveillance of breast cancer  HISTORY OF PRESENT ILLNESS:  History of Present Illness Shelby [name] is a 53 year old woman with stage IIIA ER+, PR+, HER2- right breast invasive lobular carcinoma in remission who presents for oncology follow-up due to severe leg pain attributed to aromatase inhibitor therapy.  She completed two years of adjuvant Verzenio  in December 2024 and is currently taking anastrozole.  Since starting anastrozole she has had 3 to 4 months of severe persistent leg pain, described as a toothache-like aching in the joints, worst after inactivity and improving with movement. Pain has worsened since stopping Verzenio  and now markedly limits exercise and sleep, with only one hour of sleep the night before this visit.  Gabapentin  for neuropathy did not help. She uses intermittent Aleve but prefers to avoid regular analgesics. Muscle relaxers during a recent hospitalization gave temporary relief. At that visit a blood clot was ruled out and neuropathy was suggested. She has not tried Voltaren gel or natural topical treatments and is interested in these. She has continued anastrozole out of concern for recurrence but is open to a trial discontinuation as recommended.  She has lost 77 pounds since May on a 1200 calorie diet and continues to lose weight, which she feels has improved her overall health, though leg pain limits further activity. She is highly motivated to avoid cancer recurrence and maintain her health. She has not started cayenne pepper extract due to safety concerns. Mammograms are up to date, with the next planned for March.       ALLERGIES:  is allergic to grapefruit extract, peach flavoring agent (non-screening), and codeine.  MEDICATIONS:  Current Outpatient Medications  Medication Sig Dispense Refill   letrozole  (FEMARA ) 2.5 MG tablet TAKE 1 TABLET BY MOUTH EVERY DAY 90 tablet 3   gabapentin  (NEURONTIN )  100 MG capsule Take 2 capsules (200 mg total) by mouth at bedtime. 180 capsule 3   metFORMIN  (GLUCOPHAGE ) 500 MG tablet Take 1 tablet (500 mg total) by mouth 2 (two) times daily with a meal. 180 tablet 1   metoprolol  tartrate (LOPRESSOR ) 50 MG tablet Take 1 tablet (50 mg total) by mouth daily. 90 tablet 3   montelukast  (SINGULAIR ) 10 MG tablet Take 1 tablet (10 mg total) by mouth at bedtime. 90 tablet 3   pantoprazole  (PROTONIX ) 40 MG tablet Take 1 tablet (40 mg total) by mouth daily. 90 tablet 3   No current facility-administered medications for this visit.    PHYSICAL EXAMINATION: ECOG PERFORMANCE STATUS: 1 - Symptomatic but completely ambulatory  Vitals:   06/10/24 0908  BP: 130/80  Pulse: 76  Resp: 18  Temp: 98.7 F (37.1 C)  SpO2: 99%   Filed Weights   06/10/24 0908  Weight: (!) 353 lb 3.2 oz (160.2 kg)     LABORATORY DATA:  I have reviewed the data as listed    Latest Ref Rng & Units 06/09/2023    8:37 AM 01/30/2023    8:19 AM 10/30/2022    7:57 AM  CMP  Glucose 70 - 99 mg/dL 873  885  868   BUN 6 - 20 mg/dL 17  13  17    Creatinine 0.44 - 1.00 mg/dL 9.12  9.10  9.05   Sodium 135 - 145 mmol/L 141  141  143   Potassium 3.5 - 5.1 mmol/L 4.2  4.2  3.9   Chloride 98 - 111 mmol/L 105  106  107   CO2 22 - 32 mmol/L 26  25  28    Calcium  8.9 - 10.3 mg/dL 9.3  8.9  9.1   Total Protein 6.5 - 8.1 g/dL 7.1  6.8  6.8   Total Bilirubin <1.2 mg/dL 0.5  0.4  0.4   Alkaline Phos 38 - 126 U/L 79  95  95   AST 15 - 41 U/L 15  16  16    ALT 0 - 44 U/L 14  19  16      Lab Results  Component Value Date   WBC 6.1 06/09/2023   HGB 13.8 06/09/2023   HCT 40.1 06/09/2023   MCV 94.6 06/09/2023   PLT 192 06/09/2023   NEUTROABS 4.0 06/09/2023    ASSESSMENT & PLAN:  Malignant neoplasm of central portion of right breast in female, estrogen receptor positive (HCC) 07/04/2020:Screening mammogram detected right breast mass and right axillary lymph nodes.  Right breast nipple retraction and  retroareolar thickening.  2.4 cm mass with 2 abnormal lymph nodes.  Biopsy revealed invasive lobular cancer grade 1-2 ER 95%, PR 95%, Ki-67 30%, HER2 equivocal by IHC negative by FISH ratio 1.39 T2N1 stage IIA   Right lumpectomy: 08/09/2020: ILC 3.8 cm, 19/19 LN Positive, Margins Neg   Plan: 1. Staging scans 2. Adjuvant chemo with TC X 4 (because of her propensity for diabetes we gave her 4 cycles) 3. XRT 01/25/2021- 03/07/21 4. Adj Anti estrogen therapy (with Verzenio ) started December 2022 (Verzinio completed December 2024) ------------------------------------------------------------------------------------------------------------------------------------ 08/30/2020: CT CAP: No evidence of metastatic disease 08/30/2020: Bone scan: No evidence of bone metastases --------------------------------------------------------------------------------------------------------------------  Anastrozole toxicities: Intermittent hot  flashes   Breast Cancer Surveillance 1. Breast Exam: 06/10/2024: Benign 2. CT chest 07/19/21: Radiation fibrosis, Resolution of Rt Axillary LN, Hep steatosis 3. Mammogram 09/02/2023: Benign Density Cat B 4.  Recommended guardant reveal for MRD testing   Bone density 10/24/2021: T-score 0.4: Normal   Obesity: She lost 70 pounds on her own by eating 1200-calorie daily meals.  Chemo-induced peripheral neuropathy: Currently on gabapentin  200 mg daily. I renewed all of her medications and recommended that she see a primary care physician.  She will try to get a primary care doctor by next year.   Leg pain: I recommended discontinuation of anastrozole therapy for the next 3 weeks.  If her symptoms improve then we will consider an alternate antiestrogen therapy. I will call her in 1 month to discuss results of stopping anastrozole.   ------------------------------------- Assessment and Plan Assessment & Plan Estrogen receptor positive right breast cancer Joint and leg pain likely due  to aromatase inhibitor toxicity. - Discontinued anastrozole for three weeks to assess pain improvement and etiology. - Phone follow-up in early January to reassess symptoms and plan further. - Switch to alternative aromatase inhibitor if symptoms improve post-discontinuation. - Refer to orthopedics if symptoms persist. - Confirmed short-term anastrozole discontinuation does not increase recurrence risk. - Mammograms current; next in March.  Neuropathic pain Severe neuropathic leg pain impacting sleep and activities, likely multifactorial from chemotherapy and aromatase inhibitor. Gabapentin  ineffective; patient prefers non-pharmacologic remedies. - Trial of over-the-counter topical diclofenac for joint pain relief. - Discussed natural remedy: olive oil with lemon and orange peel for joint pain. - Renewed gabapentin  prescription. - Consider alternative aromatase inhibitor if pain resolves post-discontinuation. - Refer to orthopedics if pain persists.      No orders of the defined types were placed in this encounter.  The patient has a good understanding of the overall plan. she agrees with it. she will call with any problems that may develop before the next visit here.  I personally spent a total of 30 minutes in the care of the patient today including preparing to see the patient, getting/reviewing separately obtained history, performing a medically appropriate exam/evaluation, counseling and educating, placing orders, referring and communicating with other health care professionals, documenting clinical information in the EHR, independently interpreting results, communicating results, and coordinating care.   Viinay K Phillis Thackeray, MD 06/10/2024

## 2024-07-05 ENCOUNTER — Telehealth: Payer: Self-pay | Admitting: Family Medicine

## 2024-07-05 DIAGNOSIS — Z170 Estrogen receptor positive status [ER+]: Secondary | ICD-10-CM

## 2024-07-05 NOTE — Progress Notes (Signed)
 Patient is regarding x-ray findings suggestive of metastatic disease in the femur. I ordered a CT chest abdomen pelvis and bone scan to be done this week. Follow-up next week to discuss results.

## 2024-07-05 NOTE — Telephone Encounter (Signed)
 I spoke to pt and she currently has an oncologist and they are ordering the scan, nothing further needed.

## 2024-07-05 NOTE — Telephone Encounter (Signed)
 Copied from CRM [id number]. Topic: Referral - Question >> Jul 05, 2024  2:57 PM Susanna ORN wrote: Reason for CRM: Delon, with [facility], is calling in stating that they haven't seen the patient before & the ED is requesting for her to have a referral to see an oncologist. They think she may have cancer in her leg. Delon states they do not have any results or anything & they were wondering if her pcp had any recommendations & if she's able to send a referral for an orthopedic oncologist. For further questions or concerns, please give Delon a call back. CB #: [phone].

## 2024-07-06 ENCOUNTER — Inpatient Hospital Stay: Admitting: Hematology and Oncology

## 2024-07-07 ENCOUNTER — Encounter (HOSPITAL_COMMUNITY)
Admission: RE | Admit: 2024-07-07 | Discharge: 2024-07-07 | Disposition: A | Discharge status: Home / Self Care | Source: Ambulatory Visit | Attending: Hematology and Oncology | Admitting: Hematology and Oncology

## 2024-07-07 ENCOUNTER — Ambulatory Visit (HOSPITAL_COMMUNITY)
Admission: RE | Admit: 2024-07-07 | Discharge: 2024-07-07 | Disposition: A | Discharge status: Home / Self Care | Source: Ambulatory Visit | Attending: Hematology and Oncology | Admitting: Hematology and Oncology

## 2024-07-07 DIAGNOSIS — Z170 Estrogen receptor positive status [ER+]: Secondary | ICD-10-CM | POA: Insufficient documentation

## 2024-07-07 DIAGNOSIS — C50111 Malignant neoplasm of central portion of right female breast: Secondary | ICD-10-CM | POA: Diagnosis present

## 2024-07-07 MED ORDER — TECHNETIUM TC 99M MEDRONATE IV KIT
20.0000 | PACK | Freq: Once | INTRAVENOUS | Status: AC | PRN
  Administered 2024-07-07: 20.5 via INTRAVENOUS

## 2024-07-07 MED ORDER — IOHEXOL 300 MG/ML  SOLN
100.0000 mL | Freq: Once | INTRAMUSCULAR | Status: AC | PRN
  Administered 2024-07-07: 100 mL via INTRAVENOUS

## 2024-07-08 ENCOUNTER — Other Ambulatory Visit: Payer: Self-pay | Admitting: Hematology and Oncology

## 2024-07-08 MED ORDER — CIPROFLOXACIN HCL 500 MG PO TABS: 500.0000 mg | ORAL_TABLET | Freq: Two times a day (BID) | ORAL | 0 refills | Status: AC

## 2024-07-08 MED ORDER — ALPRAZOLAM 0.25 MG PO TABS: 0.2500 mg | ORAL_TABLET | Freq: Every evening | ORAL | 0 refills | Status: AC | PRN

## 2024-07-08 NOTE — Progress Notes (Signed)
 UTI symptoms: Sent for ciprofloxacin  Severe anxiety regarding scan results: Sent 6 tablets of Xanax .  She will use 1 tablet once or twice a day as needed.

## 2024-07-13 ENCOUNTER — Inpatient Hospital Stay: Attending: Hematology and Oncology

## 2024-07-13 ENCOUNTER — Other Ambulatory Visit: Payer: Self-pay

## 2024-07-13 ENCOUNTER — Inpatient Hospital Stay

## 2024-07-13 ENCOUNTER — Inpatient Hospital Stay: Attending: Hematology and Oncology | Admitting: Hematology and Oncology

## 2024-07-13 ENCOUNTER — Telehealth: Payer: Self-pay | Admitting: Radiation Oncology

## 2024-07-13 ENCOUNTER — Other Ambulatory Visit: Payer: Self-pay | Admitting: *Deleted

## 2024-07-13 VITALS — BP 129/101 | HR 68 | Temp 98.6°F | Resp 18 | Wt 349.0 lb

## 2024-07-13 DIAGNOSIS — E669 Obesity, unspecified: Secondary | ICD-10-CM | POA: Insufficient documentation

## 2024-07-13 DIAGNOSIS — C7951 Secondary malignant neoplasm of bone: Secondary | ICD-10-CM | POA: Insufficient documentation

## 2024-07-13 DIAGNOSIS — Z1732 Human epidermal growth factor receptor 2 negative status: Secondary | ICD-10-CM | POA: Insufficient documentation

## 2024-07-13 DIAGNOSIS — C50111 Malignant neoplasm of central portion of right female breast: Secondary | ICD-10-CM | POA: Diagnosis not present

## 2024-07-13 DIAGNOSIS — Z1721 Progesterone receptor positive status: Secondary | ICD-10-CM | POA: Insufficient documentation

## 2024-07-13 DIAGNOSIS — Z923 Personal history of irradiation: Secondary | ICD-10-CM | POA: Insufficient documentation

## 2024-07-13 DIAGNOSIS — Z170 Estrogen receptor positive status [ER+]: Secondary | ICD-10-CM | POA: Insufficient documentation

## 2024-07-13 DIAGNOSIS — Z9221 Personal history of antineoplastic chemotherapy: Secondary | ICD-10-CM | POA: Diagnosis not present

## 2024-07-13 DIAGNOSIS — Z79811 Long term (current) use of aromatase inhibitors: Secondary | ICD-10-CM | POA: Insufficient documentation

## 2024-07-13 DIAGNOSIS — Z5111 Encounter for antineoplastic chemotherapy: Secondary | ICD-10-CM | POA: Insufficient documentation

## 2024-07-13 DIAGNOSIS — G620 Drug-induced polyneuropathy: Secondary | ICD-10-CM | POA: Insufficient documentation

## 2024-07-13 MED ORDER — FULVESTRANT 250 MG/5ML IM SOSY
500.0000 mg | PREFILLED_SYRINGE | Freq: Once | INTRAMUSCULAR | Status: AC
  Administered 2024-07-13: 500 mg via INTRAMUSCULAR
  Filled 2024-07-13: qty 10

## 2024-07-13 NOTE — Telephone Encounter (Signed)
 Spoke to pt to schedule urgent consult and ct sim. Pt agreeable to appts 1/14. Team informed via email.

## 2024-07-13 NOTE — Progress Notes (Signed)
 "  Patient Care Team: Rakes, Rock HERO, FNP as PCP - General (Family Medicine) Odean Potts, MD as Consulting Physician (Hematology and Oncology) Izell Domino, MD as Attending Physician (Radiation Oncology) Vanderbilt Ned, MD as Consulting Physician (General Surgery)  DIAGNOSIS:  Encounter Diagnosis  Name Primary?   Malignant neoplasm of central portion of right breast in female, estrogen receptor positive (HCC) Yes    SUMMARY OF ONCOLOGIC HISTORY: Oncology History  Malignant neoplasm of central portion of right breast in female, estrogen receptor positive (HCC)  07/04/2020 Initial Diagnosis   Screening mammogram detected right breast mass and right axillary lymph nodes.  Right breast nipple retraction and retroareolar thickening.  2.4 cm mass with 2 abnormal lymph nodes.  Biopsy revealed invasive lobular cancer grade 1-2 ER 95%, PR 95%, Ki-67 30%, HER2 equivocal by IHC negative by FISH ratio 1.39   07/17/2020 Cancer Staging   Staging form: Breast, AJCC 8th Edition - Clinical stage from 07/17/2020: Stage IIA (cT2, cN1, cM0, G2, ER+, PR+, HER2-) - Signed by Odean Potts, MD on 07/17/2020   08/09/2020 Surgery   Rght lumpectomy ([facility]): invasive lobular carcinoma, 3.8cm, clear margins, 19/19 right axillary lymph nodes positive for metastatic carcinoma.    08/09/2020 Cancer Staging   Staging form: Breast, AJCC 8th Edition - Pathologic stage from 08/09/2020: Stage IIIA (pT2, pN3a, cM0, G2, ER+, PR+, HER2-) - Signed by Crawford Morna Pickle, NP on 08/23/2020 Stage prefix: Initial diagnosis Histologic grading system: 3 grade system   09/15/2020 - 11/24/2020 Chemotherapy   Adjuvant chemotherapy with TC Q21D x4       01/25/2021 - 03/07/2021 Radiation Therapy   Adjuvant radiation   03/2021 -  Anti-estrogen oral therapy   Letrozole  daily Added Verzinio on 06/27/21     CHIEF COMPLIANT: Follow-up after recent CT scans and bone scans  HISTORY OF PRESENT ILLNESS:   History of Present  Illness Shelby [name] is a 54 year old female with metastatic estrogen receptor positive breast cancer who presents with new left leg pain and recent imaging findings of osseous metastases.  She has persistent left hip and mid femur pain without radiation, remains ambulatory without gait difficulty, and has had no recent falls. She is concerned about fracture risk in the affected leg, its relationship to her breast cancer, and the potential for further progression and prognosis.  Recent imaging showed an abnormality in the left femur. CT and bone scan demonstrated multiple areas of increased osseous uptake, most prominent in the mid left femur corresponding to her pain. Additional lesions were seen in the right fourth and fifth ribs, with possible sternum and spine lesions on CT that were not active on bone scan.  She previously completed Verzenio , last taken in 2024, and recently stopped letrozole  while awaiting further oncology guidance.     ALLERGIES:  is allergic to grapefruit extract, peach flavoring agent (non-screening), and codeine.  MEDICATIONS:  Current Outpatient Medications  Medication Sig Dispense Refill   ALPRAZolam  (XANAX ) 0.25 MG tablet Take 1 tablet (0.25 mg total) by mouth at bedtime as needed for anxiety. 6 tablet 0   ciprofloxacin  (CIPRO ) 500 MG tablet Take 1 tablet (500 mg total) by mouth 2 (two) times daily. 14 tablet 0   gabapentin  (NEURONTIN ) 100 MG capsule Take 2 capsules (200 mg total) by mouth at bedtime. 180 capsule 3   letrozole  (FEMARA ) 2.5 MG tablet TAKE 1 TABLET BY MOUTH EVERY DAY 90 tablet 3   metFORMIN  (GLUCOPHAGE ) 500 MG tablet Take 1 tablet (500 mg total) by mouth  2 (two) times daily with a meal. 180 tablet 1   metoprolol  tartrate (LOPRESSOR ) 50 MG tablet Take 1 tablet (50 mg total) by mouth daily. 90 tablet 3   montelukast  (SINGULAIR ) 10 MG tablet Take 1 tablet (10 mg total) by mouth at bedtime. 90 tablet 3   pantoprazole  (PROTONIX ) 40 MG tablet Take 1  tablet (40 mg total) by mouth daily. 90 tablet 3   No current facility-administered medications for this visit.    PHYSICAL EXAMINATION: ECOG PERFORMANCE STATUS: 1 - Symptomatic but completely ambulatory  Vitals:   07/13/24 1142 07/13/24 1143  BP: (!) 140/101 (!) 129/101  Pulse: 68   Resp: 18   Temp: 98.6 F (37 C)   SpO2: 99%    Filed Weights   07/13/24 1142  Weight: (!) 349 lb (158.3 kg)     LABORATORY DATA:  I have reviewed the data as listed    Latest Ref Rng & Units 06/09/2023    8:37 AM 01/30/2023    8:19 AM 10/30/2022    7:57 AM  CMP  Glucose 70 - 99 mg/dL 873  885  868   BUN 6 - 20 mg/dL 17  13  17    Creatinine 0.44 - 1.00 mg/dL 9.12  9.10  9.05   Sodium 135 - 145 mmol/L 141  141  143   Potassium 3.5 - 5.1 mmol/L 4.2  4.2  3.9   Chloride 98 - 111 mmol/L 105  106  107   CO2 22 - 32 mmol/L 26  25  28    Calcium  8.9 - 10.3 mg/dL 9.3  8.9  9.1   Total Protein 6.5 - 8.1 g/dL 7.1  6.8  6.8   Total Bilirubin <1.2 mg/dL 0.5  0.4  0.4   Alkaline Phos 38 - 126 U/L 79  95  95   AST 15 - 41 U/L 15  16  16    ALT 0 - 44 U/L 14  19  16      Lab Results  Component Value Date   WBC 6.1 06/09/2023   HGB 13.8 06/09/2023   HCT 40.1 06/09/2023   MCV 94.6 06/09/2023   PLT 192 06/09/2023   NEUTROABS 4.0 06/09/2023    ASSESSMENT & PLAN:  Malignant neoplasm of central portion of right breast in female, estrogen receptor positive (HCC) 07/04/2020:Screening mammogram detected right breast mass and right axillary lymph nodes.  Right breast nipple retraction and retroareolar thickening.  2.4 cm mass with 2 abnormal lymph nodes.  Biopsy revealed invasive lobular cancer grade 1-2 ER 95%, PR 95%, Ki-67 30%, HER2 equivocal by IHC negative by FISH ratio 1.39 T2N1 stage IIA   Right lumpectomy: 08/09/2020: ILC 3.8 cm, 19/19 LN Positive, Margins Neg   Plan: 1. Staging scans 2. Adjuvant chemo with TC X 4 (because of her propensity for diabetes we gave her 4 cycles) 3. XRT 01/25/2021-  03/07/21 4. Adj Anti estrogen therapy (with Verzenio ) started December 2022 (Verzinio completed December 2024) ------------------------------------------------------------------------------------------------------------------------------------ 08/30/2020: CT CAP: No evidence of metastatic disease 08/30/2020: Bone scan: No evidence of bone metastases --------------------------------------------------------------------------------------------------------------------  Anastrozole toxicities: Intermittent hot flashes Leg pain: Held anastrozole for 3 weeks   Breast Cancer Surveillance 1. Breast Exam: 06/10/2024: Benign 2. CT chest 07/19/21: Radiation fibrosis, Resolution of Rt Axillary LN, Hep steatosis 3. Mammogram 09/02/2023: Benign Density Cat B 4.  Recommended guardant reveal for MRD testing   Bone density 10/24/2021: T-score 0.4: Normal   Obesity: She lost 70 pounds on her own by eating 1200-calorie  daily meals.   Chemo-induced peripheral neuropathy: Currently on gabapentin  200 mg daily.  X-ray suggestive of metastatic disease to the femur. CT CAP and bone scan 07/07/2024: Multiple new sclerotic lesions within the axial and proximal appendicular skeleton.  No pathological fractures.  Bone scan revealedLeft femur midshaft lesion as well as new uptake right 4th or 5th rib lesions  Plan: Referral to orthopedics: I sent a message to Dr. Vernetta to see if she needs to have stabilization surgery prior to radiation Probably to radiation for pain control Once we have more tissue we can run breast prognostic panel and Caris molecular testing.   Since she has progressed on letrozole  therapy, we will switch her to Faslodex  injections Bone metastasis: Recommended Xgeva   Return to clinic along with Faslodex  injections    Orders Placed This Encounter  Procedures   Guardant 360    Standing Status:   Future    Number of Occurrences:   1    Expected Date:   07/13/2024    Expiration Date:   07/13/2025    The patient has a good understanding of the overall plan. she agrees with it. she will call with any problems that may develop before the next visit here.  I personally spent a total of 45 minutes in the care of the patient today including preparing to see the patient, getting/reviewing separately obtained history, performing a medically appropriate exam/evaluation, counseling and educating, placing orders, referring and communicating with other health care professionals, documenting clinical information in the EHR, independently interpreting results, communicating results, and coordinating care.   Viinay K Arkin Imran, MD 07/13/2024    "

## 2024-07-13 NOTE — Progress Notes (Signed)
 Received call from [facility] stating they do not treat pts with bone mets.  RN reached out to [facility] and Urgent orders placed for Dr. Vernetta.

## 2024-07-13 NOTE — Progress Notes (Signed)
 Per MD urgent referral sent to [facility].

## 2024-07-13 NOTE — Progress Notes (Signed)
 " Radiation Oncology         [phone] ________________________________  Initial outpatient Consultation  Name: Shelby [name] MRN: [id number]  Date: 07/14/2024  DOB: 12-20-1970  RR:Mjxzd, Rock HERO, FNP  Odean Potts, MD   REFERRING PHYSICIAN: Odean Potts, MD  DIAGNOSIS: No diagnosis found.   Cancer Staging  Malignant neoplasm of central portion of right breast in female, estrogen receptor positive (HCC) Staging form: Breast, AJCC 8th Edition - Clinical stage from 07/17/2020: Stage IIA (cT2, cN1, cM0, G2, ER+, PR+, HER2-) - Signed by Odean Potts, MD on 07/17/2020 Stage prefix: Initial diagnosis - Pathologic stage from 08/09/2020: Stage IIIA (pT2, pN3a, cM0, G2, ER+, PR+, HER2-) - Signed by Crawford Morna Pickle, NP on 08/23/2020 Stage prefix: Initial diagnosis Histologic grading system: 3 grade system   Malignant neoplasm of central portion of right breast in female, estrogen receptor positive (HCC)   CHIEF COMPLAINT: Here to discuss management of bone metastasis- right breast cancer primary.  HISTORY OF PRESENT ILLNESS::Shelby [name] is a 54 y.o. female with a history of  right breast invasive lobular cancer diagnosed in 2022 s.p a right breast lumpectomy under the care of Dr. Vanderbilt along with chemotherapy and radiation. She has been on antiestrogen oral therapy with letrozole  and verzinio (stopped in 12/ 2024) since October of 2022.   Patient presented to the ED ON 07/03/23 with complains of left hip pain and mid femur pain without any recent falls or radiation.    CT and bone scan demonstrated multiple areas of increased osseous uptake, most prominent in the mid left femur corresponding to her pain. Additional lesions were seen in the right fourth and fifth ribs, with possible sternum and spine lesions on CT that were not active on bone scan.   Most recent CT cap performed on 07/07/24 showed multiple new sclerotic lesions within the axial and proximal appendicular skeleton.  No  pathological fractures.  Bone scan revealedLeft femur midshaft lesion as well as new uptake right 4th or 5th rib lesions   She was seen inby Dr. Odean on 07/14/24 to follow up regarding her bone metastasis. Upon discussion, Dr. Gudena recommends switching her to Faslodex  injections and starting Xgeva.   PREVIOUS RADIATION THERAPY: Yes ***  PAST MEDICAL HISTORY:  has a past medical history of Cancer (HCC), Complication of anesthesia, Dyspnea, GERD (gastroesophageal reflux disease), Heart palpitations, Obesity, Personal history of chemotherapy, Personal history of radiation therapy, and Pre-diabetes.    PAST SURGICAL HISTORY: Past Surgical History:  Procedure Laterality Date   BREAST LUMPECTOMY     BREAST LUMPECTOMY WITH RADIOACTIVE SEED AND SENTINEL LYMPH NODE BIOPSY Right 08/09/2020   Procedure: RIGHT BREAST LUMPECTOMY WITH RADIOACTIVE SEED AND RIGHT BREAST RADIOACTIVE SEED TARGETED SENTINEL LYMPH NODE BIOPSY, RIGHT BREAST SENTINEL LYMPH NODE MAPPING;  Surgeon: Vanderbilt Ned, MD;  Location: [facility] OR;  Service: General;  Laterality: Right;  PEC BLOCK   BREAST SURGERY     CARDIAC CATHETERIZATION     CHOLECYSTECTOMY     DILATION AND CURETTAGE OF UTERUS     x1   HERNIA REPAIR     IR IMAGING GUIDED PORT INSERTION  08/29/2020   IR IMAGING GUIDED PORT INSERTION  08/29/2020   lumpectomy right breast     PORT-A-CATH REMOVAL N/A 04/05/2021   Procedure: PORT REMOVAL;  Surgeon: Vanderbilt Ned, MD;  Location: [facility] OR;  Service: General;  Laterality: N/A;    FAMILY HISTORY: family history is not on file. She was adopted.  SOCIAL HISTORY:  reports  that she has never smoked. She has never used smokeless tobacco. She reports that she does not drink alcohol and does not use drugs.  ALLERGIES: Grapefruit extract, Peach flavoring agent (non-screening), and Codeine  MEDICATIONS:  Current Outpatient Medications  Medication Sig Dispense Refill   ALPRAZolam  (XANAX ) 0.25 MG tablet Take 1 tablet (0.25 mg  total) by mouth at bedtime as needed for anxiety. 6 tablet 0   ciprofloxacin  (CIPRO ) 500 MG tablet Take 1 tablet (500 mg total) by mouth 2 (two) times daily. 14 tablet 0   gabapentin  (NEURONTIN ) 100 MG capsule Take 2 capsules (200 mg total) by mouth at bedtime. 180 capsule 3   letrozole  (FEMARA ) 2.5 MG tablet TAKE 1 TABLET BY MOUTH EVERY DAY 90 tablet 3   metFORMIN  (GLUCOPHAGE ) 500 MG tablet Take 1 tablet (500 mg total) by mouth 2 (two) times daily with a meal. 180 tablet 1   metoprolol  tartrate (LOPRESSOR ) 50 MG tablet Take 1 tablet (50 mg total) by mouth daily. 90 tablet 3   montelukast  (SINGULAIR ) 10 MG tablet Take 1 tablet (10 mg total) by mouth at bedtime. 90 tablet 3   pantoprazole  (PROTONIX ) 40 MG tablet Take 1 tablet (40 mg total) by mouth daily. 90 tablet 3   No current facility-administered medications for this visit.    REVIEW OF SYSTEMS: As above in HPI.   PHYSICAL EXAM:  vitals were not taken for this visit.   General: Alert and oriented, in no acute distress HEENT: Head is normocephalic. Extraocular movements are intact.  Heart: Regular in rate and rhythm with no murmurs, rubs, or gallops. Chest: Clear to auscultation bilaterally, with no rhonchi, wheezes, or rales. Abdomen: Soft, nontender, nondistended, with no rigidity or guarding. Extremities: No cyanosis or edema. Skin: No concerning lesions. Musculoskeletal: symmetric strength and muscle tone throughout. Neurologic: Cranial nerves II through XII are grossly intact. No obvious focalities. Speech is fluent. Coordination is intact. Psychiatric: Judgment and insight are intact. Affect is appropriate. Breasts: *** . No other palpable masses appreciated in the breasts or axillae *** .    ECOG = ***  0 - Asymptomatic (Fully active, able to carry on all predisease activities without restriction)  1 - Symptomatic but completely ambulatory (Restricted in physically strenuous activity but ambulatory and able to carry out  work of a light or sedentary nature. For example, light housework, office work)  2 - Symptomatic, <50% in bed during the day (Ambulatory and capable of all self care but unable to carry out any work activities. Up and about more than 50% of waking hours)  3 - Symptomatic, >50% in bed, but not bedbound (Capable of only limited self-care, confined to bed or chair 50% or more of waking hours)  4 - Bedbound (Completely disabled. Cannot carry on any self-care. Totally confined to bed or chair)  5 - Death   Raylene MM, Creech RH, Tormey DC, et al. [phone]). Toxicity and response criteria of the [facility]. Am. DOROTHA Bridges. Oncol. 5 (6): 649-55   LABORATORY DATA:   CBC    Component Value Date/Time   WBC 6.1 06/09/2023 0837   WBC 7.2 04/05/2021 1304   RBC 4.24 06/09/2023 0837   HGB 13.8 06/09/2023 0837   HGB 16.0 (H) 07/11/2020 1232   HCT 40.1 06/09/2023 0837   HCT 47.0 (H) 07/11/2020 1232   PLT 192 06/09/2023 0837   PLT 334 07/11/2020 1232   MCV 94.6 06/09/2023 0837   MCV 87 07/11/2020 1232   MCH  32.5 06/09/2023 0837   MCHC 34.4 06/09/2023 0837   RDW 13.1 06/09/2023 0837   RDW 12.7 07/11/2020 1232   LYMPHSABS 1.6 06/09/2023 0837   LYMPHSABS 1.8 07/11/2020 1232   MONOABS 0.4 06/09/2023 0837   EOSABS 0.1 06/09/2023 0837   EOSABS 0.1 07/11/2020 1232   BASOSABS 0.0 06/09/2023 0837   BASOSABS 0.1 07/11/2020 1232    CMP     Component Value Date/Time   NA 141 06/09/2023 0837   NA 145 (H) 07/11/2020 1232   K 4.2 06/09/2023 0837   CL 105 06/09/2023 0837   CO2 26 06/09/2023 0837   GLUCOSE 126 (H) 06/09/2023 0837   BUN 17 06/09/2023 0837   BUN 11 07/11/2020 1232   CREATININE 0.87 06/09/2023 0837   CALCIUM  9.3 06/09/2023 0837   PROT 7.1 06/09/2023 0837   PROT 7.4 07/11/2020 1232   ALBUMIN 3.7 06/09/2023 0837   ALBUMIN 4.6 07/11/2020 1232   AST 15 06/09/2023 0837   ALT 14 06/09/2023 0837   ALKPHOS 79 06/09/2023 0837   BILITOT 0.5 06/09/2023 0837   GFRNONAA  >60 06/09/2023 0837      RADIOGRAPHY: NM Bone Scan Whole Body Addendum Date: 07/13/2024 ADDENDUM REPORT: 07/13/2024 08:41 ADDENDUM: Patient had outside radiographs of the left hip, femur and knee on 07/02/2024, reporting a permeative, expansile lesion in the mid left femoral diaphysis, suspicious for metastatic disease. These are unavailable for direct comparison. Electronically Signed   By: Elsie Perone M.D.   On: 07/13/2024 08:41   Result Date: 07/13/2024 CLINICAL DATA:  Breast cancer, invasive, stage IV, initial workup Bone lesion detected on Xrays EXAM: NUCLEAR MEDICINE WHOLE BODY BONE SCAN TECHNIQUE: Whole body anterior and posterior images were obtained approximately 3 hours after intravenous injection of radiopharmaceutical. RADIOPHARMACEUTICALS:  20.5 mCi Technetium-39m MDP IV COMPARISON:  No recent radiographs available. Previous whole-body bone scan 08/30/2020. Chest CT 07/07/2024 and 07/19/2021. FINDINGS: There is new intense uptake within the mid left femoral shaft. There is also new bandlike uptake posteriorly in the right 4th or 5th rib, best seen on the posterior images. Questionable mildly increased uptake within the left humeral neck; comparison limited by differences in positioning. No other definite evidence of metastatic disease. Portions of the upper extremities are excluded from the field of view. The soft tissue uptake is within physiologic limits. IMPRESSION: 1. New intense uptake within the mid left femoral shaft suspicious for metastatic disease. Plain film correlation recommended. This could be further evaluated with CT or MRI as clinically warranted. 2. Additional new uptake posteriorly within the right 4th or 5th rib suspicious for metastatic disease. 3. See separate reports for CT of the chest, abdomen and pelvis. Electronically Signed: By: Elsie Perone M.D. On: 07/13/2024 08:23   CT CHEST ABDOMEN PELVIS W CONTRAST Result Date: 07/13/2024 CLINICAL DATA:  Breast cancer,  invasive, stage IV, initial workup Bone lesion detected on xrays. * Tracking Code: BO * EXAM: CT CHEST, ABDOMEN, AND PELVIS WITH CONTRAST TECHNIQUE: Multidetector CT imaging of the chest, abdomen and pelvis was performed following the standard protocol during bolus administration of intravenous contrast. RADIATION DOSE REDUCTION: This exam was performed according to the departmental dose-optimization program which includes automated exposure control, adjustment of the mA and/or kV according to patient size and/or use of iterative reconstruction technique. CONTRAST:  OMNIPAQUE  IOHEXOL  300 MG/ML  SOLN COMPARISON:  Same day whole-body bone scan. Chest CT 07/19/2021. CT of the chest, abdomen and pelvis 08/30/2020. FINDINGS: CT CHEST FINDINGS Cardiovascular: No acute vascular findings  are demonstrated. There is coronary artery and mild aortic atherosclerosis. The heart size is normal. There is no pericardial effusion. Mediastinum/Nodes: There are no enlarged mediastinal, hilar, axillary or internal mammary lymph nodes. Surgical clips noted in the right axilla. The thyroid gland, trachea and esophagus demonstrate no significant findings. Lungs/Pleura: There is no pleural effusion or pneumothorax. Interval decreased subpleural density anteriorly in the right upper and middle lobes compared with the most recent CT, most consistent with postradiation or postinflammatory scarring. No suspicious pulmonary nodules or confluent airspace opacities. Musculoskeletal/Chest wall: Multiple new small sclerotic lesions within the thoracic spine, ribs and sternum consistent with metastatic disease. No evidence of pathologic fracture. Postsurgical changes in the right breast without evidence of local recurrence. CT ABDOMEN AND PELVIS FINDINGS Hepatobiliary: The liver has a non cirrhotic morphology without focally suspicious abnormality. There is no significant biliary dilatation status post cholecystectomy. Pancreas: Unremarkable.  No pancreatic ductal dilatation or surrounding inflammatory changes. Spleen: Normal in size without focal abnormality. Adrenals/Urinary Tract: Both adrenal glands appear normal. No evidence of urinary tract calculus, suspicious renal lesion or hydronephrosis. Grossly stable small low-density right renal lesions, likely cysts. No specific follow-up imaging recommended. The bladder appears unremarkable for its degree of distention. Stomach/Bowel: No enteric contrast administered. The stomach appears unremarkable for its degree of distension. No evidence of bowel wall thickening, distention or surrounding inflammatory change. Vascular/Lymphatic: There are no enlarged abdominal or pelvic lymph nodes. Mild aortic and branch vessel atherosclerosis without acute vascular abnormality. Reproductive: The uterus and ovaries appear unremarkable. No adnexal mass. Other: No ascites or peritoneal nodularity. Musculoskeletal: New postsurgical changes superiorly in the anterior abdominal wall with associated dystrophic calcifications. Stable chronic postsurgical changes in the suprapubic region. Multiple new sclerotic lesions within the spine, bony pelvis and proximal femurs consistent with osseous metastatic disease. No pathologic fractures identified. IMPRESSION: 1. Multiple new sclerotic lesions within the axial and proximal appendicular skeleton consistent with osseous metastatic disease. No pathologic fractures identified. 2. No other evidence of metastatic disease within the chest, abdomen or pelvis. 3. Interval decreased subpleural density anteriorly in the right upper and middle lobes, most consistent with postradiation or postinflammatory scarring. 4.  Aortic Atherosclerosis (ICD10-I70.0). Electronically Signed   By: Elsie Perone M.D.   On: 07/13/2024 08:40      IMPRESSION/PLAN: ***   It was a pleasure meeting the patient today. We discussed the risks, benefits, and side effects of radiotherapy. I recommend  radiotherapy to the *** to reduce her risk of locoregional recurrence by 2/3.  We discussed that radiation would take approximately *** weeks to complete and that I would give the patient a few weeks to heal following surgery before starting treatment planning. *** If chemotherapy were to be given, this would precede radiotherapy. We spoke about acute effects including skin irritation and fatigue as well as much less common late effects including internal organ injury or irritation. We spoke about the latest technology that is used to minimize the risk of late effects for patients undergoing radiotherapy to the breast or chest wall. No guarantees of treatment were given. The patient is enthusiastic about proceeding with treatment. I look forward to participating in the patient's care.  I will await her referral back to me for postoperative follow-up and eventual CT simulation/treatment planning.  On date of service, in total, I spent *** minutes on this encounter. Patient was seen in person.   __________________________________________   Lauraine Golden, MD  This document serves as a record of services personally performed  by Lauraine Golden, MD. It was created on her behalf by Reymundo Cartwright, a trained medical scribe. The creation of this record is based on the scribe's personal observations and the provider's statements to them. This document has been checked and approved by the attending provider.  "

## 2024-07-13 NOTE — Progress Notes (Signed)
 Per MD request radiation oncology referral placed.

## 2024-07-13 NOTE — Assessment & Plan Note (Signed)
 07/04/2020:Screening mammogram detected right breast mass and right axillary lymph nodes.  Right breast nipple retraction and retroareolar thickening.  2.4 cm mass with 2 abnormal lymph nodes.  Biopsy revealed invasive lobular cancer grade 1-2 ER 95%, PR 95%, Ki-67 30%, HER2 equivocal by IHC negative by FISH ratio 1.39 T2N1 stage IIA   Right lumpectomy: 08/09/2020: ILC 3.8 cm, 19/19 LN Positive, Margins Neg   Plan: 1. Staging scans 2. Adjuvant chemo with TC X 4 (because of her propensity for diabetes we gave her 4 cycles) 3. XRT 01/25/2021- 03/07/21 4. Adj Anti estrogen therapy (with Verzenio ) started December 2022 (Verzinio completed December 2024) ------------------------------------------------------------------------------------------------------------------------------------ 08/30/2020: CT CAP: No evidence of metastatic disease 08/30/2020: Bone scan: No evidence of bone metastases --------------------------------------------------------------------------------------------------------------------  Anastrozole toxicities: Intermittent hot flashes Leg pain: Held anastrozole for 3 weeks   Breast Cancer Surveillance 1. Breast Exam: 06/10/2024: Benign 2. CT chest 07/19/21: Radiation fibrosis, Resolution of Rt Axillary LN, Hep steatosis 3. Mammogram 09/02/2023: Benign Density Cat B 4.  Recommended guardant reveal for MRD testing   Bone density 10/24/2021: T-score 0.4: Normal   Obesity: She lost 70 pounds on her own by eating 1200-calorie daily meals.   Chemo-induced peripheral neuropathy: Currently on gabapentin  200 mg daily.  X-ray suggestive of metastatic disease to the femur. CT CAP and bone scan 07/07/2024

## 2024-07-14 ENCOUNTER — Ambulatory Visit: Admitting: Radiation Oncology

## 2024-07-14 ENCOUNTER — Ambulatory Visit
Admission: RE | Admit: 2024-07-14 | Discharge: 2024-07-14 | Disposition: A | Discharge status: Home / Self Care | Source: Ambulatory Visit | Attending: Radiation Oncology | Admitting: Radiation Oncology

## 2024-07-14 ENCOUNTER — Other Ambulatory Visit: Payer: Self-pay

## 2024-07-14 ENCOUNTER — Encounter: Payer: Self-pay | Admitting: Radiation Oncology

## 2024-07-14 VITALS — BP 142/105 | HR 70 | Temp 97.8°F | Resp 18 | Ht 65.0 in | Wt 350.2 lb

## 2024-07-14 DIAGNOSIS — C7951 Secondary malignant neoplasm of bone: Secondary | ICD-10-CM | POA: Diagnosis present

## 2024-07-14 DIAGNOSIS — Z923 Personal history of irradiation: Secondary | ICD-10-CM | POA: Diagnosis not present

## 2024-07-14 DIAGNOSIS — Z1732 Human epidermal growth factor receptor 2 negative status: Secondary | ICD-10-CM | POA: Insufficient documentation

## 2024-07-14 DIAGNOSIS — Z79811 Long term (current) use of aromatase inhibitors: Secondary | ICD-10-CM | POA: Insufficient documentation

## 2024-07-14 DIAGNOSIS — C50111 Malignant neoplasm of central portion of right female breast: Secondary | ICD-10-CM | POA: Diagnosis not present

## 2024-07-14 DIAGNOSIS — Z170 Estrogen receptor positive status [ER+]: Secondary | ICD-10-CM | POA: Insufficient documentation

## 2024-07-14 DIAGNOSIS — I700 Atherosclerosis of aorta: Secondary | ICD-10-CM | POA: Insufficient documentation

## 2024-07-14 DIAGNOSIS — G893 Neoplasm related pain (acute) (chronic): Secondary | ICD-10-CM | POA: Insufficient documentation

## 2024-07-14 DIAGNOSIS — I2510 Atherosclerotic heart disease of native coronary artery without angina pectoris: Secondary | ICD-10-CM | POA: Insufficient documentation

## 2024-07-14 DIAGNOSIS — K219 Gastro-esophageal reflux disease without esophagitis: Secondary | ICD-10-CM | POA: Insufficient documentation

## 2024-07-14 DIAGNOSIS — Z9221 Personal history of antineoplastic chemotherapy: Secondary | ICD-10-CM | POA: Diagnosis not present

## 2024-07-14 DIAGNOSIS — Z79899 Other long term (current) drug therapy: Secondary | ICD-10-CM | POA: Insufficient documentation

## 2024-07-14 DIAGNOSIS — Z7984 Long term (current) use of oral hypoglycemic drugs: Secondary | ICD-10-CM | POA: Insufficient documentation

## 2024-07-14 DIAGNOSIS — Z1721 Progesterone receptor positive status: Secondary | ICD-10-CM | POA: Insufficient documentation

## 2024-07-14 DIAGNOSIS — Z780 Asymptomatic menopausal state: Secondary | ICD-10-CM | POA: Diagnosis not present

## 2024-07-14 NOTE — Progress Notes (Signed)
 Histology and Location of Primary Cancer:  Metastatic Estrogen Receptor Positive Breast Cancer and Osseous Metastasis  Sites of Visceral and Bony Metastatic Disease:  Femur, Multiple new Sclerotic Lesions within the Axial and Proximal Appendicular Skeleton. Bone scan revealed a left femur midshaft lesion including new uptake right 4th or 5th rib lesions.  Location(s) of Symptomatic Metastases:  Left Hip and Femur Pain  07/07/2024 Whole Body Bone Scan   Past/Anticipated chemotherapy by medical oncology, if any:  07/13/2024 Odean, MD  Pain on a scale of 0-10 is: None    If Spine Met(s), symptoms, if any, include: Bowel/Bladder retention or incontinence (please describe): None Numbness or weakness in extremities (please describe): A little bit of numbness on left leg Current Decadron  regimen, if applicable: None  Ambulatory status? Walker? Wheelchair?: None  SAFETY ISSUES: Prior radiation? Yes Pacemaker/ICD? None Possible current pregnancy? None Is the patient on methotrexate? None  Current Complaints / other details:  None

## 2024-07-15 ENCOUNTER — Telehealth: Payer: Self-pay

## 2024-07-15 ENCOUNTER — Telehealth: Payer: Self-pay | Admitting: *Deleted

## 2024-07-15 ENCOUNTER — Other Ambulatory Visit: Payer: Self-pay | Admitting: Hematology and Oncology

## 2024-07-15 MED ORDER — OXYCODONE-ACETAMINOPHEN 5-325 MG PO TABS: 1.0000 | ORAL_TABLET | Freq: Three times a day (TID) | ORAL | 0 refills | Status: AC | PRN

## 2024-07-15 NOTE — Telephone Encounter (Signed)
 Pt called regarding leg pain in Left Femur. Pt reported taking ibuprofen  yesterday, last night and this morning with little relief. Pt has consult scheduled w/ ortho next Tuesday. Plan is to receive radiation to help w/ pain but per MD orders will be placed for pain medication.

## 2024-07-15 NOTE — Telephone Encounter (Signed)
 Received call from pt with complaint of ongoing left femur pain not alleviated with OTC therapies.  RN reviewed with MD and MD sent in prescription for Percocet. Pt educated and verbalized understanding.

## 2024-07-15 NOTE — Telephone Encounter (Signed)
 Pt's daughter called w/ questions about the process of the pt's treatment plan as well as concerns over her femur pain. RN encouraged daughter to write down all  of the questions that she has and to join in the appt in February. Pt's daughter verbalized understanding and agreed with advice. RN also informed the daughter that Gudena MD would send in a medication for the leg pain. Pt daughter verbalized understanding.

## 2024-07-15 NOTE — Progress Notes (Signed)
 Patient called and complains of worsening eye pain. I sent a prescription for Percocets. She has an appointment with orthopedics with Dr. Vernetta and also for radiation oncology with Dr. Izell.

## 2024-07-15 NOTE — Telephone Encounter (Signed)
 error

## 2024-07-19 ENCOUNTER — Ambulatory Visit: Admitting: Physician Assistant

## 2024-07-19 ENCOUNTER — Other Ambulatory Visit (INDEPENDENT_AMBULATORY_CARE_PROVIDER_SITE_OTHER): Payer: Self-pay

## 2024-07-19 DIAGNOSIS — M898X5 Other specified disorders of bone, thigh: Secondary | ICD-10-CM

## 2024-07-19 NOTE — Progress Notes (Unsigned)
 "  Office Visit Note   Patient: Shelby [name]           Date of Birth: 06-08-1971           MRN: [id number] Visit Date: 07/19/2024              Requested by: Severa Rock HERO, FNP [address] [place],  [place] [address] PCP: Severa Rock HERO, FNP   Assessment & Plan: Visit Diagnoses:  1. Pain in left leg     Plan: Discussed with the patient at length that prophylactic nailing of the left femur does have potential for risk for progression of the osseous metastasis.  However there is a significant risk of femur fracture given the fact that she will be undergoing radiation to the area and the existing weakening of the cortices of the bone they are already seen on radiographs.  However at this point in time she would like to offload the hip with the use of a walker and continue to ride her scooter for mobility.  Will see her back in 4 weeks for repeat AP and lateral views of the femur sooner if there is any questions concerns.  Questions were encouraged and answered by Dr. Vernetta and myself.  Follow-Up Instructions: Return in about 4 weeks (around 08/16/2024) for Radiographs.   Orders:  Orders Placed This Encounter  Procedures   XR FEMUR MIN 2 VIEWS LEFT   No orders of the defined types were placed in this encounter.     Procedures: No procedures performed   Clinical Data: No additional findings.   Subjective: Chief Complaint  Patient presents with   Left Leg - Pain    HPI Patient is a 54 year old female were seen for the first time for left femur midshaft metastatic lesion.  Patient with metastatic estrogen receptor positive breast cancer.  She is seen for possible prophylactic treatment of the left femur midshaft metastatic lesion.  She is having left hip pain.  She has had no falls.  She does use a scooter to get around mostly transfers without any assistive device though. She has multiple questions about possible progression of the osseous metastasis with prophylactic nailing.   Presents today with her husband.  Review of Systems See HPI  Objective: Vital Signs: LMP 05/13/2016   Physical Exam Constitutional:      Appearance: She is not ill-appearing or diaphoretic.  Pulmonary:     Effort: Pulmonary effort is normal.  Neurological:     Mental Status: She is alert and oriented to person, place, and time.  Psychiatric:        Mood and Affect: Mood normal.   No range of motion of the left hip or leg was performed today.   Specialty Comments:  No specialty comments available.  Imaging: XR FEMUR MIN 2 VIEWS LEFT Result Date: 07/19/2024 Left femur 2 views: Hip and knee are well located.  Left hip well-preserved.  Left femur midshaft lytic lesion without significant thinning of cortices.  No acute fractures.    PMFS History: Patient Active Problem List   Diagnosis Date Noted   Metastasis to bone (HCC) 07/14/2024   Neuropathic pain 04/02/2022   Malignant neoplasm of central portion of right breast in female, estrogen receptor positive (HCC) 07/17/2020   Annual physical exam 07/11/2020   Wound check, abscess 07/11/2020   Allergic rhinitis 08/18/2017   Anxiety 08/18/2017   Nonhealing nonsurgical wound 05/13/2016   Gastroesophageal reflux disease without esophagitis 04/22/2016   Eustachian  tube dysfunction, bilateral 04/22/2016   Moderate single current episode of major depressive disorder (HCC) 04/22/2016   Essential hypertension 03/19/2016   Hypertriglyceridemia 03/19/2016   Morbid obesity (HCC) 03/19/2016   Palpitations 03/19/2016   Past Medical History:  Diagnosis Date   Cancer (HCC)    right breast   Complication of anesthesia    hard to wake up  per pt   Dyspnea    GERD (gastroesophageal reflux disease)    Heart palpitations    Obesity    Personal history of chemotherapy    Personal history of radiation therapy    Pre-diabetes     Family History  Adopted: Yes    Past Surgical History:  Procedure Laterality Date   BREAST  LUMPECTOMY     BREAST LUMPECTOMY WITH RADIOACTIVE SEED AND SENTINEL LYMPH NODE BIOPSY Right 08/09/2020   Procedure: RIGHT BREAST LUMPECTOMY WITH RADIOACTIVE SEED AND RIGHT BREAST RADIOACTIVE SEED TARGETED SENTINEL LYMPH NODE BIOPSY, RIGHT BREAST SENTINEL LYMPH NODE MAPPING;  Surgeon: Vanderbilt Ned, MD;  Location: [facility] OR;  Service: General;  Laterality: Right;  PEC BLOCK   BREAST SURGERY     CARDIAC CATHETERIZATION     CHOLECYSTECTOMY     DILATION AND CURETTAGE OF UTERUS     x1   HERNIA REPAIR     IR IMAGING GUIDED PORT INSERTION  08/29/2020   IR IMAGING GUIDED PORT INSERTION  08/29/2020   lumpectomy right breast     PORT-A-CATH REMOVAL N/A 04/05/2021   Procedure: PORT REMOVAL;  Surgeon: Vanderbilt Ned, MD;  Location: [facility] OR;  Service: General;  Laterality: N/A;   Social History   Occupational History   Not on file  Tobacco Use   Smoking status: Never   Smokeless tobacco: Never  Vaping Use   Vaping status: Never Used  Substance and Sexual Activity   Alcohol use: No   Drug use: No   Sexual activity: Yes    Partners: Male    Birth control/protection: Pill        "

## 2024-07-20 ENCOUNTER — Telehealth: Payer: Self-pay

## 2024-07-20 ENCOUNTER — Other Ambulatory Visit (HOSPITAL_COMMUNITY): Payer: Self-pay

## 2024-07-20 ENCOUNTER — Encounter: Payer: Self-pay | Admitting: Physician Assistant

## 2024-07-20 NOTE — Telephone Encounter (Signed)
 Oral Oncology Patient Advocate Encounter   Received notification that prior authorization for Oxy/Apap is required.   PA submitted on 07/20/2024 Key [id number] Status is pending      Charlott Hamilton,  CPhT-Adv  she/her/hers [facility]  [facility] Pharmacy Technician Patient Advocate Specialist III [facility] Phone: [phone]  Fax: [phone] [email or web address]

## 2024-07-26 ENCOUNTER — Ambulatory Visit: Admitting: Radiation Oncology

## 2024-07-27 ENCOUNTER — Encounter: Payer: Self-pay | Admitting: Hematology and Oncology

## 2024-07-27 ENCOUNTER — Inpatient Hospital Stay

## 2024-07-27 VITALS — BP 122/82 | HR 64 | Temp 98.5°F | Resp 18

## 2024-07-27 DIAGNOSIS — C50111 Malignant neoplasm of central portion of right female breast: Secondary | ICD-10-CM

## 2024-07-27 DIAGNOSIS — Z5111 Encounter for antineoplastic chemotherapy: Secondary | ICD-10-CM | POA: Diagnosis not present

## 2024-07-27 MED ORDER — FULVESTRANT 250 MG/5ML IM SOSY
500.0000 mg | PREFILLED_SYRINGE | Freq: Once | INTRAMUSCULAR | Status: AC
  Administered 2024-07-27: 500 mg via INTRAMUSCULAR
  Filled 2024-07-27: qty 10

## 2024-07-28 LAB — GUARDANT 360

## 2024-07-29 ENCOUNTER — Other Ambulatory Visit (HOSPITAL_COMMUNITY): Payer: Self-pay

## 2024-07-29 NOTE — Telephone Encounter (Signed)
 Oral Oncology Patient Advocate Encounter  Prior Authorization for Oxy/Apap has been approved.    Patients co-pay is $0.00.   Patient notified via MyChart  Charlott Hamilton,  CPhT-Adv  she/her/hers [facility]  [facility] Pharmacy Technician Patient Advocate Specialist III [facility] Phone: [phone]  Fax: [phone] [email or web address]

## 2024-07-30 ENCOUNTER — Encounter: Payer: Self-pay | Admitting: Radiation Oncology

## 2024-07-30 ENCOUNTER — Ambulatory Visit
Admission: RE | Admit: 2024-07-30 | Discharge: 2024-07-30 | Disposition: A | Source: Ambulatory Visit | Attending: Radiation Oncology | Admitting: Radiation Oncology

## 2024-07-30 DIAGNOSIS — C7951 Secondary malignant neoplasm of bone: Secondary | ICD-10-CM | POA: Insufficient documentation

## 2024-07-30 DIAGNOSIS — C50111 Malignant neoplasm of central portion of right female breast: Secondary | ICD-10-CM | POA: Insufficient documentation

## 2024-07-30 DIAGNOSIS — Z170 Estrogen receptor positive status [ER+]: Secondary | ICD-10-CM | POA: Insufficient documentation

## 2024-07-30 NOTE — Progress Notes (Signed)
 Today I spoke with the patient and she expressed anxiety and concern regarding her decision to defer surgery to her femur.  I then spoke with Dr. Vernetta so that we could make a multidisciplinary decision together.  Dr. Vernetta acknowledged that there are certainly some risks to surgery and he thinks it is reasonable for her to undergo radiation therapy alone and only pursue surgery if a fracture occurs.  I let the patient know about my discussion with Dr. Vernetta and she now feels better about proceeding with radiation.  She does have a lot of emotional distress and I have made a referral to spiritual care. -----------------------------------  Lauraine Golden, MD

## 2024-08-03 ENCOUNTER — Telehealth: Payer: Self-pay | Admitting: *Deleted

## 2024-08-03 ENCOUNTER — Encounter: Payer: Self-pay | Admitting: General Practice

## 2024-08-03 NOTE — Progress Notes (Signed)
 SPIRITUAL CARE AND COUNSELING CONSULT NOTE   VISIT SUMMARY Referred by Dr Izell for additional layer of support due to high distress and limited support.   SPIRITUAL ENCOUNTER                                                                                                                                                                      Type of Visit: Initial Care provided to:: Patient Referral source: Physician Reason for visit: Urgent spiritual support  SPIRITUAL FRAMEWORK  Presenting Themes: Courage hope and growth, Community and relationships, Significant life change Community/Connection: Significant other, Limited (Difficulty with loved ones' understanding of her diagnosis/treatment/prognosis) Strengths: Desire for support and connection Needs/Challenges/Barriers: Limited support that aligns with her needs Patient Stress Factors: Health changes, Lack of caregivers, Loss of control   GOALS   Self/Personal Goals: Refrain from internet searches re diagnosis/treatment/prognosis, despite family's assertions Clinical Care Goals: Hold space for reflection and connection   INTERVENTIONS   Spiritual Care Interventions Made: Established relationship of care and support, Compassionate presence, Reflective listening, Normalization of emotions, Explored values/beliefs/practices/strengths   INTERVENTION OUTCOMES   Outcomes: Connection to spiritual care, Awareness around self/spiritual resourses, Autonomy/agency, Awareness of support, Reduced isolation  SPIRITUAL CARE PLAN   Spiritual Care Issues Still Outstanding: Chaplain will continue to follow Follow up plan : Plan to follow up by phone on Monday 2/9 for Spiritual Care check-in and possible appointment scheduling    Chaplain Olam Corrigan, MDiv, [facility] Pager [phone] Voicemail [phone]

## 2024-08-03 NOTE — Telephone Encounter (Signed)
 Returned patient's phone call, spoke with patient

## 2024-08-04 ENCOUNTER — Ambulatory Visit
Admission: RE | Admit: 2024-08-04 | Discharge: 2024-08-04 | Disposition: A | Source: Ambulatory Visit | Attending: Radiation Oncology | Admitting: Radiation Oncology

## 2024-08-04 ENCOUNTER — Other Ambulatory Visit: Payer: Self-pay

## 2024-08-04 LAB — RAD ONC ARIA SESSION SUMMARY
Course Elapsed Days: 0
Plan Fractions Treated to Date: 1
Plan Prescribed Dose Per Fraction: 3 Gy
Plan Total Fractions Prescribed: 10
Plan Total Prescribed Dose: 30 Gy
Reference Point Dosage Given to Date: 3 Gy
Reference Point Session Dosage Given: 3 Gy
Session Number: 1

## 2024-08-05 ENCOUNTER — Other Ambulatory Visit: Payer: Self-pay

## 2024-08-05 ENCOUNTER — Ambulatory Visit
Admission: RE | Admit: 2024-08-05 | Discharge: 2024-08-05 | Disposition: A | Source: Ambulatory Visit | Attending: Radiation Oncology

## 2024-08-05 LAB — RAD ONC ARIA SESSION SUMMARY
Course Elapsed Days: 1
Plan Fractions Treated to Date: 2
Plan Prescribed Dose Per Fraction: 3 Gy
Plan Total Fractions Prescribed: 10
Plan Total Prescribed Dose: 30 Gy
Reference Point Dosage Given to Date: 6 Gy
Reference Point Session Dosage Given: 3 Gy
Session Number: 2

## 2024-08-06 ENCOUNTER — Other Ambulatory Visit: Payer: Self-pay

## 2024-08-06 ENCOUNTER — Ambulatory Visit: Admission: RE | Admit: 2024-08-06 | Source: Ambulatory Visit

## 2024-08-06 LAB — RAD ONC ARIA SESSION SUMMARY
Course Elapsed Days: 2
Plan Fractions Treated to Date: 3
Plan Prescribed Dose Per Fraction: 3 Gy
Plan Total Fractions Prescribed: 10
Plan Total Prescribed Dose: 30 Gy
Reference Point Dosage Given to Date: 9 Gy
Reference Point Session Dosage Given: 3 Gy
Session Number: 3

## 2024-08-09 ENCOUNTER — Ambulatory Visit

## 2024-08-10 ENCOUNTER — Ambulatory Visit

## 2024-08-10 ENCOUNTER — Inpatient Hospital Stay

## 2024-08-10 ENCOUNTER — Inpatient Hospital Stay: Attending: Hematology and Oncology | Admitting: Hematology and Oncology

## 2024-08-11 ENCOUNTER — Ambulatory Visit

## 2024-08-12 ENCOUNTER — Ambulatory Visit

## 2024-08-13 ENCOUNTER — Ambulatory Visit

## 2024-08-16 ENCOUNTER — Ambulatory Visit

## 2024-08-17 ENCOUNTER — Ambulatory Visit

## 2024-08-18 ENCOUNTER — Ambulatory Visit: Admitting: Orthopaedic Surgery

## 2025-06-09 ENCOUNTER — Inpatient Hospital Stay: Admitting: Hematology and Oncology
